# Patient Record
Sex: Female | Born: 1945 | Race: White | Hispanic: No | Marital: Married | Smoking: Current every day smoker
Health system: Southern US, Community
[De-identification: ages and names within clinical notes are randomized; demographics above are authoritative.]

## PROBLEM LIST (undated history)

## (undated) DIAGNOSIS — R32 Unspecified urinary incontinence: Secondary | ICD-10-CM

## (undated) DIAGNOSIS — D3001 Benign neoplasm of right kidney: Secondary | ICD-10-CM

## (undated) DIAGNOSIS — S92901A Unspecified fracture of right foot, initial encounter for closed fracture: Secondary | ICD-10-CM

## (undated) DIAGNOSIS — M549 Dorsalgia, unspecified: Secondary | ICD-10-CM

## (undated) DIAGNOSIS — F488 Other specified nonpsychotic mental disorders: Secondary | ICD-10-CM

## (undated) DIAGNOSIS — I10 Essential (primary) hypertension: Secondary | ICD-10-CM

## (undated) DIAGNOSIS — M170 Bilateral primary osteoarthritis of knee: Secondary | ICD-10-CM

## (undated) DIAGNOSIS — I341 Nonrheumatic mitral (valve) prolapse: Secondary | ICD-10-CM

## (undated) DIAGNOSIS — I951 Orthostatic hypotension: Secondary | ICD-10-CM

## (undated) DIAGNOSIS — I2510 Atherosclerotic heart disease of native coronary artery without angina pectoris: Secondary | ICD-10-CM

## (undated) DIAGNOSIS — J449 Chronic obstructive pulmonary disease, unspecified: Secondary | ICD-10-CM

## (undated) DIAGNOSIS — G8929 Other chronic pain: Secondary | ICD-10-CM

## (undated) DIAGNOSIS — K635 Polyp of colon: Secondary | ICD-10-CM

## (undated) DIAGNOSIS — T8859XA Other complications of anesthesia, initial encounter: Secondary | ICD-10-CM

## (undated) DIAGNOSIS — F419 Anxiety disorder, unspecified: Secondary | ICD-10-CM

## (undated) DIAGNOSIS — G894 Chronic pain syndrome: Secondary | ICD-10-CM

## (undated) DIAGNOSIS — E785 Hyperlipidemia, unspecified: Secondary | ICD-10-CM

## (undated) DIAGNOSIS — D369 Benign neoplasm, unspecified site: Secondary | ICD-10-CM

## (undated) DIAGNOSIS — F17200 Nicotine dependence, unspecified, uncomplicated: Secondary | ICD-10-CM

## (undated) DIAGNOSIS — J690 Pneumonitis due to inhalation of food and vomit: Secondary | ICD-10-CM

## (undated) DIAGNOSIS — R892 Abnormal level of other drugs, medicaments and biological substances in specimens from other organs, systems and tissues: Secondary | ICD-10-CM

## (undated) DIAGNOSIS — Z8673 Personal history of transient ischemic attack (TIA), and cerebral infarction without residual deficits: Secondary | ICD-10-CM

## (undated) DIAGNOSIS — L732 Hidradenitis suppurativa: Secondary | ICD-10-CM

## (undated) DIAGNOSIS — F445 Conversion disorder with seizures or convulsions: Secondary | ICD-10-CM

## (undated) DIAGNOSIS — T4145XA Adverse effect of unspecified anesthetic, initial encounter: Secondary | ICD-10-CM

## (undated) DIAGNOSIS — G039 Meningitis, unspecified: Secondary | ICD-10-CM

## (undated) DIAGNOSIS — D352 Benign neoplasm of pituitary gland: Secondary | ICD-10-CM

## (undated) DIAGNOSIS — M5136 Other intervertebral disc degeneration, lumbar region: Secondary | ICD-10-CM

## (undated) HISTORY — DX: Chronic pain syndrome: G89.4

## (undated) HISTORY — DX: Benign neoplasm, unspecified site: D36.9

## (undated) HISTORY — DX: Unspecified urinary incontinence: R32

## (undated) HISTORY — DX: Hidradenitis suppurativa: L73.2

## (undated) HISTORY — DX: Hyperlipidemia, unspecified: E78.5

## (undated) HISTORY — DX: Unspecified fracture of right foot, initial encounter for closed fracture: S92.901A

## (undated) HISTORY — DX: Abnormal level of other drugs, medicaments and biological substances in specimens from other organs, systems and tissues: R89.2

## (undated) HISTORY — DX: Polyp of colon: K63.5

## (undated) HISTORY — DX: Personal history of transient ischemic attack (TIA), and cerebral infarction without residual deficits: Z86.73

## (undated) HISTORY — DX: Nonrheumatic mitral (valve) prolapse: I34.1

## (undated) HISTORY — DX: Benign neoplasm of pituitary gland: D35.2

## (undated) HISTORY — DX: Atherosclerotic heart disease of native coronary artery without angina pectoris: I25.10

## (undated) HISTORY — DX: Conversion disorder with seizures or convulsions: F44.5

## (undated) HISTORY — DX: Chronic obstructive pulmonary disease, unspecified: J44.9

## (undated) HISTORY — PX: LAMINECTOMY: SHX219

## (undated) HISTORY — DX: Benign neoplasm of right kidney: D30.01

## (undated) HISTORY — DX: Nicotine dependence, unspecified, uncomplicated: F17.200

## (undated) HISTORY — DX: Anxiety disorder, unspecified: F41.9

## (undated) HISTORY — PX: KNEE ARTHROSCOPY: SUR90

## (undated) HISTORY — DX: Orthostatic hypotension: I95.1

## (undated) HISTORY — DX: Bilateral primary osteoarthritis of knee: M17.0

## (undated) HISTORY — DX: Other specified nonpsychotic mental disorders: F48.8

## (undated) HISTORY — DX: Other intervertebral disc degeneration, lumbar region: M51.36

## (undated) HISTORY — DX: Pneumonitis due to inhalation of food and vomit: J69.0

---
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
## 1967-05-25 DIAGNOSIS — F17200 Nicotine dependence, unspecified, uncomplicated: Secondary | ICD-10-CM

## 1967-05-25 HISTORY — DX: Nicotine dependence, unspecified, uncomplicated: F17.200

## 1968-05-24 DIAGNOSIS — G039 Meningitis, unspecified: Secondary | ICD-10-CM

## 1968-05-24 HISTORY — DX: Meningitis, unspecified: G03.9

## 1983-05-25 HISTORY — PX: ABDOMINAL HYSTERECTOMY: SHX81

## 1997-11-12 ENCOUNTER — Emergency Department (HOSPITAL_COMMUNITY): Admission: EM | Admit: 1997-11-12 | Discharge: 1997-11-12 | Payer: Self-pay | Admitting: Emergency Medicine

## 1998-01-10 ENCOUNTER — Encounter: Admission: RE | Admit: 1998-01-10 | Discharge: 1998-04-10 | Payer: Self-pay | Admitting: Anesthesiology

## 1998-01-24 ENCOUNTER — Emergency Department (HOSPITAL_COMMUNITY): Admission: EM | Admit: 1998-01-24 | Discharge: 1998-01-24 | Payer: Self-pay | Admitting: Emergency Medicine

## 1998-01-26 ENCOUNTER — Emergency Department (HOSPITAL_COMMUNITY): Admission: EM | Admit: 1998-01-26 | Discharge: 1998-01-26 | Payer: Self-pay | Admitting: Emergency Medicine

## 1998-02-24 ENCOUNTER — Encounter: Payer: Self-pay | Admitting: Anesthesiology

## 1998-03-28 ENCOUNTER — Emergency Department (HOSPITAL_COMMUNITY): Admission: EM | Admit: 1998-03-28 | Discharge: 1998-03-28 | Payer: Self-pay | Admitting: Emergency Medicine

## 1998-04-26 ENCOUNTER — Emergency Department (HOSPITAL_COMMUNITY): Admission: EM | Admit: 1998-04-26 | Discharge: 1998-04-26 | Payer: Self-pay

## 1998-06-24 ENCOUNTER — Emergency Department (HOSPITAL_COMMUNITY): Admission: EM | Admit: 1998-06-24 | Discharge: 1998-06-24 | Payer: Self-pay | Admitting: Emergency Medicine

## 1998-07-25 ENCOUNTER — Emergency Department (HOSPITAL_COMMUNITY): Admission: EM | Admit: 1998-07-25 | Discharge: 1998-07-25 | Payer: Self-pay | Admitting: Emergency Medicine

## 1998-08-14 ENCOUNTER — Emergency Department (HOSPITAL_COMMUNITY): Admission: EM | Admit: 1998-08-14 | Discharge: 1998-08-14 | Payer: Self-pay | Admitting: Emergency Medicine

## 1998-08-15 ENCOUNTER — Emergency Department (HOSPITAL_COMMUNITY): Admission: EM | Admit: 1998-08-15 | Discharge: 1998-08-15 | Payer: Self-pay

## 1998-10-10 ENCOUNTER — Ambulatory Visit (HOSPITAL_COMMUNITY): Admission: RE | Admit: 1998-10-10 | Discharge: 1998-10-10 | Payer: Self-pay | Admitting: *Deleted

## 1998-10-10 ENCOUNTER — Encounter: Payer: Self-pay | Admitting: *Deleted

## 1998-11-21 ENCOUNTER — Emergency Department (HOSPITAL_COMMUNITY): Admission: EM | Admit: 1998-11-21 | Discharge: 1998-11-21 | Payer: Self-pay | Admitting: Emergency Medicine

## 1998-12-09 ENCOUNTER — Encounter: Payer: Self-pay | Admitting: Emergency Medicine

## 1998-12-09 ENCOUNTER — Emergency Department (HOSPITAL_COMMUNITY): Admission: EM | Admit: 1998-12-09 | Discharge: 1998-12-09 | Payer: Self-pay | Admitting: Emergency Medicine

## 1999-01-05 ENCOUNTER — Emergency Department (HOSPITAL_COMMUNITY): Admission: EM | Admit: 1999-01-05 | Discharge: 1999-01-05 | Payer: Self-pay | Admitting: Emergency Medicine

## 1999-01-10 ENCOUNTER — Emergency Department (HOSPITAL_COMMUNITY): Admission: EM | Admit: 1999-01-10 | Discharge: 1999-01-10 | Payer: Self-pay | Admitting: Emergency Medicine

## 1999-02-25 ENCOUNTER — Inpatient Hospital Stay (HOSPITAL_COMMUNITY): Admission: EM | Admit: 1999-02-25 | Discharge: 1999-02-28 | Payer: Self-pay | Admitting: Emergency Medicine

## 1999-02-25 ENCOUNTER — Encounter: Payer: Self-pay | Admitting: Emergency Medicine

## 1999-03-21 ENCOUNTER — Emergency Department (HOSPITAL_COMMUNITY): Admission: EM | Admit: 1999-03-21 | Discharge: 1999-03-21 | Payer: Self-pay | Admitting: Emergency Medicine

## 1999-05-25 DIAGNOSIS — L732 Hidradenitis suppurativa: Secondary | ICD-10-CM

## 1999-05-25 HISTORY — DX: Hidradenitis suppurativa: L73.2

## 1999-08-04 ENCOUNTER — Encounter: Payer: Self-pay | Admitting: Emergency Medicine

## 1999-08-04 ENCOUNTER — Emergency Department (HOSPITAL_COMMUNITY): Admission: EM | Admit: 1999-08-04 | Discharge: 1999-08-04 | Payer: Self-pay | Admitting: Emergency Medicine

## 1999-12-18 ENCOUNTER — Ambulatory Visit (HOSPITAL_COMMUNITY): Admission: RE | Admit: 1999-12-18 | Discharge: 1999-12-18 | Payer: Self-pay | Admitting: General Surgery

## 1999-12-18 ENCOUNTER — Encounter (INDEPENDENT_AMBULATORY_CARE_PROVIDER_SITE_OTHER): Payer: Self-pay

## 2000-05-24 DIAGNOSIS — I2510 Atherosclerotic heart disease of native coronary artery without angina pectoris: Secondary | ICD-10-CM

## 2000-05-24 DIAGNOSIS — I951 Orthostatic hypotension: Secondary | ICD-10-CM

## 2000-05-24 HISTORY — DX: Atherosclerotic heart disease of native coronary artery without angina pectoris: I25.10

## 2000-05-24 HISTORY — DX: Orthostatic hypotension: I95.1

## 2000-09-07 ENCOUNTER — Encounter: Payer: Self-pay | Admitting: Emergency Medicine

## 2000-09-08 ENCOUNTER — Encounter: Payer: Self-pay | Admitting: Internal Medicine

## 2000-09-08 ENCOUNTER — Inpatient Hospital Stay (HOSPITAL_COMMUNITY): Admission: EM | Admit: 2000-09-08 | Discharge: 2000-09-08 | Payer: Self-pay

## 2000-09-21 ENCOUNTER — Emergency Department (HOSPITAL_COMMUNITY): Admission: EM | Admit: 2000-09-21 | Discharge: 2000-09-21 | Payer: Self-pay | Admitting: Emergency Medicine

## 2000-10-04 ENCOUNTER — Emergency Department (HOSPITAL_COMMUNITY): Admission: EM | Admit: 2000-10-04 | Discharge: 2000-10-04 | Payer: Self-pay | Admitting: Emergency Medicine

## 2000-11-29 ENCOUNTER — Inpatient Hospital Stay (HOSPITAL_COMMUNITY): Admission: EM | Admit: 2000-11-29 | Discharge: 2000-12-01 | Payer: Self-pay | Admitting: Emergency Medicine

## 2000-11-29 ENCOUNTER — Encounter: Payer: Self-pay | Admitting: Emergency Medicine

## 2000-11-29 ENCOUNTER — Encounter: Payer: Self-pay | Admitting: Family Medicine

## 2000-11-30 ENCOUNTER — Encounter: Payer: Self-pay | Admitting: Internal Medicine

## 2000-12-01 ENCOUNTER — Encounter: Payer: Self-pay | Admitting: Family Medicine

## 2001-01-22 ENCOUNTER — Emergency Department (HOSPITAL_COMMUNITY): Admission: EM | Admit: 2001-01-22 | Discharge: 2001-01-22 | Payer: Self-pay | Admitting: Emergency Medicine

## 2001-02-04 ENCOUNTER — Emergency Department (HOSPITAL_COMMUNITY): Admission: EM | Admit: 2001-02-04 | Discharge: 2001-02-04 | Payer: Self-pay | Admitting: Emergency Medicine

## 2001-05-24 DIAGNOSIS — M51369 Other intervertebral disc degeneration, lumbar region without mention of lumbar back pain or lower extremity pain: Secondary | ICD-10-CM

## 2001-05-24 DIAGNOSIS — M5136 Other intervertebral disc degeneration, lumbar region: Secondary | ICD-10-CM

## 2001-05-24 DIAGNOSIS — J449 Chronic obstructive pulmonary disease, unspecified: Secondary | ICD-10-CM

## 2001-05-24 DIAGNOSIS — F488 Other specified nonpsychotic mental disorders: Secondary | ICD-10-CM

## 2001-05-24 HISTORY — DX: Other intervertebral disc degeneration, lumbar region without mention of lumbar back pain or lower extremity pain: M51.369

## 2001-05-24 HISTORY — DX: Chronic obstructive pulmonary disease, unspecified: J44.9

## 2001-05-24 HISTORY — DX: Other specified nonpsychotic mental disorders: F48.8

## 2001-05-24 HISTORY — PX: LUMBAR FUSION: SHX111

## 2001-05-24 HISTORY — DX: Other intervertebral disc degeneration, lumbar region: M51.36

## 2001-05-27 ENCOUNTER — Emergency Department (HOSPITAL_COMMUNITY): Admission: EM | Admit: 2001-05-27 | Discharge: 2001-05-27 | Payer: Self-pay | Admitting: Emergency Medicine

## 2001-05-28 ENCOUNTER — Inpatient Hospital Stay (HOSPITAL_COMMUNITY): Admission: EM | Admit: 2001-05-28 | Discharge: 2001-06-19 | Payer: Self-pay | Admitting: Emergency Medicine

## 2001-05-28 ENCOUNTER — Encounter: Payer: Self-pay | Admitting: Neurology

## 2001-06-02 ENCOUNTER — Encounter: Payer: Self-pay | Admitting: Neurology

## 2001-06-08 ENCOUNTER — Encounter: Payer: Self-pay | Admitting: Neurosurgery

## 2001-06-09 ENCOUNTER — Encounter: Payer: Self-pay | Admitting: Neurology

## 2001-06-11 ENCOUNTER — Encounter: Payer: Self-pay | Admitting: Neurology

## 2001-08-02 ENCOUNTER — Ambulatory Visit (HOSPITAL_COMMUNITY): Admission: RE | Admit: 2001-08-02 | Discharge: 2001-08-02 | Payer: Self-pay | Admitting: Neurosurgery

## 2001-08-02 ENCOUNTER — Encounter: Payer: Self-pay | Admitting: Neurosurgery

## 2001-09-20 ENCOUNTER — Inpatient Hospital Stay (HOSPITAL_COMMUNITY): Admission: EM | Admit: 2001-09-20 | Discharge: 2001-09-22 | Payer: Self-pay | Admitting: Emergency Medicine

## 2001-10-19 ENCOUNTER — Emergency Department (HOSPITAL_COMMUNITY): Admission: EM | Admit: 2001-10-19 | Discharge: 2001-10-19 | Payer: Self-pay | Admitting: Emergency Medicine

## 2001-10-19 ENCOUNTER — Encounter: Payer: Self-pay | Admitting: Emergency Medicine

## 2001-12-20 ENCOUNTER — Ambulatory Visit (HOSPITAL_BASED_OUTPATIENT_CLINIC_OR_DEPARTMENT_OTHER): Admission: RE | Admit: 2001-12-20 | Discharge: 2001-12-20 | Payer: Self-pay | Admitting: General Surgery

## 2001-12-20 ENCOUNTER — Encounter (INDEPENDENT_AMBULATORY_CARE_PROVIDER_SITE_OTHER): Payer: Self-pay | Admitting: Specialist

## 2002-03-03 ENCOUNTER — Emergency Department (HOSPITAL_COMMUNITY): Admission: EM | Admit: 2002-03-03 | Discharge: 2002-03-03 | Payer: Self-pay | Admitting: Emergency Medicine

## 2002-03-03 ENCOUNTER — Emergency Department (HOSPITAL_COMMUNITY): Admission: EM | Admit: 2002-03-03 | Discharge: 2002-03-03 | Payer: Self-pay | Admitting: *Deleted

## 2002-03-04 ENCOUNTER — Inpatient Hospital Stay (HOSPITAL_COMMUNITY): Admission: AD | Admit: 2002-03-04 | Discharge: 2002-03-15 | Payer: Self-pay | Admitting: Neurology

## 2002-03-08 ENCOUNTER — Encounter: Payer: Self-pay | Admitting: *Deleted

## 2002-05-24 HISTORY — PX: CARDIAC CATHETERIZATION: SHX172

## 2002-07-02 ENCOUNTER — Emergency Department (HOSPITAL_COMMUNITY): Admission: EM | Admit: 2002-07-02 | Discharge: 2002-07-02 | Payer: Self-pay | Admitting: Emergency Medicine

## 2002-07-02 ENCOUNTER — Encounter: Payer: Self-pay | Admitting: Emergency Medicine

## 2002-11-28 ENCOUNTER — Inpatient Hospital Stay (HOSPITAL_COMMUNITY): Admission: AD | Admit: 2002-11-28 | Discharge: 2002-12-01 | Payer: Self-pay | Admitting: Pediatrics

## 2002-11-29 ENCOUNTER — Encounter: Payer: Self-pay | Admitting: Pediatrics

## 2002-12-24 ENCOUNTER — Emergency Department (HOSPITAL_COMMUNITY): Admission: EM | Admit: 2002-12-24 | Discharge: 2002-12-24 | Payer: Self-pay | Admitting: Emergency Medicine

## 2002-12-24 ENCOUNTER — Encounter: Payer: Self-pay | Admitting: Emergency Medicine

## 2002-12-25 ENCOUNTER — Emergency Department (HOSPITAL_COMMUNITY): Admission: EM | Admit: 2002-12-25 | Discharge: 2002-12-25 | Payer: Self-pay | Admitting: Emergency Medicine

## 2002-12-26 ENCOUNTER — Emergency Department (HOSPITAL_COMMUNITY): Admission: EM | Admit: 2002-12-26 | Discharge: 2002-12-26 | Payer: Self-pay | Admitting: Emergency Medicine

## 2002-12-26 ENCOUNTER — Encounter: Payer: Self-pay | Admitting: Pediatrics

## 2002-12-26 ENCOUNTER — Ambulatory Visit (HOSPITAL_COMMUNITY): Admission: RE | Admit: 2002-12-26 | Discharge: 2002-12-26 | Payer: Self-pay | Admitting: Pediatrics

## 2002-12-28 ENCOUNTER — Emergency Department (HOSPITAL_COMMUNITY): Admission: EM | Admit: 2002-12-28 | Discharge: 2002-12-28 | Payer: Self-pay | Admitting: Emergency Medicine

## 2002-12-28 ENCOUNTER — Encounter: Payer: Self-pay | Admitting: Emergency Medicine

## 2002-12-29 ENCOUNTER — Emergency Department (HOSPITAL_COMMUNITY): Admission: AD | Admit: 2002-12-29 | Discharge: 2002-12-29 | Payer: Self-pay | Admitting: Emergency Medicine

## 2003-04-03 ENCOUNTER — Emergency Department (HOSPITAL_COMMUNITY): Admission: EM | Admit: 2003-04-03 | Discharge: 2003-04-03 | Payer: Self-pay | Admitting: Emergency Medicine

## 2003-08-06 ENCOUNTER — Emergency Department (HOSPITAL_COMMUNITY): Admission: EM | Admit: 2003-08-06 | Discharge: 2003-08-06 | Payer: Self-pay | Admitting: Emergency Medicine

## 2005-04-30 ENCOUNTER — Emergency Department (HOSPITAL_COMMUNITY): Admission: EM | Admit: 2005-04-30 | Discharge: 2005-04-30 | Payer: Self-pay | Admitting: Emergency Medicine

## 2005-06-23 ENCOUNTER — Encounter: Admission: RE | Admit: 2005-06-23 | Discharge: 2005-06-23 | Payer: Self-pay | Admitting: Orthopedic Surgery

## 2005-09-13 ENCOUNTER — Ambulatory Visit (HOSPITAL_COMMUNITY): Admission: RE | Admit: 2005-09-13 | Discharge: 2005-09-13 | Payer: Self-pay | Admitting: Pediatrics

## 2005-10-07 ENCOUNTER — Emergency Department (HOSPITAL_COMMUNITY): Admission: EM | Admit: 2005-10-07 | Discharge: 2005-10-07 | Payer: Self-pay | Admitting: Emergency Medicine

## 2006-02-24 ENCOUNTER — Emergency Department (HOSPITAL_COMMUNITY): Admission: EM | Admit: 2006-02-24 | Discharge: 2006-02-24 | Payer: Self-pay | Admitting: Emergency Medicine

## 2006-11-22 ENCOUNTER — Ambulatory Visit (HOSPITAL_COMMUNITY): Admission: RE | Admit: 2006-11-22 | Discharge: 2006-11-22 | Payer: Self-pay | Admitting: Family Medicine

## 2007-06-04 ENCOUNTER — Emergency Department (HOSPITAL_COMMUNITY): Admission: EM | Admit: 2007-06-04 | Discharge: 2007-06-04 | Payer: Self-pay | Admitting: Emergency Medicine

## 2007-09-09 ENCOUNTER — Emergency Department (HOSPITAL_COMMUNITY): Admission: EM | Admit: 2007-09-09 | Discharge: 2007-09-10 | Payer: Self-pay | Admitting: Emergency Medicine

## 2007-09-10 ENCOUNTER — Emergency Department (HOSPITAL_COMMUNITY): Admission: EM | Admit: 2007-09-10 | Discharge: 2007-09-10 | Payer: Self-pay | Admitting: Emergency Medicine

## 2007-10-09 ENCOUNTER — Observation Stay (HOSPITAL_COMMUNITY): Admission: EM | Admit: 2007-10-09 | Discharge: 2007-10-10 | Payer: Self-pay | Admitting: Emergency Medicine

## 2007-11-08 ENCOUNTER — Emergency Department (HOSPITAL_COMMUNITY): Admission: EM | Admit: 2007-11-08 | Discharge: 2007-11-08 | Payer: Self-pay | Admitting: Emergency Medicine

## 2008-04-10 ENCOUNTER — Ambulatory Visit (HOSPITAL_COMMUNITY): Admission: RE | Admit: 2008-04-10 | Discharge: 2008-04-10 | Payer: Self-pay | Admitting: Family Medicine

## 2008-04-23 DIAGNOSIS — D369 Benign neoplasm, unspecified site: Secondary | ICD-10-CM

## 2008-04-23 HISTORY — PX: COLONOSCOPY: SHX174

## 2008-04-23 HISTORY — DX: Benign neoplasm, unspecified site: D36.9

## 2008-10-15 ENCOUNTER — Encounter: Admission: RE | Admit: 2008-10-15 | Discharge: 2008-10-15 | Payer: Self-pay | Admitting: Family Medicine

## 2008-10-24 ENCOUNTER — Emergency Department (HOSPITAL_COMMUNITY): Admission: EM | Admit: 2008-10-24 | Discharge: 2008-10-24 | Payer: Self-pay | Admitting: *Deleted

## 2009-07-03 ENCOUNTER — Emergency Department (HOSPITAL_COMMUNITY): Admission: EM | Admit: 2009-07-03 | Discharge: 2009-07-03 | Payer: Self-pay | Admitting: Emergency Medicine

## 2009-07-04 ENCOUNTER — Ambulatory Visit (HOSPITAL_COMMUNITY): Admission: RE | Admit: 2009-07-04 | Discharge: 2009-07-04 | Payer: Self-pay | Admitting: Pediatrics

## 2009-12-26 ENCOUNTER — Emergency Department (HOSPITAL_COMMUNITY): Admission: EM | Admit: 2009-12-26 | Discharge: 2009-12-27 | Payer: Self-pay | Admitting: Emergency Medicine

## 2010-05-24 DIAGNOSIS — S92901A Unspecified fracture of right foot, initial encounter for closed fracture: Secondary | ICD-10-CM

## 2010-05-24 HISTORY — DX: Unspecified fracture of right foot, initial encounter for closed fracture: S92.901A

## 2010-06-14 ENCOUNTER — Encounter: Payer: Self-pay | Admitting: Pediatrics

## 2010-06-17 ENCOUNTER — Encounter
Admission: RE | Admit: 2010-06-17 | Discharge: 2010-06-17 | Payer: Self-pay | Source: Home / Self Care | Attending: Family Medicine | Admitting: Family Medicine

## 2010-08-07 LAB — CBC
Hemoglobin: 13.6 g/dL (ref 12.0–15.0)
MCHC: 34.1 g/dL (ref 30.0–36.0)
RBC: 4.37 MIL/uL (ref 3.87–5.11)
WBC: 8.8 10*3/uL (ref 4.0–10.5)

## 2010-08-07 LAB — DIFFERENTIAL
Basophils Relative: 0 % (ref 0–1)
Lymphocytes Relative: 34 % (ref 12–46)
Lymphs Abs: 3 10*3/uL (ref 0.7–4.0)
Monocytes Relative: 7 % (ref 3–12)
Neutro Abs: 4.4 10*3/uL (ref 1.7–7.7)
Neutrophils Relative %: 51 % (ref 43–77)

## 2010-08-07 LAB — BASIC METABOLIC PANEL
BUN: 28 mg/dL — ABNORMAL HIGH (ref 6–23)
Chloride: 105 mEq/L (ref 96–112)
Creatinine, Ser: 1.62 mg/dL — ABNORMAL HIGH (ref 0.4–1.2)
GFR calc non Af Amer: 32 mL/min — ABNORMAL LOW (ref >60)

## 2010-08-14 LAB — CREATININE, SERUM
Creatinine, Ser: 1.62 mg/dL — ABNORMAL HIGH (ref 0.4–1.2)
GFR calc non Af Amer: 32 mL/min — ABNORMAL LOW (ref >60)

## 2010-08-31 LAB — URINALYSIS, ROUTINE W REFLEX MICROSCOPIC
Bilirubin Urine: NEGATIVE
Ketones, ur: NEGATIVE mg/dL
Nitrite: NEGATIVE
Specific Gravity, Urine: 1.018 (ref 1.005–1.030)
Urobilinogen, UA: 0.2 mg/dL (ref 0.0–1.0)

## 2010-08-31 LAB — CBC
HCT: 40.1 % (ref 36.0–46.0)
Hemoglobin: 13.6 g/dL (ref 12.0–15.0)
WBC: 7.2 10*3/uL (ref 4.0–10.5)

## 2010-08-31 LAB — URINE CULTURE

## 2010-08-31 LAB — URINE MICROSCOPIC-ADD ON

## 2010-08-31 LAB — BASIC METABOLIC PANEL
GFR calc non Af Amer: 43 mL/min — ABNORMAL LOW (ref >60)
Potassium: 3.8 mEq/L (ref 3.5–5.1)
Sodium: 135 mEq/L (ref 135–145)

## 2010-08-31 LAB — DIFFERENTIAL
Eosinophils Relative: 2 % (ref 0–5)
Lymphocytes Relative: 40 % (ref 12–46)
Lymphs Abs: 2.9 10*3/uL (ref 0.7–4.0)
Monocytes Absolute: 0.4 10*3/uL (ref 0.1–1.0)

## 2010-08-31 LAB — POCT CARDIAC MARKERS: Troponin i, poc: <0.05 ng/mL (ref 0.00–0.09)

## 2010-10-06 NOTE — H&P (Signed)
[name] [name], [name] [name]                 ACCOUNT NO.:  [id number]   MEDICAL RECORD NO.:  [id number]          PATIENT TYPE:  OBV   LOCATION:  0103                         FACILITY:  [facility]   PHYSICIAN:  Hollice Espy, M.D.DATE OF BIRTH:  24-Sep-1945   DATE OF ADMISSION:  10/09/2007  DATE OF DISCHARGE:                              HISTORY & PHYSICAL   PRIMARY CARE PHYSICIAN:  Robert L. Foy Guadalajara, M.D.   NEUROLOGIST:  Deanna Artis. Sharene Skeans, M.D.   CHIEF COMPLAINT:  Blurry vision.   HISTORY OF PRESENT ILLNESS:  The patient is a 66 year old white female.  Past medical history of chronic pain, anxiety, arachnoiditis and  hypertension who reports that she slipped and fell 4 days back, hitting  the back of her head. She says that she had a worsening headache since  then.  Note, that she has had a headache since then and she was noting  some blurry vision today when she came in. She describes her pain/her  headache as bitemporal and dull, throbbing and continuous, worsening in  nature up to a 9/10 today and the blurry vision is of more sudden onset  today. She became concerned and came in to the emergency room. In the  emergency room, she was noted initially to have a blood pressure of  167/113 with a heart rate 105. Over time it has trended up to as high as  172/96. A CT scan of the head was unremarkable for anything acute. Her  lab work was drawn and was also found to be completely unremarkable.  Because of the reports of blurry vision and elevated blood pressure, I  felt best that she come in for further evaluation in person.  The  patient herself is feeling quite anxious and nervous about everything  that is going on and she denies any dysphagia, no chest pain,  palpitations and no shortness of breath, wheezing or coughing.  No  abdominal pain.  No hematuria or dysuria, no constipation, diarrhea, no  focal extremity numbness, weakness or pain, but she notes she has  chronic back pain all  throughout her entire back, which has been ongoing  for quite a long time.  Her review of systems otherwise negative.   PAST MEDICAL HISTORY:  1. Hypertension.  2. Arachnoiditis.  3. Anxiety disorder.  4. Dyslipidemia.  5. Depression.  6. Based on her CT, she has an old history of stroke.  7. Tobacco abuse.   MEDICATIONS:  She tells me that she is on following medications:  1. Somewhat 350 p.o. four times a day.  2. Methadone 50 mg p.o. four times a day.  3. Xanax 1 mg p.o. four times a day. These medications she says are      all prescribed by Dr. Sharene Skeans.  4. The rest of medications are done by Dr. Foy Guadalajara. Lisinopril 40 mg      p.o. daily.  5. Another p.r.n. antihypertensive medication for when her systolic      blood pressure gets over 160.   SHE HAS REPORTED ALLERGIES TO TYLENOL, CODEINE, PENICILLIN, SULFA, and  TORADOL.  SOCIAL HISTORY:  She smokes about a little under a pack a day.  Denies  any alcohol or drug use.   FAMILY HISTORY:  Noncontributory.   PHYSICAL EXAMINATION:  VITALS ON ADMISSION: Temperature 98.2, heart rate  105, blood pressure 167/113.  since that time, her blood pressure has  slightly trended up to 172/96 and her  heart rate has come down to 67,  respirations 18, O2 saturation 99% on 2 liters.  She was given a dose of  morphine 4 units, Dilaudid 2 units and Ativan 1 mg and clonidine 0.1 in  the emergency room.  HEENT: Normocephalic, atraumatic.  Her cranial nerves actually appear to  be intact and her vision loss is completely subjective. Her mucous  membranes are slightly dry.  She has no carotid bruits.  HEART:  Regular rhythm, S1-S2.  LUNGS:  Clear to auscultation bilaterally.  ABDOMEN:  Soft, nontender, nondistended.  Positive bowel sounds.  EXTREMITIES:  Show no clubbing, cyanosis or edema.  NEUROLOGIC:  Her neurological examination really shows a diminished  musculoskeletal examination, but again much more subjective. It is  certainly not  focal.  Her grip extension and flexion all appear to be 5-  to a 4+/5, but again is symmetric and more subjective. She has no loss  of sensation.   LAB WORK:  Was sodium of 142, potassium 4, chloride 107, bicarb 29, BUN  not done, creatinine 0.97, glucose of 120. CPK 62.8 and MB less than  less than 0.05. Her BUN was 5.  CT scan of the head shows no acute  abnormality, mild diffuse cortical atrophy,  minimal chronic small  vessel white matter,  ischemic changes in both cerebral hemispheres and  a small old right thalamic lacunar infarct.   PLAN:  1. Hypertensive urgency. Given her previous strokes, do need to take      this seriously.  Will go ahead and check MRI, MRA of the head with      and without contrast and monitor her blood pressure. Will also      gently bring her blood pressure down with clonidine and p.r.n. beta      blocker.  Once MRI is negative, we can more aggressively get on her      blood pressure.  If she has vision changes again, check MRI,  but      exam is inconsistent and may today be from increased blood pressure      versus hysteria component.  2. Tobacco abuse, nicotine patch.  3. Old stroke based on CT.  She needs to be on prophylactic measures.      Will check a fasting lipid profile in the morning and homocysteine      level.  4. Tobacco abuse.  The patient accepts a nicotine patch.  5. Chronic back pain, anxiety.  Will prescribe her medicines as what      the dose she has told me in terms of her Soma, methadone and Xanax      and confirm with Dr. Sharene Skeans in the morning in the office if these      doses are indeed accurate.      Hollice Espy, M.D.  Electronically Signed     SKK/MEDQ  D:  10/09/2007  T:  10/09/2007  Job:  [id number]   cc:   Deanna Artis. Sharene Skeans, M.D.  Fax: [phone]   Doris Cheadle. Foy Guadalajara, M.D.  Fax: [phone]

## 2010-10-06 NOTE — Discharge Summary (Signed)
[name] [name], [name] [name]                 ACCOUNT NO.:  [id number]   MEDICAL RECORD NO.:  [id number]          PATIENT TYPE:  OBV   LOCATION:  1445                         FACILITY:  [facility]   PHYSICIAN:  Hollice Espy, M.D.DATE OF BIRTH:  31-Jan-1946   DATE OF ADMISSION:  10/09/2007  DATE OF DISCHARGE:  10/10/2007                               DISCHARGE SUMMARY   PRIMARY CARE PHYSICIAN:  Dr.Bo Foy Guadalajara   NEUROLOGIST:  Dr. Billie Ruddy.   DISCHARGE DIAGNOSES:  1. Malignant hypertension.  2. 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  3. Documented old cerebrovascular accident, new diagnosis.  4. Tobacco abuse.  5. Chronic pain.  6. Anxiety disorder.   DISCHARGE MEDICATIONS:  New medication for this patient is clonidine 0.1  mg p.o. b.i.d.  The patient is going to continue all of her previous  medications.  These doses are as per what she has told me.  I have  attempted to try to confirm this with Dr. Darl Householder office.  I was  unable to do so.  She tells me that she is on Soma 350 mg p.o. 4 times a  day, methadone 50 mg p.o. 4 times a day, Xanax 1 mg p.o. 4 times a day,  lisinopril 40 mg p.o. daily and aspirin 81 p.o. daily.   HOSPITAL COURSE:  The patient is a 65 year old white female who  presented to the emergency room on Oct 09, 2007 complaining of severe  headache.  She said she had fallen and hit the back of her head 4 days  prior and that her pain continued to worsen during that time.  She said  that she came to the emergency room 4 days later because she was having  severe blurry vision where she felt like she could barely see.  Her exam  was unremarkable, including her eye exam.  A CT scan done of the head  showed evidence of an old thalamic infarct which was for her a new  diagnosis, and her blood pressure was noted to be in the 170s.  The  patient was given doses of pain and antihypertensive medications and her  blood pressure has since improved.  In regard to her hypertension, the  plan will be to continue her on clonidine 0.1 p.o. b.i.d. as well as her  lisinopril.  In regard to her previous stroke seen on CT, I discussed  this with the patient and advised her now that she needs to be on a  heart-healthy diet and needs to be compliant with her medications,  making sure that her blood pressure stays steady, continue her aspirin  81 mg.  A fasting lipid profile was checked, where she was found to have  an LDL above 120.  I advised the patient that she needs to talk to her  PCP, Dr. Foy Guadalajara, about the possibility of  starting on a cholesterol  medication such as a statin.  She does note  that she will indeed  discuss this with him.  In regard to the patient's  visual disturbance,  the patient had a normal eye exam.  Her pupils were equal and reactive  and respond to light.  Her extraocular muscles appeared to be intact and  her vision exam was completely subjective.  When I went to see her on  day of discharge, Oct 10, 2007, and walked into the room she had her  eyes partially closed and said that she could not see anything.  She  acted as if my voice sounded familiar, however, I noted that when I was  leaving the room, that when her husband had come in before he had even  said anything, she acknowledged his presence when he walked in but  without him making any noise or greeting her.  Therefore, I truly feel  that her vision is more of a conversion disorder or somatization  reaction and not true vision loss by any means.  Initially I theorized  that it may be from elevated blood pressure but this does not appear to  be the case.  She also may have some secondary gain or malingering  reasons for this.  Nevertheless, after speaking with [facility] with my findings they felt that this was certainly non-  urgent and pending a normal MRI, recommended that the if patient  continues to  complain of vision issues that she may follow up with them  in the office.  In regard to the patient's chronic pain and anxiety  issues, we continued her on all of her pain and anxiety medications.  I  attempted to contact Dr. Darl Householder office to confirm doses, however, I  was unable to get hold of this information.  The patient's overall  disposition from initial presentation is improved in terms of her blood  pressure.   DISCHARGE DIET:  Heart-healthy diet.   ACTIVITY:  Is advised slow to increase and if she does continue to  complain of vision loss, to walk with assistance although again, I  question whether or not this is true vision loss.  The patient is being  discharged to home.  We are still waiting for her MRI results and once  we have these, then if they are negative for acute findings, will  discharge the patient to home.      Hollice Espy, M.D.  Electronically Signed     SKK/MEDQ  D:  10/10/2007  T:  10/10/2007  Job:  [id number]   cc:   Deanna Artis. Sharene Skeans, M.D.  Fax: [phone]

## 2010-10-09 NOTE — Consult Note (Signed)
[facility]  Patient:    [name] [name], [name] [name]                          MRN: [id number] Proc. Date: 08/04/99 Adm. Date:  [id number] Disc. Date: [id number] Attending:  Osvaldo Human CC:         Marinda Elk, M.D., [facility] @ [facility]                          Consultation Report  CHIEF COMPLAINT:  Chest pain.  HISTORY OF PRESENT ILLNESS:  Ms. Huettner states that yesterday she had onset of dizziness, lightheadedness followed by left breast chest pain described as sharp in nature.  She describes it as a 7-8/10.  This occurs upon rest.  Eating, motion,  palpation, nothing seemed to make it better or worse.  She denied any shortness of breath, diaphoresis, or radiation of this.  The patient had an ECG which is unremarkable.  It showed no difference in ECG on February 25, 1999.  The patient hen had laboratories including a CMET and a CBC, which were unremarkable.  Also, CPK MB with a troponin, which was normal.  The patients vitals:  The patient was noted  to have a blood pressure initially of 188/104 and then another blood pressure of 179/85.  The patient was afebrile, temperature was 98.0, respiratory rate of 17, pulse 66 and regular.  PAST MEDICAL HISTORY:  The patient has a past medical history of admission for chest pain in October 4, of 2000.  The patient had a similar presentation. [facility] was consulted and the patient had a catheterization done.  This showed RCA with a 60% lesion.  Left main was okay.  LAD was 30% and circumflex was 25%. This was felt to be a noncritical lesion and the patient pain was not felt to be related to this as this was a resting pain.  The patient had a history of mitral valve prolapse.  Also has a history of spinal arachnoiditis.  She also has a history of panic attacks for which she takes Xanax.  Her spinal arachnoiditis and narcotics are managed by Dr. Fransisca Connors,  neurologist.  MEDICATIONS: 1. Methadone 10 mg three times a day. 2. Soma 350 mg three times a day. 3. Xanax 0.5 mg b.i.d. p.r.n. 4. Toprol XL 50 mg a day. 5. Norvasc 5 mg a day. 6. Neurontin 100 mg three times a day, which she has not taken in a long time.  The patient, unfortunately, has not been able to take her blood pressure medicine because of financial issues.  She has not been able to pay for this.  This is the reason why her blood pressure is quite high today.  ALLERGIES:  PENICILLIN, SULFA, CODEINE, ASPIRIN, and TORADOL.  PAST SURGICAL HISTORY:  Status post hysterectomy for endometriosis and three back surgery, status post appendectomy.  FAMILY MEDICAL HISTORY:  Father who had two MIs and a CABG in 1988.  Brothers who each have had heart attacks.  The patient mother had atrial fibrillation requiring cardioversion.  One of three sisters has had an MI.  The patients paternal grandfather died shortly after a cardiac endarterectomy.  SOCIAL HISTORY:  The patient is married and disabled.  She lives with her husband. Smokes one-pack of cigarettes a day.  Denies alcohol or recreational drug use.   REVIEW OF SYSTEMS:  Otherwise negative.  PHYSICAL EXAMINATION:  HEENT:  Normal.  GENERAL:  Prior to the patients being here, the patient was given some morphine  and the patients pain went down considerably.  HEART:  Regular rate and rhythm.  Normal S1, S2 without murmurs, rubs or gallops.  LUNGS:  Clear to auscultation bilaterally.  ABDOMEN:  Soft and reactive bowel sounds.  Nontender.  No organomegaly, masses,  bruits.  RECTOVAGINAL:  Examination is deferred.  EXTREMITIES:  The patient has no clubbing, cyanosis, or edema and pulses are 2+  intact.  Chest x-ray which was normal as well.  ASSESSMENT: 1. Resting chest pain, previous unremarkable catheterization in October of 2000.    ECG unchanged.  CPK, troponin unchanged is likely noncardiac as with  chest pain    this long her enzymes would have bumped.  This may be related to spinal    arachnoiditis, maybe related to panic attacks.  The patient has has similar    episodes with panic attacks and has not had a Xanax in a while. 2. Hypertension, uncontrolled due to noncompliance of medicines.  The patient needs    to get back on medicines.  PLAN:  Plan for this patient is to give Xanax 0.5 mg b.i.d. p.r.n., a total of 0, no refills.  Have the patient see Dr. Foy Guadalajara next week.  Also, resume Toprol XL 50 mg a day, Norvasc 5 mg a day and ask social work to help with this. DD:  08/04/99 TD:  08/04/99 Job: [id number]

## 2010-10-09 NOTE — H&P (Signed)
[place]  Patient:    [name] [name], [name] [name] Visit Number: [id number] MRN: [id number]          Service Type: MED Location: 3000 [id number] Attending Physician:  Erich Montane Dictated by:   Genene Churn. Love, M.D. Admit Date:  05/28/2001   CC:         Marinda Elk, M.D.  Hewitt Shorts, M.D.   History and Physical  PATIENT ADDRESS: [address], [place], [place] [address]  DATE OF BIRTH: 20-Aug-1945  CHIEF COMPLAINT: This 65 year old right-handed married white female was seen in the emergency room and admitted for evaluation of intractable lower back and right leg pain.  HISTORY OF PRESENT ILLNESS: Ms. Hout indicates a history of pain beginning in 1976, for which she underwent a right lumbar laminectomy.  She subsequently the next year, 1977, underwent repeat laminectomy by Dr. Lillia Mountain at [facility], and then had a third laminectomy in 1995 by Dr. Shirlean Kelly.  Her surgeries apparently have all been at the L4-L5 level and her pain has always been in the lower back radiating down the right leg, with numbness along the top of her right foot.  She has been on a methadone pain control management system for the last three years.  She has been seen by a chronic pain therapist, Dr. Thyra Breed in [place], [place], and has undergone epidural steroid injections and what sounds to have been sympathetic nerve blocks without any benefit and with increasing pain at the time of each procedure.  She has known arachnoiditis.  She underwent a recent MRI study in late December 2002 showing evidence of arachnoiditis and degenerative disk disease with disk bulge at the 4-5 level and foraminal narrowing on the right at the 4-5 level.  In discussion with Dr. Noreene Filbert on May 27, 2001 by telephone the family was told that it would be reasonable to obtain a repeat neurosurgical consult through Dr. Shirlean Kelly.   The patient complained of severe pain since this past Wednesday and has taken 28 out of 30 Percocet in addition to her methadone 10 mg t.i.d.  She was seen yesterday at [facility] and treated with a pain shot of Dilaudid and Phenergan, but today complains of severe pain and comes to the emergency room.  She complains of pain that is aggravated by stooping, bending, and coughing, in the right hip region and radiating down the right leg and L5 dermatomal distribution, with numbness along the dorsal aspect of her right foot.  She has had this pain for approximately 11 years and has gotten very little relief.  She has had occasional bowel incontinence.  The last episode was approximately three weeks ago and prior to that six to eight months ago.  She states that she is really never improved in terms of her symptomatology.  She had shingles in 1999, which was associated with numbness along the bottom of both feet.  PAST MEDICAL HISTORY:  1. Lumbar laminectomy in 1976, 1977, and 1995 at the L4-L5 level.  2. Hysterectomy in 1983 with one ovary removal.  3. Appendectomy at age 43.  4. Tonsillectomy at age 11.  5. Broken left arm in 31.  6. She has had hypertension for approximately two years.  CURRENT MEDICATIONS:  1. Methadone 10 mg t.i.d.  2. Soma one q.i.d.  3. Neurontin 900 mg q.h.s.  4. Lisinopril, dosage unknown, one q.d.  5. Percocet, of which she took  28 since this past Wednesday.  SOCIAL HISTORY: She has been independent with activities of daily living but rarely drives a car.  She finished three years of college.  She has been disabled since 89.  She continues to smoke cigarettes, one pack per day. She does not drink alcohol.  FAMILY HISTORY: Her mother is 67 and her Dad is 81.  She has a brother, 60, and a sister, 38, living and well.  She has one daughter, 39, living and well.  ALLERGIES:  1. PENICILLIN.  2. SULFA.  3. CODEINE.  4.  TORADOL.  PHYSICAL EXAMINATION:  GENERAL: Well-developed white female, who got out of the examining room several times during her long stay in the emergency room to smoke cigarettes.  VITAL SIGNS: Blood pressure in right and left arm 160/80.  Heart rate 100.  NEUROLOGIC: No bruits.  Neck flexion and extension maneuvers unremarkable. She really had negative straight-leg-raise in a sitting position but complained of pain in straight-leg-raise test in the lying position on both the right and in the left leg.  Her neurologic examination revealed on mental status she was alert and oriented x 3, followed three-step commands.  Cranial nerve examination revealed visual fields to be full.  EOMI.  Discs flat. Spontaneous venous pulsations seen.  Mild bilateral ptosis.  Tongue was midline.  Uvula midline.  Gags present.  Sternocleidomastoid and trapezius testing normal.  Motor examination revealed give-away strength testing in the right leg, which included the anterior and potassium tibialis, gastrocnemius, and EHL and anterior tibialis.  These were very hard to evaluate.  I could not be certain whether or not they were weak.  Her sensory examination revealed subjective decreased pinprick and vibration below the right knee, again not following a definite dermatome.  Her deep tendon reflexes showed absent ankle jerks.  She did have knee jerks and reflexes in the upper extremities.  Her gait examination revealed an antalgic gait.  She could stand on her toes.  She could stand on her heels, and actually got her toes off the floor while standing on her heels.  HEENT: Tympanic membranes clear.  NECK: Thyroid not enlargement.  CHEST: Lungs clear to auscultation in posterior chest region.  HEART: No murmurs.  BREAST: Normal.  EXTREMITIES: No clubbing, cyanosis, or edema seen in the extremities.  LABORATORY DATA: Still pending at this time.  IMPRESSION:   1. Low back pain and right leg pain,  code 724.4 and 729.5.  2. Right L5 radiculopathy, code 353.4.  3. Hypertension, code 796.2.  4. Status post lumbar laminectomy x 3.  PLAN: The plan at this time is to admit the patient for pain control and have a neurosurgery consultation with Dr. Newell Coral regarding consideration of any further appropriate therapy. Dictated by:   Genene Churn. Love, M.D. Attending Physician:  Erich Montane DD:  05/28/01 TD:  05/29/01 Job: [id number]

## 2010-10-09 NOTE — Discharge Summary (Signed)
NAME:  [name] [name], [name] [name]                           ACCOUNT NO.:  [id number]   MEDICAL RECORD NO.:  [id number]                   PATIENT TYPE:  INP   LOCATION:  3021                                 FACILITY:  [facility]   PHYSICIAN:  Deanna Artis. Sharene Skeans, M.D.           DATE OF BIRTH:  09/10/1945   DATE OF ADMISSION:  11/28/2002  DATE OF DISCHARGE:  12/01/2002                                 DISCHARGE SUMMARY   FINAL DIAGNOSES:  1. Intractable low back pain.  2. Arachnoiditis.  3. Hypertension.  4. Depression and anxiety.   PROCEDURES:  CT scan of the lumbosacral spine.   COMPLICATIONS:  None.   HISTORY OF PRESENT ILLNESS:  The patient is a 65 year old woman with four  back surgeries dating back to 70.  The patient has chronic back pain with  a severe burning neuritic component that intermittently worsens.   See admission history and physical note for details.   The patient was admitted because she had intractable pain unresponsive to  outpatient therapy and because she had fallen several times at home.  She  has osteoporosis and is at great risk for fracture.   She is admitted for diagnostic evaluation and attempts to adjust her  medications to allow her to resume care in a home setting.   HOSPITAL COURSE:  The patient was admitted with a low-grade fever.  The  patient had a few white blood cells in her urine.  Urine culture at this  time is pending as of the morning of discharge.  The patient has not had  significant dysuria, and her temperature has returned to normal.   The patient was unable to sleep and was quite tearful.  We increased her  oral methadone from 40 to 50 mg q.6h. and changed it to q.6h. regimen from  q.i.d.  When she goes home, she will be advised to spread it throughout the  day and into the night-time.   We also started Trileptal which was not tolerated at a dose of 300 mg twice  a day.  We will cut back to 150 mg twice a day and try to increase the dose.  The patient complained of abdominal pain and vomited.  She refused to take  the medication after that time, but will try it at a lower dose.   The patient was not placed on IV therapy because in the past it has not  benefitted her.  She was seen by physical therapy who recommended a  wheelchair for safety at home, and also home health PT three times per week.  No other changes were made in the medications.   PHYSICAL EXAMINATION TODAY:  VITAL SIGNS:  Blood pressure 107/55, resting  pulse 58, respirations 18, temperature 98.8, O2 saturation 95%.  GENERAL:  The patient is awake, pleasant.  She has slept last night for the  first time in quite some time, and actually  had to be awakened to take her  medication.  She has improved to the point where it would be safe to send  her home.  NECK:  Supple.  No infection.  HEAD:  No infection.  LUNGS:  Clear.  HEART:  No murmurs, pulses normal.  ABDOMEN:  Soft, nontender, bowel sounds normal.  EXTREMITIES:  Normal, however, she had significant pain as she has in the  past with manipulation of her legs that tends to radiate to the low back  region.  The patient sits in a semi-flexed position on her side which is a  position of least pain for her.  NEUROLOGIC:  Mental status:  Awake and alert.  Less depressed, pleasant,  oriented in all spheres with normal language.  Cranial nerves are normal.  Motor examination:  Normal of the upper extremities, give-away strength  proximally of the lower extremities, normal distally.  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  I do not see signs of  radiculopathy.  She has straight leg raising that is positive to 5 degrees.  Reflexes were normal at biceps, triceps, and patella, decreased at the  brachioradialis, absent at the ankles.  The patient had bilateral flexor  plantar responses.   PLAN:  1. The patient will be discharged home on Lisinopril 10 mg daily, tizanidine     4  mg twice daily, carisoprodol 350 mg four times daily,     hydrochlorothiazide 25 mg every other day, Xanax 1 mg twice daily,     Trileptal 150 mg twice daily (new medication), methadone 10 mg five     tablets four times daily, Ambien 10 mg as needed for insomnia.  2. The patient has received a durable good lightweight wheelchair which she     is to use when she is traveling any distance and when she is home alone.  3. She is to walk with assistance as much as possible daily, and receive     home physical therapy three times weekly.  4. She will return to see me in my office in one month's time.  Telephone     number [phone].  5. We will attempt to increase Trileptal through the month as she is able to     tolerate it.   LABORATORY DATA:  White blood cell count 5600, hemoglobin 11.5, hematocrit  33.8, MCV 89.1, platelet count 255,000, 55 polys, 35 lymphs, 7 monos, 3  eosinophils, 1 basophil.  Sedimentation rate slightly elevated at 34,  nonspecific.   Comprehensive metabolic:  Sodium 137, potassium 4.4, chloride 104, CO2 29,  glucose 92, BUN 7, creatinine 1.1, calcium 9.2, total protein 6, albumin low  at 2.9, AST 15, ALT 8, ALP 170, slightly elevated, nonspecific.  Total  bilirubin 0.4.  Urinalysis showed specific gravity 1.011, pH 6.5.  Chemistries negative except for leukocyte esterase which was small, 3 to 6  white blood cells, 0 to 2 red blood cells, rare bacteria.  Cultures at this  time pending.   CT scan of the lumbar spine without contrast demonstrated pedicle screw and  rod fixation and interbody spacers at the L4-5 level with normal alignment  with solid bony fusion at that level, and marked disk space narrowing on the  right.  There is extensive soft tissue density within the spinal canal at  that level consistent with arachnoiditis, this obscured the thecal sac.  The patient had small calcific bony densities in the anterior aspect of  the  canal on the right, and the  medial aspect of the neural foramen on the right  at that level.  A small calcific density was seen at the posterior aspect of  the thecal sac at the L5-S1 region.  These were all unchanged from previous  studies.   Reconstruction of the images from the upper portion of L3 through S1 showed  normal alignment.  Bilateral laminectomy defects were demonstrated at the L4-  L5 level with posterior bony fragmentation bilaterally, and narrowing at the  L4-L5 level on the right.  Plain films of the lumbar spine again showed five  non-rib-bearing lumbar  vertebrae, pedicle screw and rod fixation at L4-5, interbody spacer fusion  demonstrated at that level with normal alignment, mild anterior spur  formation at multiple levels, a bone island in the medial aspect of the left  iliac bone also unchanged.                                               Deanna Artis. Sharene Skeans, M.D.    [facility]  D:  12/01/2002  T:  12/02/2002  Job:  [id number]

## 2010-10-09 NOTE — Discharge Summary (Signed)
[name] [name]. [facility]  Patient:    [name], [name] Visit Number: [id number] MRN: [id number]          Service Type: MED Location: 2000 2020 01 Attending Physician:  Enos Fling Dictated by:   Marlan Palau, M.D. Admit Date:  09/20/2001 Discharge Date: 09/22/2001   CC:         [facility], [address]   Discharge Summary  DISCHARGE DIAGNOSES: 1. Multiple syncopal events, rule out seizures. 2. History of chronic pain syndrome with recent lumbosacral spine surgery. 3. History of hypertension.  DISCHARGE DIAGNOSES: 1. Psychogenic syncopal episodes. 2. Hypertension. 3. Chronic pain syndrome.  PROCEDURES: 1. EGD study. 2. Cardiac monitoring.  COMPLICATIONS:  Above procedures, none.  HISTORY OF PRESENT ILLNESS:  [name] [name] is a 65 year old, right-handed, white female with a history of spinal arachnoiditis with chronic pain syndrome.  The patient has recently undergone a lumbosacral spine surgery and has continued to have ongoing pain.  She has a history of hypertension.  The patient began experiencing frequent syncopal events beginning the day prior to this admission.  The events became fairly frequent at the time of admission.  The patient passed out without warning, collapsed, fell to the floor.  No jerking, twitching, eye rolling noted.  No bowel or bladder incontinence or tongue biting noted.  The patient was awakened without postictal confusion.  The patient has had occasional syncopal events in the past.  The patient was brought in for further evaluation.  PAST MEDICAL HISTORY: 1. History of syncope as above. 2. Spinal arachnoiditis. 3. Chronic pain syndrome. 4. Status post lumbosacral spine surgery x 3. 5. History of hypertension.  MEDICATIONS PRIOR TO ADMISSION: 1. Lisinopril 10 mg daily. 2. Hydrochlorothiazide 12.5 mg every other day. 3. Xanax 0.5 mg 1 three times a day. 4. Neurontin 300 mg  1 in the morning, 600 mg in the evening. 5. Soma 350 mg 1 three times a day. 6. Methadone 20 mg 1 four times a day.  DIET:  No added salt.  ALLERGIES:  PENICILLIN, SULFA DRUGS, CODEINE, DARVON.  HABITS:  The patient smokes one-half pack of cigarettes a day, does not drink alcohol.  Please refer to History & Physical for patients Social History, Family History, Review of Systems, and physical examination.  LABORATORY DATA:  Notable for a CPK of 54.  Sodium 143, potassium 4.2, chloride 113, CO2 25, glucose 106, BUN 10, creatinine 0.8, calcium 9.7, total protein 6.4, albumin 3.5, AST 23, ALT 13, alkaline phosphatase 134, total bilirubin 0.7.  Troponin I 0.01.  White count 6.0, hemoglobin 13.6, hematocrit 39.8, MCV 86.8, platelets 263.  Prolactin level 2.0.  EKG reveals normal sinus rhythm, voltage criteria for LVH, rate 61.  HOSPITAL COURSE:  This patient was admitted for evaluation of multiple syncopal events.  Psychogenic syncope was strongly suspected prior to admission.  This patient has undergone cardiac monitoring, has had at least two to three episodes of syncope on the cardiac monitor without change in heart rhythm, blood pressure, vital signs.  The patient was set up for an EEG study which was performed and was completely normal.  The patient claims that she has had at least one syncopal episode while the EEG was hooked up.  No abnormalities were seen during the study.  The patient was felt to have psychogenic syncope events.  I discussed the potential for having a psychiatrist evaluate her.  Ms. Borowski does not wish to have psychiatric evaluation at this point.  Will discharge the patient to home at this point.  DISCHARGE MEDICATIONS: 1. Lisinopril 10 mg 1 a day. 2. Hydrochlorothiazide 12.5 mg 1 every other day. 3. Xanax 0.5 mg 1 three times a day. 4. Neurontin 300 mg 1 in the morning, 600 mg in the evening. 5. Soma 350 mg 1 three times a day. 6. Methadone 20 mg 1 four  times a day.  No-added salt diet.  FOLLOWUP:  The patient will follow up with Dr. Noreene Filbert within six weeks following discharge.  If the patient desires to have a psychiatry evaluation, she is to contact our office. Dictated by:   Marlan Palau, M.D. Attending Physician:  Enos Fling DD:  09/22/01 TD:  09/25/01 Job: [id number]

## 2010-10-09 NOTE — Consult Note (Signed)
[name] [name]  Patient:    [name], [name] Visit Number: [id number] MRN: [id number]          Service Type: MED Location: 3000 3023 01 Attending Physician:  [name] [name] Dictated by:   [name] [name], M.D. Proc. Date: 05/29/01 Admit Date:  05/28/2001   CC:         [name] [name], M.D.   Consultation Report  HISTORY OF PRESENT ILLNESS:  The patient is a 65 year old right-handed white female who is a former patient of mine and who I last saw in November of 1997. She has had difficulty with chronic right lumbar radicular pain and has been followed for many years by [name] [name], M.D.  She had previous right L4-5 diskectomies in 1977 by Dr. Lillia [name] at [facility] and by myself in April of 1997.  She has been followed by [name] [name] at [name] in the past and subsequently coming under the care of Dr. Noreene [name].  She has been treated with methodone apparently 10 mg t.i.d., Neurontin 100 mg in the morning and 900 mg q.h.s., and Seldone one tablet q.i.d.  She apparently did undergo a single epidural steroid injection by Dr. Vear [name] about a year or two ago, but did not have a good experience with that.  The cause of her chronic right lumbar radicular pain is severe lumbar arachnoiditis which is due to her Pantopaque myelograms in the 1970s.  Her chronic radiculopathy had been stable up until around Thanksgiving when she fell at home and she has been having worse pain since that time.  She had been working with Dr. Noreene [name] on adjusting her medication regimen.  She was recently started on prednisone.  She had an MRI scan done three days ago, but because of severe pain and the inability to manage it at home, she was admitted to the [name] last night by Dr. Avie [name] for pain control.  Neurosurgical consultation was requested by Dr. Sandria [name] and Dr. Noreene [name].  PAST  MEDICAL HISTORY:  Notable for history of hypertension treated for the past couple of years.  PAST SURGICAL HISTORY:  Lumbar laminectomies and diskectomies in 1976, 1977, and 1997.  Hysterectomy in 1993 with one oophorectomy, appendectomy, and tonsillectomy as an adolescent.  ALLERGIES:  PENICILLIN, SULFA, CODEINE, TORADOL.  MEDICATIONS:  In addition to those listed above include Zestril and Percocet which she was started on last week.  SOCIAL HISTORY:  The patient is married.  She has been disabled for 11 years. She smokes a pack a day.  She does not drink alcoholic beverages.  REVIEW OF SYSTEMS:  Notable for that described in her history of present illness, past medical history.  Otherwise unremarkable.  PHYSICAL EXAMINATION:  GENERAL:  The patient is a well-developed, well-nourished white female who appears her stated age.  VITAL SIGNS:  Temperature 97.9, pulse 70, blood pressure 137/78, and respiratory rate 20.  NEUROLOGICAL:  The patient is awake, alert, and fully oriented.  Follows commands.  Her speech is fluent and she has good comprehension.  Motor examination shows 5/5 strength in the left dorsiflexor, extensor hallucis longus, and plantar flexor.  However, in testing the distal right lower extremity her effort is limited due to pain.  Her leg becomes tremulous as she tries to give full effort.  The strength is at least 4/5 in the dorsiflexor, extensor hallucis longus, and plantar flexor, but full accurate testing is not pheasable due to  the pain that limits her effort.  Sensory examination shows intact sensation to pinprick through the distal left lower extremity, but there is diminished sensation to pinprick in the lateral aspect of the right leg and the entire right foot both medially and laterally. Reflexes are 1 at the quadriceps bilaterally as well as the left gastrocnemius, but right gastrocnemius is absent.  Toes are downgoing bilaterally.  Gait and stance  were not tested.  DIAGNOSTIC STUDIES:  Five-view lumbosacral spine x-rays done last night at [name] show osteoporosis as well as marked narrowing at the L4-5 disk space level.  MRI shows degenerative changes both at the L4-5 and L3-4 levels.  At L4-5, there is disk space narrowing and some spondylytic spurring.  At L3-4, there is circumferential degenerative disk bulging. There is also postsurgical changes on the right side at L4-5 including a laminotomy and mild epidural scarring.  However, the most notable finding is that of severe lumbar arachnoiditis that extends from L3 to S1.  Notably, there is no evidence of nerve root or thecal sac compression.  The nerve roots exit from the spinal canal through the foramina at each level.  There is no evidence of disk herniation.  IMPRESSION:  Right lumbar radicular pain both chronic with acute exacerbation as well secondary to a fall around Thanksgiving.  The radiculopathy is due to arachnoiditis which is of longstanding duration for over 25 years.  There is no evidence of neural compression either within the spinal canal or foramina, although, there are certainly degenerative changes present.  RECOMMENDATIONS:  There is no role for surgical intervention at this time. She will need continued medical management with adjustments to her current regimen.  She may also benefit from consulting from another pain clinic such as the [name] which includes Dr. Daphine [name], Dr. Marlynn [name], and their partners, particularly regarding options for other interventions.  I have had the opportunity to discuss all of this with the patient and her husband.  I have had an opportunity to review her films with her husband and their questions were answered.  Her ongoing care is referred to Dr. Noreene [name]. Dictated by:   [name] [name], M.D. Attending Physician:  [name] [name] DD:  05/29/01 TD:  05/30/01 Job: [id number]

## 2010-10-09 NOTE — Discharge Summary (Signed)
[place]  Patient:    [name] [name] Visit Number: [id number] MRN: [id number]          Service Type: MED Location: 3000 3023 01 Attending Physician:  Erich Montane Dictated by:   Candy Sledge, M.D. Admit Date:  05/28/2001 Discharge Date: 06/19/2001   CC:         Marinda Elk, M.D.  Julio Sicks, M.D.   Discharge Summary  HISTORY OF PRESENT ILLNESS:  For complete details of chief complaint, history of the present illness, past medical history, and physical examination, please refer to Dr. Genene Churn. Loves admission history and physical.  Briefly, [name] [name] is a 65 year old married female with a history of lumbar spine surgery x3 in 1975, 1977, and 1995.  All these surgeries were at the L4-5 level.  She has had a long history of chronic pain syndrome related to spinal arachnoiditis involving bilateral leg pain.  More recently, she has presented with complaints of intractable right leg pain and numbness suggestive of an L5 radiculopathy.  It was initially felt that she was not a good surgical candidate, and her pain was being managed as an outpatient with increasing doses of narcotic pain medications.  Finally on May 28, 2001, the patient presented to the emergency room with intractable low back and right leg pain and was admitted by Dr. Sandria Manly.  Her admitting diagnosis was low back pain and right leg pain, right L5 radiculopathy, hypertension, and status post lumbar laminectomy x3.  MEDICATIONS:  Her medications on admission included methadone 20 mg p.o. t.i.d., Soma one p.o. t.i.d., Zestril 40 mg po b.i.d., aspirin 81 mg per day, Neurontin 100 mg p.o. q.a.m. and 600 mg p.o. q.h.s., HCTZ 25 mg every other day, and Percocet as needed.  LABORATORY DATA:  The patients CBC and differential showed white cell count of 3.9 thousand, hemoglobin 15.1 and hematocrit 43.7.  Platelet count was 264,000.  There were 56% neutrophils, 34%  lymphocyte, 5% monocytes.  A repeat CBC on January 15 showed white cell count 11.4 thousand, this was 8.9 thousand on January 16, and then 11.9 thousand on January 19.  Sedimentation rate was normal at 3 mm/hr.  PT and PTT were 11.6 and 25 seconds, respectively. Comprehensive metabolic panel on admission showed glucose 114, alkaline phosphatase 154 u/L, and total bilirubin was low at 0.2 mg/dl.  However, the indices were within normal limits.  A repeat basic metabolic panel was generally within normal limits on three separate occasions.  Blood cultures drawn on June 11, 2001, showed no growth in five days.  Chest x-ray on May 28, 2001, showed COPD without acute abnormality.  Lumbar spine films showed degenerative disk disease changes at L4-5 without acute abnormality.  PICC line was placed on January 16 under ultrasound.  Repeat chest x-ray on June 08, 2001, showed cardiomegaly, COPD, and lingular subsegmental atelectasis.  Repeat chest x-ray on January 19 showed again cardiomegaly without evidence for acute pulmonary disease.  Twelve-lead EKG January 16 showed normal sinus rhythm and was otherwise unremarkable.  HOSPITAL COURSE:  The patient was admitted to the neurosciences unit.  Vital signs were really quite stable throughout her hospitalization.  Blood pressures occasionally were as high as a systolic of 170 and diastolics in the 90s.  Normally, blood pressure was well-controlled.  Her p.o. intake was rather spotty.  Initially, the patient continued to complain of severe back and right leg pain unrelieved by high doses of narcotic medications.  She was seen  by Hewitt Shorts, M.D., for her surgical consultation, and Dr. Newell Coral did not feel that further surgery on the patients back would be beneficial and carried some risks.  The patient was then referred for epidural steroid injections by Kristine Royal, M.D.  This provided minimal, if any, relief.  Continuing to  complain of significant pain with decreased mobility, it was felt that a second neurosurgical opinion might be helpful.  Julio Sicks, M.D., was asked to see the patient on January 14.  He felt that she had a chance of responding well to further surgery and recommended a rather extensive procedure.  The patient thought about this for the next several days and in consultation with her husband and the medical team decided to go ahead with surgery.  This was carried out on June 09, 2001, consisting of a re-exploration of the L4-5 laminectomy with bilateral L4-5 microdiskectomy, L4-5 PLIF with Tangent wedges and local autografts, and L4-5 posterolateral fusion with pedicle screws and local autograft.  The patient tolerated the procedure well in the immediate time frame.  She did complain of quite a bit of back pain and anterior thigh pain following surgery, but this improved with time, pain control, and slow mobilization.  She was initially on a PCA pump with morphine, then switched to Demerol or Dilaudid.  On January 27, vital signs were stable.  The patient continued to complain of a pain level of 8-1/2 or 9/10 but was up ambulating.  She had experienced some hallucinations on intravenous Demerol.  At that point, her right leg was feeling all right and her left leg was "bothersome."  It was felt that she had arrived at maximum hospital benefit and was discharged to home.  FINAL DIAGNOSES: 1. Status post L4-5 microdiskectomy and L4-5 posterior lumbar interbody fusion    with Tangent wedges and L4-5 posterolateral fusion with pedicle screws per    Dr. Julio Sicks. 2. Chronic pain syndrome. 3. Spinal arachnoiditis, status post lumbar spine surgery x3 since 1976. 4. Hypertension.  DISCHARGE MEDICATIONS: 1. Methadone 20 mg p.o. t.i.d. 2. Demerol 50 mg as needed for breakthrough pain. 3. Zestril, home dose. 4. Neurontin, home dose. 5. Hydrochlorothiazide q.o.d. 6. Soma one p.o.  t.i.d.   DISPOSITION:  The patient is discharged to home with instructions to follow up with Dr. Jordan Likes and Dr. Noreene Filbert as scheduled.  CONDITION ON DISCHARGE:  Stable.  PROGNOSIS:  Fair. Dictated by:   Candy Sledge, M.D. Attending Physician:  Erich Montane DD:  08/03/01 TD:  08/04/01 Job: [id number]

## 2010-10-09 NOTE — Op Note (Signed)
NAME:  [name] [name], [name] [name]                           ACCOUNT NO.:  [id number]   MEDICAL RECORD NO.:  [id number]                   PATIENT TYPE:  MS   LOCATION:                                       FACILITY:  [facility]   PHYSICIAN:  Glenna Fellows, M.D.             DATE OF BIRTH:  10-21-1945   DATE OF PROCEDURE:  12/20/2001  DATE OF DISCHARGE:  10/19/2001                                 OPERATIVE REPORT   PREOPERATIVE DIAGNOSES:  Chronic hidradenitis of the perineum.   POSTOPERATIVE DIAGNOSES:  Chronic hidradenitis of the perineum.   OPERATION PERFORMED:  Excision of chronic hidradenitis, right perineum.   SURGEON:  Lorne Skeens. Hoxworth, M.D.   ANESTHESIA:  Laryngeal mask general.   INDICATIONS FOR PROCEDURE:  [name] [name] is a 65 year old white female with a  long history of recurrent hidradenitis involving the groins and perineum.  She has had excisions in the past.  She now has an area of chronic recurrent  infection and drainage in the right posterior perineum.  There were no other  major active areas present.  This has been persistently symptomatic over  several months with recurrent infections and I&Ds.  We have elected to  locally excise this area.  The nature of the procedure, its indications and  risks of bleeding, infection, and prolonged wound healing have been  discussed and understood.  She is now brought to the operating room for this  procedure.   DESCRIPTION OF PROCEDURE:  The patient was brought to the operating room and  placed in supine position on the operating table and laryngeal mask general  anesthesia was induced.  She was carefully positioned in the lithotomy  position and the perineum sterilely prepped and draped.  She received  preoperative broad spectrum antibiotics.  The area of concern was about 3 cm  to the right and just posterior to the vaginal introitus.  There was an area  of chronic induration and slight erythema measuring about 1.5 x 0.5 cm.  An  elliptical excision oriented vertically was performed back to healthy skin  and this was carried down into the deep subcutaneous back to normal tissue  and the area completely excised.  Hemostasis was obtained with cautery.  The  wound was then closed with interrupted mattress sutures of 4-0 nylon.  There  was no significant tension.  A tiny pustule in the left perineum which she  had pointed out preoperatively was unroofed.  Dry sterile dressings were  applied.  The soft tissue had been infiltrated with 0.5% Marcaine with  epinephrine.  Sponge, needle and instrument counts were correct.  The  patient was then transferred to recovery in good condition.  Glenna Fellows, M.D.    Jane Canary  D:  12/20/2001  T:  12/23/2001  Job:  [id number]

## 2010-10-09 NOTE — Discharge Summary (Signed)
[place]  Patient:    [name] [name], [name] [name]                          MRN: [id number] Adm. Date:  [id number] Disc. Date: [id number] Attending:  Rollene Rotunda Dictator:   Brita Romp, P.A. CC:         Marinda Elk, M.D., [facility], [address], [place], [place] [address]             Rollene Rotunda, M.D. [facility]   Discharge Summary  DISCHARGE DIAGNOSES: 1. Hypotension, secondary to beta-blocker overdose. 2. Chest pain, status post cardiac catheterization in October 2000 with mild    disease, preserved left ventricular function. 3. History of reflex sympathetic dystrophy. 4. History of arytenoiditis.  ADMISSION HISTORY:  The patient presented to the emergency room on April 17 for evaluation of chest pain and hypotension.  She apparently developed chest pain after taking labetalol which had been started the prior evening.  The morning of April 17, she had taken an additional 200 mg of labetalol and has had continuous chest pain ever since.  HOSPITAL COURSE:  She was seen and admitted by Dr. Lewayne Bunting.  He noted in the emergency room her heart rate was depressed into the 40s with a systolic blood pressure of 100 going down as low as 70 systolic.  He also felt that the patient was seeking narcotics for her pain.  He felt that the patients chest pain was very typical of cardiac ischemia; however, he ordered cardiac enzymes as well as a spiral CT to rule out pulmonary embolus.  He started the patient on IV dopamine to support a blood pressure and held her beta-blocker, ACE inhibitors, and pain medicines.  The following day, the patient was seen by Dr. Huston Foley.  The patient continued to complain of her chest pain which Dr. Tenny Craw felt was pleuritic in nature.  She noted that the patients blood pressure was 115/78 with a pulse rate in the 70s.  Cardiac enzymes at this time were negative for MI.  With the improved blood pressure, she discontinued the  patients dopamine.  Her plan was to continue to observe the patient off dopamine and to watch as medicines were resumed.  At approximately 9:30 on the evening of April 18, the patient insisted on leaving against medical advice.  Her husband had brought her home medications in, and she had taken Xanax, Haldol, and Soma from these as well as her hospital medications.  The standard hospital form was filled out and signed, and the patient left against medical advice.  LABORATORY AND X-RAY DATA:  White count 4.6, hemoglobin 10.9, hematocrit 32.9, RDW 16.3, platelets 230.  Sodium 140, potassium 3.7, chloride 111, CO2 27, BUN 15, creatinine 0.9, glucose 78, total protein 6.1, albumin 3.0, AST 31, ALT 35, alkaline phosphatase 123, total bilirubin 0.5.  Peak cardiac enzymes were in the morning of April 18 with a CK of 436, MB fraction of 17.8 which revealed index of 4.1; however, troponin I was less than 0.01.  Spiral CT of the chest revealed no pulmonary embolus. DD:  09/15/00 TD:  09/18/00 Job: [id number]

## 2010-10-09 NOTE — Discharge Summary (Signed)
NAME:  LATESSA, TILLIS                           ACCOUNT NO.:  [id number]   MEDICAL RECORD NO.:  [id number]                   PATIENT TYPE:  INP   LOCATION:  3036                                 FACILITY:  [facility]   PHYSICIAN:  Candy Sledge, M.D.            DATE OF BIRTH:  10-08-1945   DATE OF ADMISSION:  03/04/2002  DATE OF DISCHARGE:  03/15/2002                                 DISCHARGE SUMMARY   For complete details of chief complaint, history of present illness, past  medical history and physical examination, please refer to Dr. Porfirio Mylar  Dohmeier's admission history and physical.  Briefly, the patient is a 65-  year-old patient with a history of multiple lumbar spine surgeries and  lumbar spinal arachnoiditis.  She has chronic pain syndrome treated with  relatively high  dose narcotic medications.  Periodically, the patient  experiences flare ups of her pain requiring p.r.n.  medications.  She also  has a history of labile hypertension and history of syncope of unclear  etiology.   On 03/04/02, the patient presented for admission due to marked exacerbation  of her leg pain with burning features.  This could not be managed at home  and the patient was admitted for further evaluation and treatment.   LABORATORY DATA:  CBC and differential showed a white cell count 8.5,000,  hemoglobin 13.3, hematocrit 39.7.  Platelet count 256,000.  There were 78%  neutrophils, 18%  lymphocytes, 4% monocytes.  Sedimentation rate was 18 mm  per hour.  Comprehensive metabolic panel showed glucose 784, albumin 3.2,  ALT 118 units per liter and all other indices were within normal limits. CK  level was 250 units per liter.  Lumbar spine CT without contrast on 03/05/02  showed postoperative fusion changes at L4-5 with no evidence of bony  encroachment at this level and the pedicle screws are in good position.  There is a marked amount of soft tissue density material at that level  obscuring the  thecal sac.  Determination of recurrent herniation was not  possible at that level due to disk material which is consistent with  previous surgery.  There are probable dystrophic calcifications and no  evidence of bony encroachment at L4-5 levels.  On 03/08/02, the patient  underwent epidural steroid injection with epidurography.  This was  technically successful procedure.   HOSPITAL COURSE:  The patient was admitted to neurosciences unit.  Vital  signs remained quite stable throughout this hospitalization with  intermittent low grade temperatures.  Blood pressures were periodically  elevated in the range of 160 systolic, but were normally within acceptable  ranges.  The patient continued to complain bitterly of a burning type  causalgic-type pain in her lower extremities.  She underwent the above noted  studies without problems.  There were adjustments in her pain medications  including the addition of Procardia for possible reflex sympathetic  dystrophy and at  one point she was placed on Tegretol for treatment of  neuropathic pain.  She seemed to develop  lethargy and diarrhea on this  medication which was discontinued.  She required continued treatment with  methadone and p.r.n.  IV Morphine.  This did control the pain to some  extent.  She was seen by Dr. Antonietta Breach psychiatric consultation and  his Axis I diagnosis was posttraumatic stress disorder with partial  remission and depressive disorder not otherwise specified.  The patient was  placed on Celexa and increased dose of Remeron at night.   The patient underwent epidural steroid injection and this seemed to result  in some mild improvement in her discomfort.  What brought about greatest  relief, however, was an increase in her dose of methadone to 50 mg p.o.  t.i.d.  After one or two days on this dose, the patient  was up ambulating,  working well with physical therapy and felt ready for discharge.   FINAL DIAGNOSES:   1. Lumbar spinal stenosis, exacerbation.  2. History of multiple lumbar spine surgeries.  3. Chronic pain syndrome with narcotic dependence.  4. History of labile hypertension.  5. History of depression.   DISCHARGE MEDICATIONS:  1. Lisinopril and HCTZ , home doses.  2. Soma one p.o. t.i.d.  3. Xanax 1 mg p.o. b.i.d.  4. Remeron 15 mg p.o. q.h.s.  5. Methadone 10 mg five at 6 a.m., 5 at  2 p.m., and 5 tablets at 10 p.m.  6. Neurontin 600 mg one p.o. t.i.d.  7. MS Contin 30 mg one twice a day.  8. MSIR 5 mg one twice a day as needed for breakthrough pain.   DISPOSITION:  The patient was discharged to home.  Instructions for followup  with Dr. Noreene Filbert in four to six weeks.  Ideally, she will benefit from  referral to a pain clinic.   CONDITION ON DISCHARGE:  Improved.   PROGNOSIS:  Fair.                                                Candy Sledge, M.D.    JJS/MEDQ  D:  05/16/2002  T:  05/18/2002  Job:  [id number]   cc:   Antonietta Breach, M.D.  [address]  [place], [place] [address]  Fax: [phone]

## 2010-10-09 NOTE — Op Note (Signed)
[facility]  Patient:    [name] [name], [name] [name]                          MRN: [id number] Proc. Date: 12/18/99 Adm. Date:  [id number] Disc. Date: [id number] Attending:  Glenna Fellows [place]                           Operative Report  PREOPERATIVE DIAGNOSIS: 1. Chronic hidradenitis suppurativa, right labia majora. 2. Sebaceous cyst lower abdominal wall.  PROCEDURE: 1. Excision hidradenitis, right labia majora. 2. Excision sebaceous cyst anterior abdominal wall.  SURGEON:  Dr. Johna Sheriff.  ANESTHESIA:  Laryngeal mask general.  BRIEF HISTORY:  Ms. Ridgely is a 65 year old white female with recurring chronic problems of hidradenitis suppurativa involving her perineum and inguinal areas. She has had excisions in the past. She now has some chronic areas of inflammation induration along the lateral aspect of the right labium majora which causes her chronic pain. On presentation for surgery today, she also has an enlarging tender sebaceous cyst on her lower anterior abdominal wall measuring about 2 cm that she desires to have excised as is markedly symptomatic. She is now brought to the operating room for excision of these areas. The nature of the procedure and risks of bleeding, infection and recurrence were discussed and understood preoperatively.  DESCRIPTION OF PROCEDURE:  The patient was brought to the operating room and placed in supine position on the operating table and laryngeal mask general anesthesia was induced. Cipro was given intravenously. The patient was carefully positioned in stirrups and the abdomen and perineum were sterilely prepped and draped. There was an approximately 5-6 cm long x 1 to 1.5 cm wide area of induration and chronic inflammation along the lateral aspect of the right labium. This was elliptically excised full thickness back to normal skin. Hemostasis was obtained with the cautery. The wound was closed with running 4-0 nylon. A  separate area more posteriorly was acutely inflamed with some slight drainage measuring about 2 cm and this was elliptically excised through a separate incision and similarly closed. The sebaceous cyst on her anterior abdominal wall was then excised with an elliptical transverse excision completely removing it. It was closed with an identical closure. Sponge, needle and instrument counts were correct. Dry sterile dressings were applied. The patient was taken to recovery in good condition. DD:  12/18/99 TD:  12/21/99 Job: [id number]

## 2010-10-09 NOTE — H&P (Signed)
NAME:  [name] [name], [name] [name]                           ACCOUNT NO.:  [id number]   MEDICAL RECORD NO.:  [id number]                   PATIENT TYPE:  INP   LOCATION:  3036                                 FACILITY:  [facility]   PHYSICIAN:  Melvyn Novas, M.D.               DATE OF BIRTH:  February 16, 1946   DATE OF ADMISSION:  03/04/2002  DATE OF DISCHARGE:                                HISTORY & PHYSICAL   HISTORY OF PRESENT ILLNESS:  The patient is a 65 year old, Caucasian, right-  handed female, married in her third marriage who has had multiple visits to  the emergency room today and the two prior days and was admitted for  evaluation of progressive and intractable lower back pain and bilateral leg  pain with a burning sensation.  The patient had threatened verbally during  more than eight of her phone messages that she would commit suicide if her  pain treatment would not be continued in the hospital.  It was understood  that the patient has been in the past admitted for prolonged hospital stays,  intractable pain and has not benefitted from IV or in hospital care to the  degree that she found acceptable.  Her husband, who also brought her to the  ER, is visibly frustrated and appears very angry.  The patient herself is  very tearful, but now after her direct admission to the floor appears  entirely different and wants to leave the room to smoke.   The patient has a long history of unsuccessful pain treatment beginning in  1976, when she had the first right lumbar laminectomy.  She had a repeat in  1995, by Dr. Newell Coral and in January 2003, by Dr. Julio Sicks with a fixation  of her lumbar spine at L4-L5.  The pain has never left her.  She states that  she felt sometimes initially after surgery better, but in review of her  medication, it is hard to see where there was ever a pain free period.  The  patient has been meanwhile placed on methadone for pain control and she had  been seen by a chronic  pain therapist, Dr. Thyra Breed, who gave her  epidural steroid injections and sympathetic nerve blocks were also  attempted.  The patient states that they made her pain worse.  She has a  known history of arachnoiditis which led to her Disability being granted in  1993.  The patient had continued to take large doses of Percocet at home.  She continued with methadone 10 mg t.i.d. and occasionally q.i.d. and then  was seen at the [facility] on Wednesday, Friday and  at [facility] on Saturday.  The patient complaints of a variety of pain  qualities including burning sensation, dull ache, sore, throbbing  sensations, numbness and mentions multiple times the term of reflux  sympathetic dystrophy for which she claims  she has been diagnosed by Dr.  Noreene Filbert.  I later obtained a phone conversation with Dr. Noreene Filbert prior to  this admission and the patient is not diagnosed with RSD nor was Dr. Noreene Filbert  able to find the specific symptoms leading to that diagnosis that he ruled  actively out last week.  The patient has a history of shingles with zoster  impairing the bottom of both feet.   PAST MEDICAL HISTORY:  1. Lumbar diskectomy in 1976, 1977, 1995, 2003, with fusion.  2. Hysterectomy in 1983.  3. Appendectomy age 75.  4. Tonsillectomy age 86.  5. Broken left arm in 3.  6. Hypertension for approximately 3-1/2 years.   ALLERGIES:  The patient states that she is intolerant of BETA-BLOCKERS.   MEDICATIONS:  1. Methadone 10 mg t.i.d.  2. Soma one q.i.d.  3. Neurontin 1200 mg t.i.d.  4. Lisinopril.  5. Percocet.   SOCIAL HISTORY:  She is remarried for the third time.  She states she has  been disabled since 1991, but Disability was fully granted in 1993, and she  received since then a Social Security payment.  She continues to smoke  cigarettes in large amounts with at least one pack per day.  She denies any  alcohol use.  She uses large amounts of prescription  pain killers.  The  patient has not been driving for the last three years.  She has one adult  daughter who is 69.   FAMILY HISTORY:  Her mother is 67.  Her dad is 51.  There are brother and  sisters living and well.   PHYSICAL EXAMINATION:  GENERAL:  Well-developed, Caucasian female seen  sitting in a wheelchair, which she can leave to smoke cigarettes.  The  patient appears twitchy, restless, agitated and anxious.  VITAL SIGNS:  Blood pressure 140/80, heart rate 98.  LUNGS:  Rales bilaterally in the lungs.  ABDOMEN:  Normal.  EXTREMITIES:  No peripheral edema in the upper extremities.  Mild peripheral  edema in lower extremities.  Significant erythematous changes of the palm  and plantar pedis.  In general, it appears puffy.  NEUROLOGIC:  Cranial nerves 2-12 grossly intact.  Extraocular movements  full.  Discs flat.  Gaze intact.  Tongue and uvula midline.  Motor exam  shows give away strength testing in the lower extremities bilaterally.   ASSESSMENT:  The patient is difficult to exam.  The physical exam,  especially with motor strength, is not reciprocal and shows only limited  correlation to the anatomical nerve distribution.  Equal tone and mass  bilaterally.  No atrophy.                                               Melvyn Novas, M.D.    CD/MEDQ  D:  03/13/2002  T:  03/13/2002  Job:  [id number]

## 2010-10-09 NOTE — Consult Note (Signed)
[place]. [facility]  [name]:    [name] [name], [name] [name] Visit Number: [id number] MRN: [id number]          Service Type: MED Location: 3000 [id number] Attending Physician:  Erich Montane Dictated by:   Julio Sicks, M.D. Admit Date:  05/28/2001                            Consultation Report  REQUESTING PHYSICIAN:  Candy Sledge, M.D.  HISTORY OF PRESENT ILLNESS:  [name] [name] is a 65 year old female who first developed lumbar problems in [name] mid 1970s.  She developed a right-sided L5 radiculopathy secondary to a right-sided L4-5 disk herniation and underwent a lumbar Pantopaque myelogram, followed by a right-sided L4-5 laminotomy and diskectomy in 1976.  [name] procedure was complicated postoperatively by pain and postural headaches.  [name] [name] was subsequently evaluated by Dr. Merrily Pew at [facility] and she underwent a redo diskectomy on [name] right side at L4-5 with much better pain relief.  She was placed into a program of rehabilitation and was able to regain functionality with minimal complaints of lingering back pain.  Following this, [name] [name] was able to be gainfully employed and was off all medication.  [name] [name] was working in [place] in 1991 when, while bending forward walking under a ledge, she had [name] onset of recurrent back pain and lower extremity pain.  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 [name] [name] was refused surgery at this point secondary to insurance issues.  [name] [name] has been essentially in unremitting pain since this point.  [name] patients back and leg pain persisted for [name] next six years.  At that point she was evaluated by Dr. Shirlean Kelly.  Dr. 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  [name] [name] underwent another right-sided L4-5 laminotomy and redo diskectomy in  1997.  [name] [name] reported that her pain overall improved following this although was still quit significant.  [name] [name] was managed with long-term narcotics, including methadone, but she was living at home and relatively functional.  Around Thanksgiving 2002 [name] [name] suffered a fall and since that time has had worsening of her back pain and pain into her right buttocks.  [name] pain radiates down [name] posterolateral aspect of her right thigh and lateral aspect of her right leg, and into [name] dorsum of her right foot.  [name] [name] has complained of dysesthesias and paresthesias involving [name] distribution of this pain.  She is unable to stand or bear weight on this leg for any length of time.  She required inpatient admission for pain control on May 28, 2001. [name] [name] has been treated with an epidural steroid injection with some improvement of her dysesthetic pain in her foot but no change in her severe back, buttock, thigh, and leg pain.  [name] [name] is still essentially completely bedridden secondary to this pain.  All attempts at mobilization have been unsuccessful.  [name] [name] is requiring continuous Dilaudid for pain control.  She remains on Neurontin and methadone, as well.  She has been treated with oral steroids without improvement.  She is also receiving Soma.  Currently, [name] [name] continues to complain of severe right lower extremity pain.  She states this is much greater in character than [name] pain that she had been dealing with previously.  She denies any left lower extremity pain.  She is having no bowel or bladder complaints.  PAST MEDICAL HISTORY:  Notable for hypertension and chronic pain, as well as her aforementioned lumbar surgeries.  CURRENT MEDICATIONS:  Neurontin, methadone, lisinopril, Xanax, prednisone in [name] form of a prednisone Dosepak, Zanaflex, Soma, Neurontin, and Phenergan as needed, as well as Dilaudid.  ALLERGIES:  SULFA MEDICATIONS, TORADOL,  PENICILLIN, and CODEINE.  FAMILY HISTORY:  Noncontributory.  SOCIAL HISTORY:  [name] [name] is married.  She is disabled.  She is a smoker, smoking one pack of cigarettes per day.  REVIEW OF SYSTEMS:  Negative for any cardiac, respiratory, genitourinary, or other musculoskeletal complaints.  PHYSICAL EXAMINATION:  GENERAL:  She is a thin, middle-aged white female who is obviously in a great deal of discomfort.  She is tearful and quite emotional during [name] evaluation.  HEENT:  Demonstrates no evidence of significant abnormality.  NECK:  Has a full active range of motion.  There is no evidence of carotid bruit.  Airway is midline.  CHEST:  Clear to auscultation.  HEART:  Regular rate and rhythm.  ABDOMEN:  Benign.  EXTREMITIES:  Reveal no evidence of injury or deformity.  GENITOURINARY AND RECTAL:  Deferred.  NEUROLOGIC:  [name] [name] is awake and alert.  She is oriented and appropriate. Cranial nerve function is intact.  Motor examination is difficult secondary to pain into her right lower extremity.  [name] [name] does have some giveaway weakness in her right lower extremity but this is difficult to evaluate secondary to poor effort and tremulousness but does appear to have some element of right-sided dorsiflexion weakness involving her right-sided anterior tibialis and extensor hallucis longus.  Remainder of her motor strength is intact.  Sensory examination reveals some decreased sensation to pinprick and light touch with some dysesthetic sensation in her right-sided L5, and to a lesser extent S1 dermatome.  Deep tendon reflexes are normoactive in both patellae.  They are hypoactive on [name] right side at [name] Achilles and hypoactive on [name] left side at [name] Achilles, but somewhat more prominently so on [name] right side.  There is no evidence of a ______.  Gait is impossible to test secondary to extreme pain.  LABORATORY DATA:  I reviewed patients MRI scan of her lumbar spine  from January 3.  This demonstrates evidence of a wide right-sided L4-5 laminotomy  with what appears to be a fracture through [name] pars interarticularis and some high signal change within [name] joint space at L4-5.  [name] disk space at L4-5 is quite collapsed.  There are no significant modic changes in [name] vertebral bodies, however.  [name] exiting neural foramen at L4 are collapsed bilaterally. There is evidence of what appears to be either a recurrent disk herniation off to [name] right side at L4-5 or exuberant scar tissue formation on [name] right side at L4-5.  Either way, this is causing compression of [name] right-sided L5 nerve root and right lateral aspect of [name] thecal sac.  There is no evidence of obvious pseudomeningocele formation.  She does have evidence of nerve root clumping from L2 to S1 consistent with arachnoiditis.  There is evidence of high signal within [name] thecal sac consistent with retained Pantopaque from her previous myelogram.  There are no obvious degenerative changes, disk herniations, or other levels of stenosis in [name] remainder of her spine.  IMPRESSION:  I believe this [name] is suffering [name] symptoms of a rather severe right-sided L5 radiculopathy.  I believe this right-sided  L5 radiculopathy is secondary to a recurrent disk herniation off to [name] right side at L4-5 coupled with segmental instability at [name] L4-5 level worsening her overall symptoms.  [name] [name] does have evidence of arachnoiditis, most likely secondary to her previous Pantopaque myelogram in [name] mid 1970s. Attributing all this patients pain to arachnoiditis is difficult, considering that she almost certainly had arachnoiditis during [name] last 1970s and 1980s without significant radicular complaints.  I think most likely her radicular complaints are due to both [name] presence of a compressive lesion from this recurrent disk herniation on [name] right side at L4-5 and structural instability at [name] L4-5  level.  I think that her options for further management are either:  1. Continued attempts at pain management, either as an inpatient or with an outside pain management center.  I think this is unlikely to give a satisfactory result, however.  2. I think that any thought of redo microdiskectomy alone is almost certainly going to wind up in failure.  3. I think [name] [name] may respond to aggressive decompressive surgery at L4-5 by doing a complete laminectomy with wide foraminotomies along [name] course of [name] L4 and L5 nerve roots, followed by an L4-5 posterior lumbar interbody fusion utilizing tangent wedges and local autograft, coupled with posterolateral fusion utilizing pedicle screws fixation and local autograft to hopefully both decompress her thecal sac and nerve roots, as well as stabilize this L4-5 motion segment.  I have discussed [name] risks and benefits involved with this procedure, including but not limited to, [name] risks of anesthesia, bleeding, infection, CSF leak, nerve root injury, fusion failure, instrumentation failure, continued pain, and non-benefit.  [name] [name] and her husband have been given [name] opportunity to ask questions and appear to understand.  They would like some further time to consider their options.  They would like to consult back with Dr. Noreene Filbert.  I will continue to follow this [name] while she is an inpatient.  I very much appreciate [name] consultation. Dictated by:   Julio Sicks, M.D. Attending Physician:  Erich Montane DD:  06/06/01 TD:  06/07/01 Job: [id number]

## 2010-10-09 NOTE — H&P (Signed)
[place]. [facility]  Patient:    [name] [name], [name] [name]                          MRN: [id number] Adm. Date:  [id number] Attending:  Lenora Boys CC:         Rollene Rotunda, M.D. [facility]  Candy Sledge, M.D.   History and Physical  DATE OF BIRTH:  January 06, 1946.  PROBLEM:  Hypotensive shock.  HISTORY OF PRESENT ILLNESS:  A 65 year old married white female fell outside the surgical center today, where she was visiting her mother-in-law.  She was taken to Dr. Joya San office because of weakness, falling to the side, and was found to have severe orthostatic hypotension and a left knee injury.  Dr. Noreene Filbert sent her to the emergency room and requested that I manage her medical situation.  Patient has had two prior episodes of severe hypotension on April 17 and May 1, which required emergency room visits, with a hospitalization on the first visit.  She had been started on labetalol prior to the first one and had restarted beta blockers at the second episode.  She specifically denies beta blocker use today.  Her husband corroborates that she has been given no beta blockers and has not taken any medication other than her standard medications. Patient is somewhat confused and disoriented due to the shock, however.  Patient has had recent cardiac workup by Dr. Antoine Poche with negative Cardiolite, ejection fraction 65%, and normal echocardiogram without mitral valve prolapse.  Patient does complain bitterly of pain, specifically her chronic low back pain and some left knee pain which is new since she fell today.  She specifically denies chest pain.  PAST MEDICAL HISTORY: 1. Hypertension with intermittent hypotensive episodes. 2. Recurrent atypical chest pain with negative cardiac workup. 3. 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 4. Anxiety disorder, with suspicion of panic disorder, controlled on Xanax. 5.  Hidradenitis suppurativa. 6. Chronic renal insufficiency with last creatinine clearance 64 in 2000.  PAST SURGICAL HISTORY:  Status post hysterectomy, status post appendectomy, status post back surgery x 3.  MEDICATIONS:  Methadone 10 mg two t.i.d., Soma 350 mg one q.i.d., Lorcet p.r.n. pain, Neurontin 300 mg a.m. and 900 mg p.m., Xanax 0.5 mg t.i.d., Zestril 20 mg two q.a.m., HCTZ 25 mg one-half q.a.m.  ALLERGIES:  PENICILLIN, SULFA, CODEINE, ASPIRIN, TORADOL, and REMERON.  SPECIALISTS:  Candy Sledge, M.D., for neurology.  Rollene Rotunda, M.D., for cardiology.  Lorne Skeens. Hoxworth, M.D., for general surgery.  SOCIAL HISTORY:  Smokes one pack per day.  Denies ETOH or recreational drugs.  REVIEW OF SYSTEMS:  Unobtainable secondary to mental confusion.  PHYSICAL EXAMINATION:  VITAL SIGNS:  Blood pressure 64/30 on my arrival, 76/40 before starting dopamine.  Pulse 80.  Respirations 12.  GENERAL:  The patient is conscious but confused.  She had pale face but flushing in upper chest and neck.  HEENT:  ENT exam normal.  NECK:  Supple.  Carotids with normal upstroke, 2+ pulsation, no bruit.  CHEST:  Lungs clear to auscultation.  CARDIAC:  Heart rate regular though inappropriately slow at 80, considering her hypotension.  No murmur, rub, or gallop is heard.  ABDOMEN:  Soft, no hepatosplenomegaly.  GENITALIA:  Scarring on the right from previous surgery, and an acute right posterior vulvar cyst, which is only slightly inflamed.  EXTREMITIES:  Swelling and tenderness anterior left  knee, an abrasion of the right elbow, and a brace on the right ankle secondary to a sprain three weeks ago.  DIAGNOSTIC STUDIES:  X-ray of left knee reveals no fracture, with moderate effusion.  X-ray of chest shows no significant change other than a possible infiltrate, right upper lobe, compared with April 17 x-ray.  Laboratory reveals normal white count 9.1, hemoglobin slightly low at  11.8, platelets normal at 248.  Urinalysis is entirely negative.  BMET shows normal electrolytes but BUN up at 29, creatinine 2.7.  Drug screen positive for methadone and benzodiazepines, both of which are therapeutic agents for this patient.  Negative for all other drugs of abuse.  ASSESSMENT: 1. Severe, unexplained hypotension. 2. Acute left knee injury, probably contusion versus ligamentous injury. 3. Chronic low back pain with history of spinal arachnoiditis and reflex    sympathetic dystrophy. 4. Chronic methadone use. 5. Renal insufficiency, acutely worse since April 17, creatinine has risen    from 0.9 to 2.7.  PLAN: 1. Aggressive hydration with normal saline. 2. Dopamine drip for blood pressure support. 3. Judicious morphine for pain control. 4. Resume methadone and Xanax as patient is stable. 5. Hold Zestril and hydrochlorothiazide until blood pressure recovers. 6. Have asked Dr. Antoine Poche to see patient for consideration of acute    echocardiogram to rule out transient wall motion abnormalty/depressed    cardiac function as cause of her hypotensive episodes.  I have also asked    for drug screen for beta blocker to rule out inadvertent or intentional use    of beta blockers, which were presumed cause of her previous hypotensive    episodes. DD:  11/29/00 TD:  11/30/00 Job: [id number]
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
## 2010-10-09 NOTE — H&P (Signed)
[facility]  Patient:    [name] [name], [name] [name] Visit Number: [id number] MRN: [id number]          Service Type: EMS Location: ED Attending Physician:  Ilene Qua Dictated by:   Currie [place], M.D. Admit Date:  02/04/2001   CC:         [name] [name] T. Hoxworth, M.D.   History and Physical  EMERGENCY ROOM NOTE  CHIEF COMPLAINT:  Abscess.  HISTORY OF PRESENT ILLNESS:  [name] is a 65 year old lady who has had excision of a wide area of hidradenitis of the right inguinal groin area by Dr. Johna Sheriff who has had recurring little infections that are either anterior or just posterior to the area that was previously excised.  Every time she gets these they seem to diminish before she can get in to see Dr. Johna Sheriff and he is never quite sure exactly where these are.  She is developing another one and she states that her agreement with Dr. Johna Sheriff was that they would not have any more I&D but at some point do a re-excision but that she wanted to be seen today by me so that we could document exactly where it is.  She is otherwise well.  PHYSICAL EXAMINATION:  On exam, she has a well-healed scar paralleling the labia just adjacent or lateral to the labia.  Just posterior to the scar by about two fingerbreadths and slightly medial to the scar is an area of induration and firmness that it is a little bit tender.  There is not a lot of erythema.  There is a "blackhead" that is adjacent, and the area in question is actually just one fingerbreadth anterior and medial to the blackhead.  The patient has noted another area that is anterior to the prior scar but today I do not really feel a mass there.  IMPRESSION:  Probably some residual hidradenitis with recurrent mild inflammation.  PLAN:  She is going to continue to do soaks and says these tend to resolve on their own without any further treatment but was thinking she needed to have further follow-up with Dr.  Johna Sheriff and perhaps a reexcision.  She will contact him on Monday for that follow-up. Dictated by:   Currie [place], M.D. Attending Physician:  Ilene Qua DD:  02/04/01 TD:  02/04/01 Job: [id number]

## 2010-10-09 NOTE — H&P (Signed)
NAME:  [name] [name], [name] [name]                           ACCOUNT NO.:  [id number]   MEDICAL RECORD NO.:  [id number]                   PATIENT TYPE:  INP   LOCATION:  3021                                 FACILITY:  [facility]   PHYSICIAN:  Deanna Artis. Sharene Skeans, M.D.           DATE OF BIRTH:  Dec 31, 1945   DATE OF ADMISSION:  11/28/2002  DATE OF DISCHARGE:                                HISTORY & PHYSICAL   CHIEF COMPLAINT:  My legs are killing me.   HISTORY OF PRESENT ILLNESS:  A 65 year old right handed Caucasian married  woman with chronic low back pain status post lumbar laminectomies 1976,  1977, 1997, and January 2003. The patient has had developed arachnoiditis  and a gait disorder. Her last hospitalization in October 2003 was for 11  days. She had a failed epidural block, was treated with Procardia for reflex  sympathetic dystrophy that did not turn out to be present, Tegretol which  caused her to have altered mental status, IV morphine which failed to  benefit her. 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 She was treated with Celexa  and Remeron. Methadone was increased to 50 mg three times a day with good  effect. She was able to cooperative in physical therapy and was discharged  home. The patient was last seen in the office August 20, 2002. She has been a  patient of Dr. Shireen Quan who is no longer a member of [facility]. At that time, she had some embellished physical findings. Her  gait was basically normal. She had no lateralized findings. Over the past  several days, she has had increased burning dysesthesias and her pain in her  legs from the sciatic notch to her toes circumferentially. The patient  increased her methadone to 50 mg four times a day from 40 with no effect.  She is admitted for failure of her outpatient pain regimen for further  diagnostic evaluation and change in her therapy.   REVIEW OF SYSTEMS:  The patient has had migraine headaches, cervicogenic  pain with occipital neuralgia; insomnia. She has had no fever, weight loss,  change in appetite. She has complained of weakness in her legs and repeated  falling. No change in bowel or bladder control. Remainder of _________  system review is negative.   PAST MEDICAL HISTORY:  Remarkable for atypical chest pain, anxiety and panic  disorder, chronic renal insufficiency, and hypertension.   PAST SURGICAL HISTORY:  1. Laminectomies x4.  2. Hysterectomy with unilateral oophorectomy 1993; appendectomy at the same     time.  3. She had tonsillectomy as an adolescent.   CURRENT MEDICATIONS:  1. Soma 350 mg q.i.d.  2. Xanax 0.5 mg t.i.d.  3. Methadone 40 mg q.i.d.  4. Lisinopril 10 mg q.d.  5. Hydrochlorothiazide 25 mg every other day.  6. Tizanidine 4 mg b.i.d.   ALLERGIES:  PENICILLIN and SULFA caused anaphylaxis with cessation of  breathing. She has intolerance to DEMEROL (hallucinations), CODEINE and  TORADOL (nausea), VERSED (angry affect).   FAMILY HISTORY:  Positive for cerebral vascular accident, diabetes mellitus,  atherosclerotic cardiovascular disease, hypertension, dyslipidemia,  hypothyroidism, migraine, and kidney disorders.   SOCIAL HISTORY:  The patient went to the [place] of [place] for  two years. She has been disabled since 1991, married for 15 years. Her  husband is disabled. She has over 30 pack year history of smoking. Peak was  three packs per day. Now, she smokes one pack per day. She quit alcohol in  1990. She does not use drugs.   PHYSICAL EXAMINATION:  VITAL SIGNS:  Temperature 100.3, pulse 79,  respirations 20, blood pressure 129/82, pulse oximetry 94%.  EAR, NOSE, AND THROAT:  No infection. Decreased range of motion of her neck.  No bruits. No meningismus.  LUNGS:  Clear.  HEART:  No murmurs. Pulses normal.  ABDOMEN:  Soft, nontender, bowel sounds normal, no  hepatosplenomegaly.  EXTREMITIES:  Show positive straight leg raising at 5 degrees bilaterally.  She has pain her back with any manipulation of her legs. She lies on her  side with her legs flexed to decrease her pain. No edema or cyanosis.  NEUROLOGICAL:  Mental status:  The patient was intact, awake, tearful,  depressed, oriented. Normal memory, fund of knowledge, and language. Cranial  nerves:  Round and reactive pupils. Fundi normal. Visual fields full to  double simultaneous stimuli. Extraocular movements full and conjugate.  Symmetric facial strength. Midline tongue and uvula. Air conduction greater  than bone conduction bilaterally. Motor examination:  Normal strength except  for her hip flexors which has give away because of pain. No drift. Fine  motor movements are normal sensation. 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 She has intact stereognosis. Cerebellar examination:  Good finger to  nose __________ movement. Gait antalgic. She can get up on her toes, not on  her heels. Negative Romberg. Deep tendon reflexes were normal at the biceps,  triceps, and patella; decreased at the brachial radialis, absent at the  ankles, bilateral flexor tendon responses.   IMPRESSION:  Intractable low back pain.   PLAN:  1. Lumbosacral plain spine films and also lumbosacral CT spine to look for     changes in her surgery or newly ruptured disks.  2. Decrease methadone to 50 q.i.d.  3. Start Trileptal 150 b.i.d. for neuritic component to her pain.  4. Will check her for a urinary tract infection because of her low grade     fever. Other general laboratories will be performed.   I appreciate the opportunity to participate in her care. I hope to discharge  her home relatively soon. I am not starting her on IV narcotics because in  the past they have failed.                                               Deanna Artis. Sharene Skeans, M.D.    [facility]  D:  11/28/2002  T:  11/29/2002  Job:   [id number]  cc:   Molly Maduro L. Foy [place], M.D.  [address] [place]  [place] [address]  Fax: [phone]    cc:   Doris Cheadle. Foy [place], M.D.  [address] [place]  [place]  [address]  Fax: [phone]

## 2010-10-09 NOTE — Op Note (Signed)
[facility]  Patient:    [name] [name], [name] [name] Visit Number: [id number] MRN: [id number]          Service Type: MED Location: 3000 [id number] Attending Physician:  Erich Montane Dictated by:   Julio Sicks, M.D. Proc. 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  PROCEDURES: 1. Re-exploration of L4-5 laminotomy with complete L4-5 laminectomy and    foraminotomies. 2. Bilateral L4-5 microdiskectomies. 3. Posterior lumbar interbody fusion utilizing Tangent wedges and local    autograft. 4. Posterolateral fusion utilizing pedicle screw instrumentation and local    autograft.  SURGEON:  Julio Sicks, M.D.  ASSISTANT:  Donalee Citrin, Montez Hageman., M.D.  ANESTHESIA:  General endotracheal.  INDICATION:  Ms. [name] [name] is a 65 year old female with history of chronic back and right lower extremity pain.  These symptoms have worsened over the past month. MRI scanning demonstrates evidence of a recurrent disk herniation off to the right side at L4-5, where she has had at least three previous operations. There is evidence of fracturing of the right-sided L4-5 facet joint complex. There is severe disk space collapse and high signal within the disk space consistent with instability.  We discussed options available for management, including lumbar decompression and fusion surgery.  The patient wishes to proceed.  DESCRIPTION OF PROCEDURE:  The patient was taken to the operating room and placed on the operating table in a supine position.  After an adequate level of anesthesia was achieved, the patient was positioned prone onto a Wilson frame, appropriately padded.  The patients lumbar region was  prepped and draped sterilely.  A 10 blade was used to make a linear skin incision overlying the L3, L4, and L5 levels.  This was carried down sharply to the fascia.  A subperiosteal dissection was performed bilaterally, exposing the laminae and facet joints at L3, L4, and L5.  Dissection then proceeded out further laterally, exposing the transverse processes of L4 and L5.  Deep self-retaining retractor was placed.  Intraoperative fluoroscopy was used, and the level was confirmed.  A complete laminectomy at L4-5 was then performed using Leksell rongeurs, high-speed drill, and Kerrison rongeurs to remove the total lamina of L4 and the superior one-third of the lamina of L5.  The complete inferior facets at L4 were resected.  It should be noted that the inferior facet of L4 on the left side was already fractured.  The superior facets of L5 were removed bilaterally.  All bone was cleaned and used for later autografting.  Ligamentum flavum was then elevated and resected in piecemeal fashion using Kerrison rongeurs.  Epidural scar from the patients previous laminotomy was dissected free.  Underlying thecal sac and exiting L4 and L5 nerve roots were identified and widely decompressed.   Attention was then placed to the interspace.  Epidural venous plexus was coagulated and cut on both sides.  Turning first to the patients left side, the thecal sac and nerve roots were retracted and protected.  The disk space was incised with a 15 blade in rectangular fashion and a wide disk space clean-out was then achieved using pituitary rongeurs, upward-angled pituitary rongeurs, and Epstein curettes.  All  loose or obviously degenerative disk material was removed from the interspace.  Attention then placed to the contralateral side. Once again the thecal sac and nerve roots were protected.  Epidural scar was lysed, and the retraction was made closer to midline.  The disk herniation was readily apparent.  It was  incised with a 15 blade in a rectangular fashion.  A wide disk space clean-out was then achieved using pituitary rongeurs, upward-angled pituitary rongeurs, and Epstein curettes.  The disk space was then sequentially dilated up to 10 mm.  Attention was then placed back up on the left side.  The thecal sac and nerve roots were protected.  The disk space was then reamed, then cut with a 10 mm Tangent chisel.  A 10 x 26 mm Tangent wedge was then impacted in placed, recessed approximately 2 mm from the posterior cortical margin.  Retractor system was removed.  There was no evidence of any injury to thecal sac or nerve roots.  Attention was then placed on the contralateral side.  Once again with the nerve roots protected, the disk space was then reamed and then cut with a 10 mm chisel.  After the disk space was cleaned, the disk space was further curetted and morcellized autograft was packed into the interspace.  The second Tangent wedge was then impacted into place and recessed approximately 1 mm from the posterior cortical margin.  Retractor system was then removed.  There was no evidence of any injury to the thecal sac or nerve roots.  Attention was then placed to placement of pedicle screw instrumentation.  Pedicles of L4 and L5 were then isolated using surface landmarks and fluoroscopy.  Superficial bone was removed over the back of the pedicle using the high-speed drill.  The pedicles were then probed using the pedicle awl.  Each pedicle awl track was found to be solidly in bone using a blunt probe.  Each pedicle awl track was then tapped with a 5.25 mm screw tap.  Once again each screw tap hole was found to be solidly within bone.  Spiral 90 6.745 x 45 pedicle screws were then placed on the right side at L4 and bilaterally at L5.  Due to the pedicle anatomy on L4 on the left side, a 5.75 x 45 mm screw was placed.  All screws were found to be well-positioned and solidly within bone.  The  transverse processes at L4 and L5 were then decorticated using the high-speed drill.  Morcellized autograft was packed posterolaterally for later fusion.  Short-segment  titanium rods were then placed over the screw heads at L4 and L5.  Locking caps were attached.  They were then compressed and final tightening was achieved on each cap.  Final images revealed good position of the bone grafts and hardware, proper operative level, with normal alignment of the spine. Blunt probes passed easily along the course of the exiting nerve roots.  There was no evidence of any injury to the thecal sac or nerve roots.  The wound was then copiously irrigated out with antibiotic solution.  Gelfoam was placed topically for hemostasis, which was found to be good.  A Hemovac drain was left in the epidural space.  The wound was then closed in layers with Vicryl sutures.  Steri-Strips and a sterile dressing were applied.  There were no apparent complications.  The patient tolerated the procedure well, and she returns to the recovery room postop. Dictated by:   Julio Sicks, M.D. Attending Physician:  Sandria Manly,  Alison Murray DD:  06/09/01 TD:  06/12/01 Job: [id number]

## 2011-02-17 LAB — BASIC METABOLIC PANEL
Chloride: 107
Creatinine, Ser: 0.97
GFR calc Af Amer: >60
Potassium: 4

## 2011-02-17 LAB — POCT CARDIAC MARKERS
Myoglobin, poc: 62.8
Operator id: 280141
Troponin i, poc: <0.05

## 2011-02-17 LAB — LIPID PANEL
Cholesterol: 201 — ABNORMAL HIGH
Total CHOL/HDL Ratio: 4.9

## 2011-02-17 LAB — HOMOCYSTEINE: Homocysteine: 11.8

## 2011-02-18 LAB — POCT I-STAT, CHEM 8
Chloride: 107
HCT: 40
Hemoglobin: 13.6
Potassium: 3.9
Sodium: 138

## 2011-02-18 LAB — RAPID URINE DRUG SCREEN, HOSP PERFORMED
Amphetamines: NOT DETECTED
Barbiturates: NOT DETECTED
Tetrahydrocannabinol: NOT DETECTED

## 2011-02-18 LAB — URINALYSIS, ROUTINE W REFLEX MICROSCOPIC
Ketones, ur: NEGATIVE
Nitrite: NEGATIVE
Urobilinogen, UA: 0.2
pH: 5.5

## 2011-02-18 LAB — ETHANOL: Alcohol, Ethyl (B): <5

## 2011-03-31 ENCOUNTER — Other Ambulatory Visit: Payer: Self-pay | Admitting: Family Medicine

## 2011-03-31 DIAGNOSIS — G40409 Other generalized epilepsy and epileptic syndromes, not intractable, without status epilepticus: Secondary | ICD-10-CM

## 2011-04-07 ENCOUNTER — Other Ambulatory Visit: Payer: Self-pay

## 2011-05-03 ENCOUNTER — Ambulatory Visit
Admission: RE | Admit: 2011-05-03 | Discharge: 2011-05-03 | Disposition: A | Discharge status: Home / Self Care | Payer: No Typology Code available for payment source | Source: Ambulatory Visit | Attending: Family Medicine | Admitting: Family Medicine

## 2011-05-03 DIAGNOSIS — G40409 Other generalized epilepsy and epileptic syndromes, not intractable, without status epilepticus: Secondary | ICD-10-CM

## 2011-05-03 MED ORDER — GADOBENATE DIMEGLUMINE 529 MG/ML IV SOLN
6.0000 mL | Freq: Once | INTRAVENOUS | Status: AC | PRN
  Administered 2011-05-03: 6 mL via INTRAVENOUS

## 2011-05-25 DIAGNOSIS — F445 Conversion disorder with seizures or convulsions: Secondary | ICD-10-CM

## 2011-05-25 HISTORY — DX: Conversion disorder with seizures or convulsions: F44.5

## 2011-06-30 ENCOUNTER — Other Ambulatory Visit: Payer: Self-pay | Admitting: Family Medicine

## 2011-06-30 DIAGNOSIS — Z1231 Encounter for screening mammogram for malignant neoplasm of breast: Secondary | ICD-10-CM

## 2011-09-02 ENCOUNTER — Ambulatory Visit: Payer: No Typology Code available for payment source

## 2011-11-04 ENCOUNTER — Ambulatory Visit: Payer: No Typology Code available for payment source

## 2011-11-18 ENCOUNTER — Encounter (HOSPITAL_COMMUNITY): Payer: Self-pay | Admitting: *Deleted

## 2011-11-18 ENCOUNTER — Emergency Department (HOSPITAL_COMMUNITY)
Admission: EM | Admit: 2011-11-18 | Discharge: 2011-11-18 | Disposition: A | Discharge status: Home / Self Care | Payer: Medicare HMO | Attending: Emergency Medicine | Admitting: Emergency Medicine

## 2011-11-18 ENCOUNTER — Emergency Department (HOSPITAL_COMMUNITY): Payer: Medicare HMO

## 2011-11-18 DIAGNOSIS — R110 Nausea: Secondary | ICD-10-CM | POA: Insufficient documentation

## 2011-11-18 DIAGNOSIS — R079 Chest pain, unspecified: Secondary | ICD-10-CM

## 2011-11-18 DIAGNOSIS — R0602 Shortness of breath: Secondary | ICD-10-CM | POA: Insufficient documentation

## 2011-11-18 LAB — BASIC METABOLIC PANEL
Calcium: 10.2 mg/dL (ref 8.4–10.5)
Chloride: 104 mEq/L (ref 96–112)
Creatinine, Ser: 1.66 mg/dL — ABNORMAL HIGH (ref 0.50–1.10)
GFR calc Af Amer: 36 mL/min — ABNORMAL LOW (ref >90)
Sodium: 140 mEq/L (ref 135–145)

## 2011-11-18 LAB — CBC
HCT: 38.8 % (ref 36.0–46.0)
Hemoglobin: 13.1 g/dL (ref 12.0–15.0)
MCHC: 33.8 g/dL (ref 30.0–36.0)
RBC: 4.27 MIL/uL (ref 3.87–5.11)

## 2011-11-18 LAB — POCT I-STAT TROPONIN I: Troponin i, poc: 0.01 ng/mL (ref 0.00–0.08)

## 2011-11-18 LAB — LIPASE, BLOOD: Lipase: 16 U/L (ref 11–59)

## 2011-11-18 LAB — COMPREHENSIVE METABOLIC PANEL
BUN: 43 mg/dL — ABNORMAL HIGH (ref 6–23)
Calcium: 10.2 mg/dL (ref 8.4–10.5)
Creatinine, Ser: 1.69 mg/dL — ABNORMAL HIGH (ref 0.50–1.10)
GFR calc Af Amer: 36 mL/min — ABNORMAL LOW (ref >90)
Glucose, Bld: 91 mg/dL (ref 70–99)
Total Protein: 6.9 g/dL (ref 6.0–8.3)

## 2011-11-18 MED ORDER — SODIUM CHLORIDE 0.9 % IV BOLUS (SEPSIS)
500.0000 mL | Freq: Once | INTRAVENOUS | Status: AC
  Administered 2011-11-18: 20:00:00 via INTRAVENOUS

## 2011-11-18 MED ORDER — ASPIRIN 325 MG PO TABS: 325.0000 mg | ORAL_TABLET | ORAL | Status: DC

## 2011-11-18 MED ORDER — MORPHINE SULFATE 4 MG/ML IJ SOLN
4.0000 mg | Freq: Once | INTRAMUSCULAR | Status: AC
  Administered 2011-11-18: 4 mg via INTRAVENOUS
  Filled 2011-11-18: qty 1

## 2011-11-18 MED ORDER — SODIUM CHLORIDE 0.9 % IV SOLN
Freq: Once | INTRAVENOUS | Status: AC
  Administered 2011-11-18: 1000 mL via INTRAVENOUS

## 2011-11-18 MED ORDER — NITROGLYCERIN 0.4 MG SL SUBL
0.4000 mg | SUBLINGUAL_TABLET | SUBLINGUAL | Status: DC | PRN
  Administered 2011-11-18: 0.4 mg via SUBLINGUAL
  Filled 2011-11-18: qty 25

## 2011-11-18 MED ORDER — SODIUM CHLORIDE 0.9 % IV BOLUS (SEPSIS): 1000.0000 mL | Freq: Once | INTRAVENOUS | Status: DC

## 2011-11-18 MED ORDER — ONDANSETRON HCL 4 MG/2ML IJ SOLN
4.0000 mg | Freq: Once | INTRAMUSCULAR | Status: AC
  Administered 2011-11-18: 4 mg via INTRAVENOUS
  Filled 2011-11-18: qty 2

## 2011-11-18 NOTE — ED Notes (Signed)
PT reports Sudden on set of sharp CP . Denies N/V. EMS gave 324 ASA and one NGT tab. No no relief of CP.

## 2011-11-18 NOTE — ED Notes (Signed)
DR. Berlinda Last NOTIFIED THAT PT. WANTS TO LEAVE AMA , PT. SIGN AMA FORM - STATES WILL CALL A CAB TO TAKE HER HOME.

## 2011-11-18 NOTE — ED Provider Notes (Signed)
History     CSN: [id number]  Arrival date & time 11/18/11  4098   First MD Initiated Contact with Patient 11/18/11 1850      Chief Complaint  Patient presents with  . Chest Pain    (Consider location/radiation/quality/duration/timing/severity/associated sxs/prior treatment) HPI Pt woke this morning with chest pressure at 0500. No radiation. She then had sharp "like being stabbed in the chest" pain at noon after she ate associated with nausea and mild SOB. Was ASA and NTG by EMS without improvement. No recent travel or surgery. No cough. Pain worse with deep inspiration. No past medical history on file.  No past surgical history on file.  No family history on file.  History  Substance Use Topics  . Smoking status: Not on file  . Smokeless tobacco: Not on file  . Alcohol Use: Not on file    OB History    Grav Para Term Preterm Abortions TAB SAB Ect Mult Living                  Review of Systems  Constitutional: Negative for fever and chills.  Respiratory: Positive for shortness of breath. Negative for cough.   Cardiovascular: Positive for chest pain. Negative for palpitations and leg swelling.  Gastrointestinal: Positive for nausea. Negative for vomiting and abdominal pain.  Musculoskeletal: Negative for back pain.  Skin: Negative for rash and wound.  Neurological: Negative for weakness, numbness and headaches.    Allergies  Penicillins; Sulfa antibiotics; and Sertraline hcl  Home Medications   Current Outpatient Rx  Name Route Sig Dispense Refill  . ALPRAZOLAM 1 MG PO TABS Oral Take 1 mg by mouth daily as needed. For anxiety    . LISINOPRIL PO Oral Take 1 tablet by mouth daily.    Marland Kitchen METHADONE HCL 10 MG PO TABS Oral Take 20 mg by mouth every 8 (eight) hours. Patient states she takes Oty:5 of the 20mg  tablets a day      BP 111/66  Pulse 56  Temp 97.7 F (36.5 C) (Oral)  Resp 16  SpO2 100%  Physical Exam  Nursing note and vitals reviewed. Constitutional:  She is oriented to person, place, and time. She appears well-developed and well-nourished. No distress.       Anxious, tearful  HENT:  Head: Normocephalic and atraumatic.  Mouth/Throat: Oropharynx is clear and moist.  Eyes: EOM are normal. Pupils are equal, round, and reactive to light.  Neck: Normal range of motion. Neck supple.  Cardiovascular: Normal rate and regular rhythm.   Pulmonary/Chest: Effort normal and breath sounds normal. No respiratory distress. She has no wheezes. She has no rales. She exhibits tenderness (Pt pain is reproduced with palpation over sternum. No crepitance).  Abdominal: Soft. Bowel sounds are normal. There is no tenderness. There is no rebound and no guarding.  Musculoskeletal: Normal range of motion. She exhibits no edema and no tenderness.  Neurological: She is alert and oriented to person, place, and time.  Skin: Skin is warm and dry. No rash noted. No erythema.  Psychiatric:       Anxious     ED Course  Procedures (including critical care time)  Labs Reviewed  BASIC METABOLIC PANEL - Abnormal; Notable for the following:    Potassium 3.4 (*)     BUN 43 (*)     Creatinine, Ser 1.66 (*)     GFR calc non Af Amer 31 (*)     GFR calc Af Amer 36 (*)  All other components within normal limits  D-DIMER, QUANTITATIVE - Abnormal; Notable for the following:    D-Dimer, Quant 0.97 (*)     All other components within normal limits  COMPREHENSIVE METABOLIC PANEL - Abnormal; Notable for the following:    Potassium 3.3 (*)     BUN 43 (*)     Creatinine, Ser 1.69 (*)     AST 47 (*)     ALT 41 (*)     Alkaline Phosphatase 132 (*)     Total Bilirubin 0.2 (*)     GFR calc non Af Amer 31 (*)     GFR calc Af Amer 36 (*)     All other components within normal limits  CBC  POCT I-STAT TROPONIN I  LIPASE, BLOOD  LAB REPORT - SCANNED   Dg Chest 2 View  11/18/2011  *RADIOLOGY REPORT*  Clinical Data: Chest pain  CHEST - 2 VIEW  Comparison: Chest radiograph T  27,013  Findings: Heart silhouette is enlarged.  There is a obscuration of the left heart border.  No pleural fluid evident on the lateral projection.  No pulmonary edema.  No pneumothorax.  There is a oval density in the right upper lobe measuring 10 mm which appears to be calcific; however, cannot be placed on prior radiographs.  IMPRESSION:  1. Obscuration of left heart suggests  lingular pneumonia or atelectasis. 2.  Mild cardiomegaly. 3.  Indeterminate ovoid lesion in the right upper lobe appears benign but cannot be placed on prior.  Recommend follow-up radiographs or CT scan.  Original Report Authenticated By: Genevive Bi, M.D.     No diagnosis found.   Date: 11/18/2011  Rate: 75  Rhythm: normal sinus rhythm  QRS Axis: normal  Intervals: normal  ST/T Wave abnormalities: normal  Conduction Disutrbances:none  Narrative Interpretation:   Old EKG Reviewed: unchanged    MDM   Pt states pain is improved. Resting quietly.        Loren Racer, MD 11/20/11 [phone]

## 2011-11-20 ENCOUNTER — Emergency Department (HOSPITAL_COMMUNITY): Payer: Medicare HMO

## 2011-11-20 ENCOUNTER — Encounter (HOSPITAL_COMMUNITY): Payer: Self-pay

## 2011-11-20 ENCOUNTER — Emergency Department (HOSPITAL_COMMUNITY)
Admission: EM | Admit: 2011-11-20 | Discharge: 2011-11-20 | Discharge status: Against Medical Advice | Payer: Medicare HMO | Attending: Emergency Medicine | Admitting: Emergency Medicine

## 2011-11-20 DIAGNOSIS — R079 Chest pain, unspecified: Secondary | ICD-10-CM

## 2011-11-20 DIAGNOSIS — F411 Generalized anxiety disorder: Secondary | ICD-10-CM | POA: Insufficient documentation

## 2011-11-20 DIAGNOSIS — I10 Essential (primary) hypertension: Secondary | ICD-10-CM | POA: Insufficient documentation

## 2011-11-20 DIAGNOSIS — F419 Anxiety disorder, unspecified: Secondary | ICD-10-CM

## 2011-11-20 HISTORY — DX: Meningitis, unspecified: G03.9

## 2011-11-20 HISTORY — DX: Essential (primary) hypertension: I10

## 2011-11-20 LAB — CBC
HCT: 35.6 % — ABNORMAL LOW (ref 36.0–46.0)
Hemoglobin: 12 g/dL (ref 12.0–15.0)
MCH: 30.5 pg (ref 26.0–34.0)
MCV: 90.4 fL (ref 78.0–100.0)
Platelets: 209 10*3/uL (ref 150–400)
RBC: 3.94 MIL/uL (ref 3.87–5.11)
WBC: 5.7 10*3/uL (ref 4.0–10.5)

## 2011-11-20 LAB — BASIC METABOLIC PANEL
GFR calc Af Amer: 61 mL/min — ABNORMAL LOW (ref >90)
GFR calc non Af Amer: 53 mL/min — ABNORMAL LOW (ref >90)
Potassium: 3.7 mEq/L (ref 3.5–5.1)
Sodium: 143 mEq/L (ref 135–145)

## 2011-11-20 LAB — POCT I-STAT TROPONIN I

## 2011-11-20 MED ORDER — SODIUM CHLORIDE 0.9 % IV SOLN
Freq: Once | INTRAVENOUS | Status: AC
  Administered 2011-11-20: 16:00:00 via INTRAVENOUS

## 2011-11-20 MED ORDER — HYDROMORPHONE HCL PF 1 MG/ML IJ SOLN
1.0000 mg | Freq: Once | INTRAMUSCULAR | Status: AC
  Administered 2011-11-20: 1 mg via INTRAVENOUS
  Filled 2011-11-20: qty 1

## 2011-11-20 MED ORDER — ONDANSETRON HCL 4 MG/2ML IJ SOLN
4.0000 mg | Freq: Once | INTRAMUSCULAR | Status: AC
  Administered 2011-11-20: 4 mg via INTRAVENOUS
  Filled 2011-11-20: qty 2

## 2011-11-20 MED ORDER — TECHNETIUM TO 99M ALBUMIN AGGREGATED
3.0000 | Freq: Once | INTRAVENOUS | Status: AC | PRN
  Administered 2011-11-20: 3 via INTRAVENOUS

## 2011-11-20 MED ORDER — XENON XE 133 GAS
18.0000 | GAS_FOR_INHALATION | Freq: Once | RESPIRATORY_TRACT | Status: AC | PRN
  Administered 2011-11-20: 18 via RESPIRATORY_TRACT

## 2011-11-20 NOTE — ED Notes (Signed)
Synetta Fail RT called informed this RN nuclear medicine has left for the day, EDP Linker will have to call radiologist to get order for radiologist to call in [place]
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
## 2011-11-20 NOTE — ED Notes (Signed)
Received bedside report from [place], [place].  Patient just returned from x-ray; no respiratory or acute distress noted.  Patient updated on plan of care; informed patient that we are currently waiting on results from x-ray to come back.  Patient has no other questions or concerns at this time.  Husband at bedside; patient became tearful when seeing husband for first time since she has been in ED.  Will continue to monitor.

## 2011-11-20 NOTE — ED Notes (Signed)
Patient transported to X-ray 

## 2011-11-20 NOTE — ED Notes (Signed)
Patient given copy of discharge paperwork; went over discharge instructions with patient.  Patient instructed to follow up with primary care physician/referral within two days, and to return to the ED for new, worsening, or concerning symptoms.

## 2011-11-20 NOTE — ED Notes (Signed)
States her Husband's name is Zollie Beckers and his number is [phone]

## 2011-11-20 NOTE — ED Notes (Signed)
Was seen here last Thursday for Chest pain

## 2011-11-20 NOTE — ED Notes (Signed)
Pt's spouse Zollie Beckers is coming to visit pt in ED

## 2011-11-20 NOTE — ED Notes (Signed)
Patient requesting pain medication; EDP notified -- EDP states that patient just received Dilaudid not long ago.
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
## 2011-11-20 NOTE — Discharge Instructions (Signed)
Return to the ED with any concerns including difficulty breathing, fainting, thoughts or feelings of suicide, or any other alarming symptoms °

## 2011-11-20 NOTE — ED Provider Notes (Signed)
History     CSN: [id number]  Arrival date & time 11/20/11  1432   First MD Initiated Contact with Patient 11/20/11 1525      Chief Complaint  Patient presents with  . Chest Pain    (Consider location/radiation/quality/duration/timing/severity/associated sxs/prior treatment) HPI Pt presents with c/o chest pain.  She states her pain is c/w her prior anxiety attacks.  Pain came on during an argument with her husband.  She states that he left her today.  She describes sob with significant anxiety. No fainting, no leg swelling, No fever or cough.  There are no other associated systemic symptoms, there are no alleviating or modifying factors.   Past Medical History  Diagnosis Date  . Arachnoiditis   . Hypertension     Past Surgical History  Procedure Date  . Back surgery   . Abdominal hysterectomy   . Appendectomy     History reviewed. No pertinent family history.  History  Substance Use Topics  . Smoking status: Never Smoker   . Smokeless tobacco: Not on file  . Alcohol Use: No    OB History    Grav Para Term Preterm Abortions TAB SAB Ect Mult Living                  Review of Systems ROS reviewed and all otherwise negative except for mentioned in HPI  Allergies  Iohexol; Penicillins; Sulfa antibiotics; Sertraline hcl; Toradol; and Codeine  Home Medications   Current Outpatient Rx  Name Route Sig Dispense Refill  . ALPRAZOLAM 1 MG PO TABS Oral Take 1 mg by mouth daily as needed. For anxiety    . LISINOPRIL-HYDROCHLOROTHIAZIDE 10-12.5 MG PO TABS Oral Take 2 tablets by mouth daily.    Marland Kitchen METHADONE HCL 10 MG PO TABS Oral Take 50 mg by mouth 4 (four) times daily - after meals and at bedtime.      BP 136/73  Pulse 66  Temp 97.8 F (36.6 C) (Oral)  Resp 13  SpO2 97% Vitals reviewed Physical Exam Physical Examination: General appearance - alert, well appearing, and in no distress Mental status - alert, oriented to person, place, and time Eyes - pupils equal  and reactive, extraocular eye movements intact Mouth - mucous membranes moist, pharynx normal without lesions Chest - clear to auscultation, no wheezes, rales or rhonchi, symmetric air entry, chest wall tender to palpation diffusely Heart - normal rate, regular rhythm, normal S1, S2, no murmurs, rubs, clicks or gallops Abdomen - soft, nontender, nondistended, no masses or organomegaly Neurological - alert, oriented, normal speech, no focal findings or movement disorder noted Musculoskeletal - no joint tenderness, deformity or swelling Extremities - peripheral pulses normal, no pedal edema, no clubbing or cyanosis Skin - normal coloration and turgor, no rashes  ED Course  Procedures (including critical care time)   Date: 11/20/2011  Rate: 56  Rhythm: sinus bradycardia  QRS Axis: normal  Intervals: normal  ST/T Wave abnormalities: normal  Conduction Disutrbances:none  Narrative Interpretation:   Old EKG Reviewed: unchanged compared to prior ekg of 11/18/11  6:00 PM pt went for CT angio- she told the tech that she has had sob and headache with IV contrast in the past.  Per Dr. Tyron Russell, radiology patient not to receive contrast.  I have ordered VQ scan for this patient instead.     Labs Reviewed  CBC - Abnormal; Notable for the following:    HCT 35.6 (*)     All other components within normal limits  BASIC METABOLIC PANEL - Abnormal; Notable for the following:    GFR calc non Af Amer 53 (*)     GFR calc Af Amer 61 (*)     All other components within normal limits  POCT I-STAT TROPONIN I  LAB REPORT - SCANNED   Dg Chest 2 View  11/20/2011  *RADIOLOGY REPORT*  Clinical Data: Chest pain, shortness of breath, history hypertension  CHEST - 2 VIEW  Comparison: 11/18/2011  Findings: Enlargement of cardiac silhouette. Mediastinal contours and pulmonary vascularity normal. Emphysematous changes with minimal streaky atelectasis at medial right lower lobe. Minimal peribronchial thickening.  Remaining lungs clear. No pleural effusion or pneumothorax. Improved lingular aeration since previous exam.  IMPRESSION: Subsegmental atelectasis right lower lobe. Bronchitic and emphysematous changes question COPD.  Original Report Authenticated By: Lollie Marrow, M.D.   Nm Pulmonary Per & Vent  11/20/2011  *RADIOLOGY REPORT*  Clinical Data: Chest pain  NM PULMONARY VENTILATION AND PERFUSION SCAN  Radiopharmaceutical: CURIE MAA TECHNETIUM TO 46M ALBUMIN AGGREGATED, CURIE xenon xe 133 gas 18 milli Curie XENON XE 133 GAS  Comparison: None Correlation:  Chest radiograph 11/20/2011  Findings: Ventilation exam in anterior and posterior projections demonstrates a large ventilatory defect in the left lower lobe.  An additional small subsegmental ventilatory defect is seen laterally in the right mid lung. Mild delay in xenon washout from the right lower lobe.  Perfusion lung scan in eight projections demonstrates a matching area of decreased perfusion involving the left lower lobe.  Minimal irregularity of perfusion in the periphery of the right lung.  No other segmental or subsegmental perfusion defects identified.  Chest radiograph demonstrates changes of COPD with streaky atelectasis in the right lower lobe.  No definite left lower lobe radiographic abnormalities identified.  IMPRESSION: Matching areas of decreased ventilation and perfusion in the left lower lobe. Findings represent a low probability for pulmonary embolism.  Original Report Authenticated By: Lollie Marrow, M.D.     1. Chest pain   2. Anxiety       MDM  Pt presents with c/o chest pain after having her husband leave her today.  She had been seen 2 days ago in ED for chest pain- had left AMA prior to getting CT angio done- states "I had a panic attack so I left and sat in my car".  Pt has had VQ scan today which is low probability for PE.  Pt deneis SI/HI, pt discharged with stirct return precautions.          Ethelda Chick, MD 11/22/11 [phone]

## 2012-02-16 ENCOUNTER — Emergency Department (HOSPITAL_COMMUNITY)
Admission: EM | Admit: 2012-02-16 | Discharge: 2012-02-16 | Disposition: A | Discharge status: Home / Self Care | Payer: Medicare Other | Attending: Emergency Medicine | Admitting: Emergency Medicine

## 2012-02-16 ENCOUNTER — Emergency Department (HOSPITAL_COMMUNITY): Payer: Medicare Other

## 2012-02-16 ENCOUNTER — Encounter (HOSPITAL_COMMUNITY): Payer: Self-pay | Admitting: Family Medicine

## 2012-02-16 DIAGNOSIS — I10 Essential (primary) hypertension: Secondary | ICD-10-CM | POA: Insufficient documentation

## 2012-02-16 DIAGNOSIS — S2220XA Unspecified fracture of sternum, initial encounter for closed fracture: Secondary | ICD-10-CM | POA: Insufficient documentation

## 2012-02-16 DIAGNOSIS — W19XXXA Unspecified fall, initial encounter: Secondary | ICD-10-CM | POA: Insufficient documentation

## 2012-02-16 LAB — COMPREHENSIVE METABOLIC PANEL
ALT: 25 U/L (ref 0–35)
AST: 34 U/L (ref 0–37)
Albumin: 3.2 g/dL — ABNORMAL LOW (ref 3.5–5.2)
CO2: 24 mEq/L (ref 19–32)
Calcium: 10.6 mg/dL — ABNORMAL HIGH (ref 8.4–10.5)
GFR calc non Af Amer: 48 mL/min — ABNORMAL LOW (ref >90)
Sodium: 140 mEq/L (ref 135–145)
Total Protein: 6.9 g/dL (ref 6.0–8.3)

## 2012-02-16 LAB — CBC WITH DIFFERENTIAL/PLATELET
Basophils Absolute: 0 10*3/uL (ref 0.0–0.1)
Basophils Relative: 0 % (ref 0–1)
Eosinophils Relative: 5 % (ref 0–5)
Lymphocytes Relative: 40 % (ref 12–46)
MCV: 94 fL (ref 78.0–100.0)
Neutro Abs: 2.9 10*3/uL (ref 1.7–7.7)
Platelets: 192 10*3/uL (ref 150–400)
RDW: 14.5 % (ref 11.5–15.5)
WBC: 6.8 10*3/uL (ref 4.0–10.5)

## 2012-02-16 MED ORDER — OXYCODONE HCL 5 MG PO TABS
10.0000 mg | ORAL_TABLET | Freq: Once | ORAL | Status: AC
  Administered 2012-02-16: 10 mg via ORAL
  Filled 2012-02-16: qty 2

## 2012-02-16 MED ORDER — OXYCODONE HCL 5 MG PO TABS: 5.0000 mg | ORAL_TABLET | Freq: Four times a day (QID) | ORAL | Status: DC | PRN

## 2012-02-16 MED ORDER — IOHEXOL 350 MG/ML SOLN
100.0000 mL | Freq: Once | INTRAVENOUS | Status: AC | PRN
  Administered 2012-02-16: 100 mL via INTRAVENOUS

## 2012-02-16 MED ORDER — OXYCODONE-ACETAMINOPHEN 5-325 MG PO TABS: 2.0000 | ORAL_TABLET | Freq: Once | ORAL | Status: DC

## 2012-02-16 NOTE — ED Notes (Signed)
Patient transported to CT 

## 2012-02-16 NOTE — ED Notes (Signed)
MD at bedside. 

## 2012-02-16 NOTE — ED Notes (Signed)
Pt st's pain in chest is sharpe and increases with movement and palpation.  Also st's she fell several days ago and has had pain in her chest since fall.

## 2012-02-16 NOTE — ED Notes (Signed)
IV attempted x's 2 without success 

## 2012-02-16 NOTE — ED Provider Notes (Addendum)
History     CSN: [id number]  Arrival date & time 02/16/12  1352   First MD Initiated Contact with Patient 02/16/12 1539      Chief Complaint  Patient presents with  . Chest Pain    (Consider location/radiation/quality/duration/timing/severity/associated sxs/prior treatment) Patient is a 66 y.o. female presenting with chest pain. The history is provided by the patient.  Chest Pain Episode onset: 3 weeks ago after a fall where she fell backwards. Chest pain occurs constantly. The chest pain is unchanged. The pain is associated with breathing and coughing. At its most intense, the pain is at 9/10. The pain is currently at 9/10. The severity of the pain is severe. The quality of the pain is described as pleuritic, sharp and stabbing. The pain does not radiate. Chest pain is worsened by certain positions and deep breathing. Primary symptoms include shortness of breath and nausea. Pertinent negatives for primary symptoms include no cough, no wheezing and no vomiting. Primary symptoms comment: Feel short of breath because it hurts badly to take a deep breath  Pertinent negatives for associated symptoms include no lower extremity edema. She tried nothing for the symptoms.  Her past medical history is significant for hypertension.  Pertinent negatives for past medical history include no CAD, no diabetes and no hyperlipidemia.     Past Medical History  Diagnosis Date  . Arachnoiditis   . Hypertension     Past Surgical History  Procedure Date  . Back surgery   . Abdominal hysterectomy   . Appendectomy     History reviewed. No pertinent family history.  History  Substance Use Topics  . Smoking status: Never Smoker   . Smokeless tobacco: Not on file  . Alcohol Use: No    OB History    Grav Para Term Preterm Abortions TAB SAB Ect Mult Living                  Review of Systems  Respiratory: Positive for shortness of breath. Negative for cough and wheezing.   Cardiovascular:  Positive for chest pain.  Gastrointestinal: Positive for nausea. Negative for vomiting.  All other systems reviewed and are negative.    Allergies  Iohexol; Penicillins; Sulfa antibiotics; Sertraline hcl; Toradol; and Codeine  Home Medications   Current Outpatient Rx  Name Route Sig Dispense Refill  . ALPRAZOLAM 1 MG PO TABS Oral Take 1 mg by mouth daily as needed. For anxiety    . LISINOPRIL-HYDROCHLOROTHIAZIDE 10-12.5 MG PO TABS Oral Take 2 tablets by mouth daily.    Marland Kitchen METHADONE HCL 10 MG PO TABS Oral Take 50 mg by mouth 4 (four) times daily - after meals and at bedtime.      BP 144/92  Pulse 75  Temp 97.3 F (36.3 C) (Oral)  Resp 18  SpO2 100%  Physical Exam  Nursing note and vitals reviewed. Constitutional: She is oriented to person, place, and time. She appears well-developed and well-nourished. No distress.  HENT:  Head: Normocephalic and atraumatic.  Mouth/Throat: Oropharynx is clear and moist.  Eyes: Conjunctivae normal and EOM are normal. Pupils are equal, round, and reactive to light.  Neck: Normal range of motion. Neck supple.  Cardiovascular: Normal rate, regular rhythm and intact distal pulses.   No murmur heard. Pulmonary/Chest: Effort normal and breath sounds normal. No respiratory distress. She has no wheezes. She has no rales. She exhibits tenderness. She exhibits no deformity.    Abdominal: Soft. She exhibits no distension. There is no tenderness. There  is no rebound and no guarding.  Musculoskeletal: Normal range of motion. She exhibits no edema and no tenderness.  Neurological: She is alert and oriented to person, place, and time.  Skin: Skin is warm and dry. No rash noted. No erythema.  Psychiatric: She has a normal mood and affect. Her behavior is normal.    ED Course  Procedures (including critical care time)   Labs Reviewed  CBC WITH DIFFERENTIAL  COMPREHENSIVE METABOLIC PANEL   Dg Chest 2 View  02/16/2012  *RADIOLOGY REPORT*  Clinical  Data: Pain  CHEST - 2 VIEW  Comparison: 11/20/2011  Findings: Cardiomediastinal silhouette is stable.  Mild hyperinflation again noted.  Stable streaky atelectasis right base medially.  No acute infiltrate or pulmonary edema.  Stable central mild bronchitic changes.  Osteopenia and mild degenerative changes thoracic spine.  IMPRESSION: No active disease.  Stable streaky atelectasis right base medially. Mild hyperinflation again noted.   Original Report Authenticated By: Natasha Mead, M.D.    Ct Angio Chest Pe W/cm &/or Wo Cm  02/16/2012  *RADIOLOGY REPORT*  Clinical Data: Chest pain, recent fall, evaluate for PE  CT ANGIOGRAPHY CHEST  Technique:  Multidetector CT imaging of the chest using the standard protocol during bolus administration of intravenous contrast. Multiplanar reconstructed images including MIPs were obtained and reviewed to evaluate the vascular anatomy.  Contrast: OMNIPAQUE IOHEXOL 350 MG/ML SOLN  Comparison: Chest radiographs dated 02/16/2012  Findings: No evidence of pulmonary embolism.  Mild mosaic attenuation.  Linear scarring versus atelectasis in the left lower lobe. No pleural effusion or pneumothorax.  The visualized thyroid is unremarkable.  The heart is normal in size.  No pericardial effusion.  Coronary atherosclerosis.  Atherosclerotic calcifications of the aortic arch.  No suspicious mediastinal, hilar, or axillary lymphadenopathy.  Visualized upper abdomen is notable for mild intrahepatic ductal dilatation.  Degenerative changes of the visualized thoracolumbar spine.  Tiny nondisplaced fracture involving the anterior cortex of the sternum (sagittal image 62).  IMPRESSION: No evidence of pulmonary embolism.  Tiny nondisplaced sternal fracture.  Mild intrahepatic ductal dilatation, correlate with LFTs.   Original Report Authenticated By: Charline Bills, M.D.     Date: 02/16/2012  Rate: 62  Rhythm: normal sinus rhythm  QRS Axis: normal  Intervals: normal  ST/T Wave  abnormalities: normal  Conduction Disutrbances: none  Narrative Interpretation: unremarkable      1. Sternal fracture       MDM   Patient with a history of chest pain for the last 3 weeks. The pain is pleuritic and across her entire chest. She states it started after a fall 3 weeks ago. She also complains that it causes her to feel short of breath. She states during this fall 3 weeks ago she did hit her head but did not have LOC and continues to have tenderness where she hit. However she's not on anticoagulation and given the period of time and low suspicion for acute intercranial pathology. Also patient is a smoker and states that when she was coughing yesterday she used her husband's inhaler which significantly improved her pain. Chest pain etiology could be do to chest wall injury from her fall versus pleurisy/costochondritis due to being a smoker versus PE. Patient does have a strong family history of PE but she herself has never had PE.  Patient has normal vital signs on exam and is satting 100% on room air. She has diffuse chest wall tenderness but no audible wheezing. Chest x-ray without acute pathology. CBC, CMP, d-dimer  pending.  9:36 PM No signs of PE but patient does have evidence of a sternal fracture which is most likely the cause of her pain that resulted after her fall 3 weeks ago. Patient was given medication for breakthrough pain and followup with her PCP      Gwyneth Sprout, MD 02/16/12 [id number]  Gwyneth Sprout, MD 02/16/12 2139  Gwyneth Sprout, MD 03/14/12 1600

## 2012-02-16 NOTE — ED Notes (Signed)
Pt sts that 3 weeks ago she fell and now is having chest pain. sts hurts more when she breathes and moves. sts also her head hurts and she did hit her head when she fell. sts unable to lift anything or walk. Associated with SOB and nausea.

## 2012-02-16 NOTE — ED Notes (Signed)
Troponin results per POCT Protocols   CTnI:   0.00 ng/mL

## 2012-02-17 LAB — POCT I-STAT TROPONIN I

## 2012-02-22 DIAGNOSIS — J690 Pneumonitis due to inhalation of food and vomit: Secondary | ICD-10-CM

## 2012-02-22 HISTORY — DX: Pneumonitis due to inhalation of food and vomit: J69.0

## 2012-03-12 ENCOUNTER — Emergency Department (HOSPITAL_COMMUNITY): Payer: Medicare Other

## 2012-03-12 ENCOUNTER — Inpatient Hospital Stay (HOSPITAL_COMMUNITY)
Admission: EM | Admit: 2012-03-12 | Discharge: 2012-03-13 | DRG: 917 | Disposition: A | Discharge status: Home / Self Care | Payer: Medicare Other | Attending: Internal Medicine | Admitting: Internal Medicine

## 2012-03-12 ENCOUNTER — Encounter (HOSPITAL_COMMUNITY): Payer: Self-pay | Admitting: *Deleted

## 2012-03-12 DIAGNOSIS — G894 Chronic pain syndrome: Secondary | ICD-10-CM | POA: Diagnosis present

## 2012-03-12 DIAGNOSIS — Z886 Allergy status to analgesic agent status: Secondary | ICD-10-CM

## 2012-03-12 DIAGNOSIS — M549 Dorsalgia, unspecified: Secondary | ICD-10-CM | POA: Diagnosis present

## 2012-03-12 DIAGNOSIS — Z90710 Acquired absence of both cervix and uterus: Secondary | ICD-10-CM

## 2012-03-12 DIAGNOSIS — R0989 Other specified symptoms and signs involving the circulatory and respiratory systems: Secondary | ICD-10-CM | POA: Diagnosis present

## 2012-03-12 DIAGNOSIS — J690 Pneumonitis due to inhalation of food and vomit: Secondary | ICD-10-CM | POA: Diagnosis present

## 2012-03-12 DIAGNOSIS — Z79899 Other long term (current) drug therapy: Secondary | ICD-10-CM

## 2012-03-12 DIAGNOSIS — G8929 Other chronic pain: Secondary | ICD-10-CM | POA: Diagnosis present

## 2012-03-12 DIAGNOSIS — Z23 Encounter for immunization: Secondary | ICD-10-CM

## 2012-03-12 DIAGNOSIS — I10 Essential (primary) hypertension: Secondary | ICD-10-CM | POA: Diagnosis present

## 2012-03-12 DIAGNOSIS — Z9181 History of falling: Secondary | ICD-10-CM

## 2012-03-12 DIAGNOSIS — Z882 Allergy status to sulfonamides status: Secondary | ICD-10-CM

## 2012-03-12 DIAGNOSIS — F17200 Nicotine dependence, unspecified, uncomplicated: Secondary | ICD-10-CM | POA: Diagnosis present

## 2012-03-12 DIAGNOSIS — Y92009 Unspecified place in unspecified non-institutional (private) residence as the place of occurrence of the external cause: Secondary | ICD-10-CM

## 2012-03-12 DIAGNOSIS — R4182 Altered mental status, unspecified: Secondary | ICD-10-CM

## 2012-03-12 DIAGNOSIS — Z880 Allergy status to penicillin: Secondary | ICD-10-CM

## 2012-03-12 DIAGNOSIS — G9340 Encephalopathy, unspecified: Secondary | ICD-10-CM | POA: Diagnosis present

## 2012-03-12 DIAGNOSIS — Z8661 Personal history of infections of the central nervous system: Secondary | ICD-10-CM

## 2012-03-12 DIAGNOSIS — T4271XA Poisoning by unspecified antiepileptic and sedative-hypnotic drugs, accidental (unintentional), initial encounter: Principal | ICD-10-CM | POA: Diagnosis present

## 2012-03-12 DIAGNOSIS — R0609 Other forms of dyspnea: Secondary | ICD-10-CM | POA: Diagnosis present

## 2012-03-12 DIAGNOSIS — N289 Disorder of kidney and ureter, unspecified: Secondary | ICD-10-CM | POA: Diagnosis present

## 2012-03-12 DIAGNOSIS — Z888 Allergy status to other drugs, medicaments and biological substances status: Secondary | ICD-10-CM

## 2012-03-12 DIAGNOSIS — I498 Other specified cardiac arrhythmias: Secondary | ICD-10-CM | POA: Diagnosis present

## 2012-03-12 HISTORY — DX: Other chronic pain: G89.29

## 2012-03-12 HISTORY — DX: Dorsalgia, unspecified: M54.9

## 2012-03-12 LAB — CBC
Platelets: 181 10*3/uL (ref 150–400)
RBC: 3.9 MIL/uL (ref 3.87–5.11)
RDW: 14 % (ref 11.5–15.5)
WBC: 5.6 10*3/uL (ref 4.0–10.5)

## 2012-03-12 LAB — URINALYSIS, ROUTINE W REFLEX MICROSCOPIC
Bilirubin Urine: NEGATIVE
Hgb urine dipstick: NEGATIVE
Ketones, ur: NEGATIVE mg/dL
Nitrite: NEGATIVE
Specific Gravity, Urine: 1.014 (ref 1.005–1.030)
pH: 6 (ref 5.0–8.0)

## 2012-03-12 LAB — PROTIME-INR: INR: 0.9 (ref 0.00–1.49)

## 2012-03-12 LAB — COMPREHENSIVE METABOLIC PANEL
ALT: 15 U/L (ref 0–35)
AST: 26 U/L (ref 0–37)
Albumin: 3.3 g/dL — ABNORMAL LOW (ref 3.5–5.2)
CO2: 29 mEq/L (ref 19–32)
Calcium: 10.1 mg/dL (ref 8.4–10.5)
Chloride: 105 mEq/L (ref 96–112)
GFR calc non Af Amer: 41 mL/min — ABNORMAL LOW (ref >90)
Sodium: 141 mEq/L (ref 135–145)
Total Bilirubin: 0.1 mg/dL — ABNORMAL LOW (ref 0.3–1.2)

## 2012-03-12 LAB — POCT I-STAT 3, ART BLOOD GAS (G3+)
Acid-Base Excess: 2 mmol/L (ref 0.0–2.0)
Bicarbonate: 27.6 mEq/L — ABNORMAL HIGH (ref 20.0–24.0)
O2 Saturation: 82 %
TCO2: 29 mmol/L (ref 0–100)
pCO2 arterial: 49.2 mmHg — ABNORMAL HIGH (ref 35.0–45.0)
pO2, Arterial: 49 mmHg — ABNORMAL LOW (ref 80.0–100.0)

## 2012-03-12 LAB — RAPID URINE DRUG SCREEN, HOSP PERFORMED
Amphetamines: NOT DETECTED
Benzodiazepines: POSITIVE — AB
Opiates: NOT DETECTED
Tetrahydrocannabinol: NOT DETECTED

## 2012-03-12 LAB — SALICYLATE LEVEL: Salicylate Lvl: <2 mg/dL — ABNORMAL LOW (ref 2.8–20.0)

## 2012-03-12 LAB — ACETAMINOPHEN LEVEL: Acetaminophen (Tylenol), Serum: <15 ug/mL (ref 10–30)

## 2012-03-12 LAB — TROPONIN I: Troponin I: <0.3 ng/mL (ref <0.30)

## 2012-03-12 MED ORDER — METHADONE HCL 10 MG PO TABS
20.0000 mg | ORAL_TABLET | Freq: Three times a day (TID) | ORAL | Status: DC
  Administered 2012-03-13: 20 mg via ORAL
  Filled 2012-03-12: qty 2

## 2012-03-12 MED ORDER — SODIUM CHLORIDE 0.9 % IV SOLN
INTRAVENOUS | Status: DC
  Administered 2012-03-12: 21:00:00 via INTRAVENOUS
  Administered 2012-03-13: 100 mL/h via INTRAVENOUS

## 2012-03-12 MED ORDER — MOXIFLOXACIN HCL IN NACL 400 MG/250ML IV SOLN
400.0000 mg | INTRAVENOUS | Status: DC
  Administered 2012-03-13: 400 mg via INTRAVENOUS
  Filled 2012-03-12: qty 250

## 2012-03-12 MED ORDER — SODIUM CHLORIDE 0.9 % IV SOLN
Freq: Once | INTRAVENOUS | Status: AC
  Administered 2012-03-12: 19:00:00 via INTRAVENOUS

## 2012-03-12 MED ORDER — NALOXONE HCL 0.4 MG/ML IJ SOLN
0.2000 mg | Freq: Once | INTRAMUSCULAR | Status: AC
  Administered 2012-03-12: 0.2 mg via INTRAVENOUS
  Filled 2012-03-12: qty 1

## 2012-03-12 MED ORDER — ALPRAZOLAM 0.25 MG PO TABS
0.2500 mg | ORAL_TABLET | Freq: Two times a day (BID) | ORAL | Status: DC
  Administered 2012-03-12: 0.25 mg via ORAL
  Filled 2012-03-12 (×2): qty 1

## 2012-03-12 MED ORDER — SACCHAROMYCES BOULARDII 250 MG PO CAPS
250.0000 mg | ORAL_CAPSULE | Freq: Two times a day (BID) | ORAL | Status: DC
  Administered 2012-03-12 – 2012-03-13 (×2): 250 mg via ORAL
  Filled 2012-03-12 (×3): qty 1

## 2012-03-12 MED ORDER — ONDANSETRON HCL 4 MG PO TABS: 4.0000 mg | ORAL_TABLET | Freq: Four times a day (QID) | ORAL | Status: DC | PRN

## 2012-03-12 MED ORDER — NALOXONE HCL 0.4 MG/ML IJ SOLN
0.2000 mg | Freq: Once | INTRAMUSCULAR | Status: AC
  Administered 2012-03-12: 0.2 mg via INTRAVENOUS

## 2012-03-12 MED ORDER — GABAPENTIN 300 MG PO CAPS
300.0000 mg | ORAL_CAPSULE | Freq: Every day | ORAL | Status: DC
  Filled 2012-03-12: qty 1

## 2012-03-12 MED ORDER — CLINDAMYCIN PHOSPHATE 600 MG/50ML IV SOLN
600.0000 mg | Freq: Four times a day (QID) | INTRAVENOUS | Status: DC
  Administered 2012-03-12 – 2012-03-13 (×3): 600 mg via INTRAVENOUS
  Filled 2012-03-12 (×5): qty 50

## 2012-03-12 MED ORDER — ADULT MULTIVITAMIN W/MINERALS CH
1.0000 | ORAL_TABLET | Freq: Every day | ORAL | Status: DC
  Administered 2012-03-13: 1 via ORAL
  Filled 2012-03-12 (×2): qty 1

## 2012-03-12 MED ORDER — ACETAMINOPHEN 325 MG PO TABS
650.0000 mg | ORAL_TABLET | Freq: Once | ORAL | Status: DC
  Filled 2012-03-12: qty 2

## 2012-03-12 MED ORDER — ENOXAPARIN SODIUM 40 MG/0.4ML ~~LOC~~ SOLN
40.0000 mg | SUBCUTANEOUS | Status: DC
  Filled 2012-03-12 (×2): qty 0.4

## 2012-03-12 MED ORDER — LEVOFLOXACIN IN D5W 750 MG/150ML IV SOLN
750.0000 mg | INTRAVENOUS | Status: DC
  Administered 2012-03-12: 750 mg via INTRAVENOUS
  Filled 2012-03-12 (×2): qty 150

## 2012-03-12 MED ORDER — SODIUM CHLORIDE 0.9 % IJ SOLN
3.0000 mL | Freq: Two times a day (BID) | INTRAMUSCULAR | Status: DC
  Administered 2012-03-13: 3 mL via INTRAVENOUS

## 2012-03-12 MED ORDER — ONDANSETRON HCL 4 MG/2ML IJ SOLN: 4.0000 mg | Freq: Four times a day (QID) | INTRAMUSCULAR | Status: DC | PRN

## 2012-03-12 NOTE — ED Notes (Signed)
ED MD, Dr. Lorenso Courier at bedside talking with family. Also aware of continued unresponsiveness but will pull away from painful stimuli. RT notified regarding ABG.

## 2012-03-12 NOTE — ED Notes (Signed)
Attempted to call report. Will call back.  

## 2012-03-12 NOTE — ED Notes (Addendum)
Found by family member on kitchen floor not responding. Upon EMS arrival pt lethargic, moaning, groaning. Family reports pt has Fx sternum but lost her pain pills & therefore has been giving her his methadone 10mg  pills. Family member at scene very reluctant to answer questions for EMS

## 2012-03-12 NOTE — ED Notes (Signed)
Patient transported to CT 

## 2012-03-12 NOTE — ED Notes (Signed)
MD at bedside. 

## 2012-03-12 NOTE — ED Notes (Addendum)
Pt awake, eyes opened, sitting up in bed. NAD. Resp e/u, no distress. POX >98% 4L [place].  Pt denies suicidal attempt, suicidal thoughts, ideations. Denies HI. Pt oriented to person & place only presently.

## 2012-03-12 NOTE — ED Notes (Signed)
Pt. Has become responsive to staff. She is attempting to get out of bed stating she needs to urinate. Is able to answer to person and place. Unable to recall situation prior to ED arrival. Vital signs w/in normal limits

## 2012-03-12 NOTE — H&P (Signed)
[facility] History and Physical  [name] [name] JXB:[id number] DOB: 1946-04-19 DOA: 03/12/2012  Referring physician: Dr. Lorenso Courier PCP: Lenora Boys, MD    Chief Complaint: Decreased responsiveness/altered mental status  HPI: [name] [name] is a 66 y.o. female history significant for chronic back pain/chronic pain syndrome status post back surgery in the past -on methadone, history of fall last week with injury to sternum, hypertension who presents with above complaints. The history is obtained from her husband and ED staff/records. Her husband states that he found her on the kitchen floor slumped over and she was unresponsive and so he called EMS. Per the ED records patient was found to have inside her clothing a pill bottle with her husband's name on the label with a mix of different pills. Husband also states that he found an old bottle of soma that was his prescription but is not sure whether she took any of it. Upon arrival in the ED the patient had a CT scan of the head and cervical spine were negative for acute findings. EKG showed sinus bradycardia at a rate of 54 no acute ischemic changes and troponin was negative. A urine drug screen was done and was positive for benzodiazepines only (which is one of her prescription medications). Patient was maintaining her airway in ED-O2 sats on nasal cannula oxygen 99 -100%, but only minimally responsive to noxious stimuli per EDP and so she was given Narcan and she seemed to be moving of the bit more but was still minimally responsive. At the time of my exam patient was somnolent but easily aroused and verbally responsive. Per husband she has not voiced any suicidal ideations, although he reports she has had recent death of her sister her father and mother.  A chest x-ray in the ED showed right perihilar and right infrahilar streaky airspace disease-evolving infiltrate not excluded. Patient admits to cough.She is admitted for further evaluation and  management   Review of Systems: No reports of fever, weight loss,, vision loss, decreased hearing, hoarseness, chest pain, syncope, dyspnea on exertion, peripheral edema, hemoptysis, abdominal pain, melena, hematochezia, severe indigestion/heartburn, hematuria, incontinence, genital sores, muscle weakness, suspicious skin lesions, transient blindness, , unusual weight change, abnormal bleeding, enlarged lymph nodes.   Past Medical History  Diagnosis Date  . Arachnoiditis   . Hypertension   . Chronic back pain    history of recent fall with injury to sternum Past Surgical History  Procedure Date  . Back surgery   . Abdominal hysterectomy   . Appendectomy    Social History:  reports that she has been smoking.  She does not have any smokeless tobacco history on file. She reports that she does not drink alcohol or use illicit drugs. where does patient live--home with her husband   Allergies  Allergen Reactions  . Penicillins Anaphylaxis  . Sulfa Antibiotics Anaphylaxis  . Sertraline Hcl Other (See Comments)    Caused pt to be hyper and confussed  . Toradol (Ketorolac Tromethamine) Nausea And Vomiting  . Codeine Itching and Rash    No family history on file. unobtainable  Prior to Admission medications   Medication Sig Start Date End Date Taking? Authorizing Provider  albuterol (PROVENTIL HFA;VENTOLIN HFA) 108 (90 BASE) MCG/ACT inhaler Inhale 2 puffs into the lungs every 6 (six) hours as needed. For shortness of breath   Yes Historical Provider, MD  diphenhydrAMINE (BENADRYL) 25 MG tablet Take 25 mg by mouth at bedtime as needed. For allergy symptoms   Yes  Historical Provider, MD  gabapentin (NEURONTIN) 300 MG capsule Take 300 mg by mouth at bedtime.   Yes Historical Provider, MD  meclizine (ANTIVERT) 25 MG tablet Take 25 mg by mouth daily as needed. For vertigo   Yes Historical Provider, MD  Multiple Vitamin (MULTIVITAMIN WITH MINERALS) TABS Take 1 tablet by mouth daily.   Yes  Historical Provider, MD  ALPRAZolam Prudy Feeler) 1 MG tablet Take 1 mg by mouth 4 (four) times daily. For anxiety    Historical Provider, MD  lisinopril-hydrochlorothiazide (PRINZIDE,ZESTORETIC) 10-12.5 MG per tablet Take 2 tablets by mouth daily.    Historical Provider, MD  methadone (DOLOPHINE) 10 MG tablet Take 50 mg by mouth 4 (four) times daily - after meals and at bedtime.    Historical Provider, MD   Physical Exam: Filed Vitals:   03/12/12 1730 03/12/12 1744 03/12/12 1800 03/12/12 1830  BP: 119/69 119/69 131/76 126/69  Pulse: 54  64 76  Temp: 95 F (35 C) 95.2 F (35.1 C) 95.4 F (35.2 C) 95.5 F (35.3 C)  TempSrc:  Core (Comment)    Resp: 27 16 18 20   SpO2: 99% 99% 100% 100%   Constitutional: Vital signs reviewed.  Patient is a well-developed and well-nourished respiratory distress with nasal trumpet in place. Somnolent but easily aroused oriented x2 Head: Normocephalic and atraumatic Mouth: no erythema or exudates, dry MMM Eyes: PERRL, EOMI, conjunctivae normal, No scleral icterus.  Neck: Supple, Trachea midline normal ROM, No JVD, mass, thyromegaly, or carotid bruit present.  Cardiovascular: RRR, S1 normal, S2 normal, no MRG, pulses symmetric and intact bilaterally Pulmonary/Chest: CTAB, no wheezes, rales, or rhonchi Abdominal: Soft. Non-tender, non-distended, bowel sounds are normal, no masses, organomegaly, or guarding present.  GU: no CVA tenderness Musculoskeletal: No joint deformities, erythema, or stiffness, ROM full and no nontender Hematology: no cervical, inginal, or axillary adenopathy.  Neurological: A&O x2, follow simple commands, moving all extremities grossly, cranial nerve II-XII are grossly intact, no focal motor deficit, sensory intact to light touch bilaterally.  Skin: Warm, dry and intact. No rash.    Labs on Admission:  Basic Metabolic Panel:  Lab 03/12/12 9604  NA 141  K 3.8  CL 105  CO2 29  GLUCOSE 75  BUN 22  CREATININE 1.32*  CALCIUM 10.1  MG  --  PHOS --   Liver Function Tests:  Lab 03/12/12 1438  AST 26  ALT 15  ALKPHOS 169*  BILITOT 0.1*  PROT 7.1  ALBUMIN 3.3*   No results found for this basename: LIPASE:5,AMYLASE:5 in the last 168 hours No results found for this basename: AMMONIA:5 in the last 168 hours CBC:  Lab 03/12/12 1438  WBC 5.6  NEUTROABS --  HGB 11.9*  HCT 36.7  MCV 94.1  PLT 181   Cardiac Enzymes:  Lab 03/12/12 1439  CKTOTAL --  CKMB --  CKMBINDEX --  TROPONINI <0.30    BNP (last 3 results) No results found for this basename: PROBNP:3 in the last 8760 hours CBG:  Lab 03/12/12 1613  GLUCAP 133*    Radiological Exams on Admission: Ct Head Wo Contrast  03/12/2012  *RADIOLOGY REPORT*  Clinical Data:  Altered mental status  CT HEAD WITHOUT CONTRAST CT CERVICAL SPINE WITHOUT CONTRAST  Technique:  Multidetector CT imaging of the head and cervical spine was performed following the standard protocol without intravenous contrast.  Multiplanar CT image reconstructions of the cervical spine were also generated.  Comparison: 05/03/2011  CT HEAD  Findings: There is diffuse patchy low density  throughout the subcortical and periventricular white matter consistent with chronic small vessel ischemic change.  Calcifications within the right cerebellar hemisphere noted.  The paranasal sinuses appear clear.  The mastoid air cells appear clear.  The skull appears intact.  IMPRESSION:  1.  Small vessel ischemic change and brain atrophy. 2.  No acute intracranial abnormalities.  CT CERVICAL SPINE  Findings: Normal alignment of the cervical spine.  Vertebral body heights are well preserved.  Bilateral facet degenerative changes noted.  Disc space narrowing and endplate spurring is noted at C5- 6.  Bilateral calcified atherosclerotic disease involves the carotid arteries. Biapical pleural parenchymal scarring is identified.  IMPRESSION:  1.  Cervical spondylosis. 2.  No acute fracture or subluxation.   Original Report  Authenticated By: Rosealee Albee, M.D.    Ct Cervical Spine Wo Contrast  03/12/2012  *RADIOLOGY REPORT*  Clinical Data:  Altered mental status  CT HEAD WITHOUT CONTRAST CT CERVICAL SPINE WITHOUT CONTRAST  Technique:  Multidetector CT imaging of the head and cervical spine was performed following the standard protocol without intravenous contrast.  Multiplanar CT image reconstructions of the cervical spine were also generated.  Comparison: 05/03/2011  CT HEAD  Findings: There is diffuse patchy low density throughout the subcortical and periventricular white matter consistent with chronic small vessel ischemic change.  Calcifications within the right cerebellar hemisphere noted.  The paranasal sinuses appear clear.  The mastoid air cells appear clear.  The skull appears intact.  IMPRESSION:  1.  Small vessel ischemic change and brain atrophy. 2.  No acute intracranial abnormalities.  CT CERVICAL SPINE  Findings: Normal alignment of the cervical spine.  Vertebral body heights are well preserved.  Bilateral facet degenerative changes noted.  Disc space narrowing and endplate spurring is noted at C5- 6.  Bilateral calcified atherosclerotic disease involves the carotid arteries. Biapical pleural parenchymal scarring is identified.  IMPRESSION:  1.  Cervical spondylosis. 2.  No acute fracture or subluxation.   Original Report Authenticated By: Rosealee Albee, M.D.    Dg Chest Port 1 View  03/12/2012  *RADIOLOGY REPORT*  Clinical Data: Altered mental status  PORTABLE CHEST - 1 VIEW  Comparison: 02/16/2012  Findings: Cardiomediastinal silhouette is stable.  There is  right perihilar and infrahilar streaky airspace disease.  Evolving infiltrate cannot be excluded.  Mild left basilar atelectasis.  No convincing pulmonary edema.  IMPRESSION: Right perihilar and right infrahilar streaky airspace disease. Evolving infiltrate cannot be excluded.  Follow-up examination is recommended.  Mild left basilar atelectasis.    Original Report Authenticated By: Natasha Mead, M.D.     EKG: Independently reviewed. -Sinus bradycardia at 54, no acute ischemic changes   Assessment/Plan Active Problems: Encephalopathy, acute/probable accidental overdose -She is on significant doses of methadone, alprazolam, oxycodone when necessary in this the question of whether or not she took soma. Also other sedating meds on her med rec -Benadryl, gabapentin and Antivert -Will hold off on both meds for tonight, resume methadone, alprazolam at lower dose with hold parameters in a.m. -Close monitoring in step down unit -also Sitter for suicide precautions till able to get a better history from patient/cleared by psych-consult psych in a.m.   Probable Aspiration pneumonia -Empiric antibiotics with Avelox and clindamycin (patient allergic to penicillins.  Acute renal insufficiency -Likely prerenal, hold lisinopril/HCTZ -Hydrate follow and recheck Hypertension -Monitor blood pressures, and further treat accordingly. Holding off lisinopril/HCTZ as above for now Chronic back pain Bradycardia, mild -Secondary to #1, monitor and further treat accordingly -Also  obtain cardiac enzymes and follow. History of recent fall with injury to sternum Code Status: presumed Family Communication:husband via phone at [phone] Disposition Plan: admit to step down  Time spent: >32mins  Kela Millin [facility] Pager [phone]  If 7PM-7AM, please contact night-coverage [email or web address] Password [email or web address] 03/12/2012, 6:48 PM

## 2012-03-12 NOTE — ED Notes (Signed)
Pt. Husbands Zollie Beckers) Cell- [phone]

## 2012-03-12 NOTE — ED Notes (Signed)
Pt returned from CT scanner, family at bedside.

## 2012-03-12 NOTE — Progress Notes (Signed)
Received report from ED RN who stated that patient can not answer questions re suicidal ideation vs accidental overdose,admitting physician Dr. Donna Bernard was called and MD ordered suicide precautions for this patient.  administrative coordinator Victorino Dike called and aware of order for suicide precautions, Berle Mull RN

## 2012-03-12 NOTE — ED Notes (Signed)
Resp e/u, no distress. Oxygen decreased to 4L [name] [name], POX >99%. Pt opens eyes to name only, will not answer any questions.

## 2012-03-12 NOTE — ED Provider Notes (Addendum)
History     CSN: [id number]  Arrival date & time 03/12/12  1346   First MD Initiated Contact with Patient 03/12/12 1433      Chief Complaint  Patient presents with  . Altered Mental Status    (Consider location/radiation/quality/duration/timing/severity/associated sxs/prior treatment) HPI Comments: Mrs. Croson presents via EMS for evaluation.  Her husband states he found her laying in the floor.  Inside her clothing was a pill bottle with his name on the label and a hodge-podge of pills.  She has some issues with chronic pain and takes methadone daily.  He states she has never had any issues with accidentally overdosing.  He adds that she has had the recent deaths of her sister, father, and mother, but has never voiced suicidal ideation.  He found an old prescription for soma in her bed and was concerned she may have taken some.  She had a fall last week and was treated at an outside hospital for an injury to her sternum.  Patient is a 66 y.o. female presenting with altered mental status. The history is provided by the patient. No language interpreter was used.  Altered Mental Status This is a new problem. The current episode started 6 to 12 hours ago. The problem has not changed since onset.Pertinent negatives include no chest pain, no abdominal pain, no headaches and no shortness of breath. Nothing aggravates the symptoms. Nothing relieves the symptoms. She has tried nothing for the symptoms.    Past Medical History  Diagnosis Date  . Arachnoiditis   . Hypertension   . Chronic back pain     Past Surgical History  Procedure Date  . Back surgery   . Abdominal hysterectomy   . Appendectomy     No family history on file.  History  Substance Use Topics  . Smoking status: Current Every Day Smoker  . Smokeless tobacco: Not on file  . Alcohol Use: No    OB History    Grav Para Term Preterm Abortions TAB SAB Ect Mult Living                  Review of Systems  Unable to  perform ROS Respiratory: Negative for shortness of breath.   Cardiovascular: Negative for chest pain.  Gastrointestinal: Negative for abdominal pain.  Neurological: Negative for headaches.  Psychiatric/Behavioral: Positive for altered mental status.    Allergies  Penicillins; Sulfa antibiotics; Sertraline hcl; Toradol; and Codeine  Home Medications   Current Outpatient Rx  Name Route Sig Dispense Refill  . ALPRAZOLAM 1 MG PO TABS Oral Take 1 mg by mouth daily as needed. For anxiety    . LISINOPRIL-HYDROCHLOROTHIAZIDE 10-12.5 MG PO TABS Oral Take 2 tablets by mouth daily.    Marland Kitchen METHADONE HCL 10 MG PO TABS Oral Take 50 mg by mouth 4 (four) times daily - after meals and at bedtime.    . OXYCODONE HCL 5 MG PO TABS Oral Take 1 tablet (5 mg total) by mouth every 6 (six) hours as needed for pain. 20 tablet 0    BP 126/65  Pulse 62  Temp 94.2 F (34.6 C) (Rectal)  Resp 22  SpO2 99%  Physical Exam  Nursing note and vitals reviewed. Constitutional: She appears well-developed and well-nourished. She appears lethargic.  Non-toxic appearance. No distress. She is not intubated.  HENT:  Head: Normocephalic and atraumatic. Head is without raccoon's eyes, without Battle's sign, without contusion, without laceration, without right periorbital erythema and without left periorbital erythema. No  trismus in the jaw.  Right Ear: External ear normal. No mastoid tenderness. No hemotympanum.  Left Ear: External ear normal. No mastoid tenderness. No hemotympanum.  Nose: Nose normal. No nose lacerations, sinus tenderness, nasal deformity or nasal septal hematoma.  Mouth/Throat: Uvula is midline, oropharynx is clear and moist and mucous membranes are normal. Mucous membranes are not pale, not dry and not cyanotic. She has dentures. No uvula swelling. No oropharyngeal exudate, posterior oropharyngeal edema, posterior oropharyngeal erythema or tonsillar abscesses.  Eyes: Conjunctivae normal, EOM and lids are  normal. Pupils are equal, round, and reactive to light. Right eye exhibits no discharge and no exudate. Left eye exhibits no discharge and no exudate. Right conjunctiva is not injected. Right conjunctiva has no hemorrhage. Left conjunctiva is not injected. Left conjunctiva has no hemorrhage. No scleral icterus. Right eye exhibits normal extraocular motion and no nystagmus. Left eye exhibits no nystagmus. Right pupil is round and reactive. Left pupil is round and reactive. Pupils are equal.  Neck: Normal range of motion. Neck supple. No tracheal tenderness, no spinous process tenderness and no muscular tenderness present. No rigidity. No tracheal deviation, no erythema and normal range of motion present. No Brudzinski's sign and no Kernig's sign noted. No mass and no thyromegaly present.  Cardiovascular: Normal rate, regular rhythm, S1 normal, S2 normal, normal heart sounds, intact distal pulses and normal pulses.  PMI is not displaced.  Exam reveals no gallop and no decreased pulses.   No murmur heard. Pulmonary/Chest: Effort normal and breath sounds normal. No accessory muscle usage or stridor. No apnea, not tachypneic and not bradypneic. She is not intubated. No respiratory distress. She has no decreased breath sounds. She has no wheezes. She has no rhonchi. She has no rales.  Abdominal: Normal appearance and bowel sounds are normal. She exhibits no abdominal bruit, no ascites, no pulsatile midline mass and no mass. There is no hepatosplenomegaly. There is no tenderness. There is no rigidity, no guarding, no CVA tenderness, no tenderness at McBurney's point and negative Murphy's sign.  Genitourinary: No labial fusion. There is no rash, lesion or injury on the right labia. There is no rash, lesion or injury on the left labia.  Musculoskeletal: Normal range of motion. She exhibits no edema and no tenderness.  Neurological: She appears lethargic. GCS eye subscore is 2. GCS verbal subscore is 2. GCS motor  subscore is 5.  Skin: She is not diaphoretic.    ED Course  Procedures (including critical care time)  Labs Reviewed  CBC - Abnormal; Notable for the following:    Hemoglobin 11.9 (*)     All other components within normal limits  COMPREHENSIVE METABOLIC PANEL - Abnormal; Notable for the following:    Creatinine, Ser 1.32 (*)     Albumin 3.3 (*)     Alkaline Phosphatase 169 (*)     Total Bilirubin 0.1 (*)     GFR calc non Af Amer 41 (*)     GFR calc Af Amer 48 (*)     All other components within normal limits  GLUCOSE, CAPILLARY - Abnormal; Notable for the following:    Glucose-Capillary 133 (*)     All other components within normal limits  PROTIME-INR  ETHANOL  TROPONIN I  URINE RAPID DRUG SCREEN (HOSP PERFORMED)  URINALYSIS, ROUTINE W REFLEX MICROSCOPIC  CULTURE, BLOOD (ROUTINE X 2)  CULTURE, BLOOD (ROUTINE X 2)   Ct Head Wo Contrast  03/12/2012  *RADIOLOGY REPORT*  Clinical Data:  Altered mental status  CT HEAD WITHOUT CONTRAST CT CERVICAL SPINE WITHOUT CONTRAST  Technique:  Multidetector CT imaging of the head and cervical spine was performed following the standard protocol without intravenous contrast.  Multiplanar CT image reconstructions of the cervical spine were also generated.  Comparison: 05/03/2011  CT HEAD  Findings: There is diffuse patchy low density throughout the subcortical and periventricular white matter consistent with chronic small vessel ischemic change.  Calcifications within the right cerebellar hemisphere noted.  The paranasal sinuses appear clear.  The mastoid air cells appear clear.  The skull appears intact.  IMPRESSION:  1.  Small vessel ischemic change and brain atrophy. 2.  No acute intracranial abnormalities.  CT CERVICAL SPINE  Findings: Normal alignment of the cervical spine.  Vertebral body heights are well preserved.  Bilateral facet degenerative changes noted.  Disc space narrowing and endplate spurring is noted at C5- 6.  Bilateral calcified  atherosclerotic disease involves the carotid arteries. Biapical pleural parenchymal scarring is identified.  IMPRESSION:  1.  Cervical spondylosis. 2.  No acute fracture or subluxation.   Original Report Authenticated By: Rosealee Albee, M.D.    Ct Cervical Spine Wo Contrast  03/12/2012  *RADIOLOGY REPORT*  Clinical Data:  Altered mental status  CT HEAD WITHOUT CONTRAST CT CERVICAL SPINE WITHOUT CONTRAST  Technique:  Multidetector CT imaging of the head and cervical spine was performed following the standard protocol without intravenous contrast.  Multiplanar CT image reconstructions of the cervical spine were also generated.  Comparison: 05/03/2011  CT HEAD  Findings: There is diffuse patchy low density throughout the subcortical and periventricular white matter consistent with chronic small vessel ischemic change.  Calcifications within the right cerebellar hemisphere noted.  The paranasal sinuses appear clear.  The mastoid air cells appear clear.  The skull appears intact.  IMPRESSION:  1.  Small vessel ischemic change and brain atrophy. 2.  No acute intracranial abnormalities.  CT CERVICAL SPINE  Findings: Normal alignment of the cervical spine.  Vertebral body heights are well preserved.  Bilateral facet degenerative changes noted.  Disc space narrowing and endplate spurring is noted at C5- 6.  Bilateral calcified atherosclerotic disease involves the carotid arteries. Biapical pleural parenchymal scarring is identified.  IMPRESSION:  1.  Cervical spondylosis. 2.  No acute fracture or subluxation.   Original Report Authenticated By: Rosealee Albee, M.D.    Dg Chest Port 1 View  03/12/2012  *RADIOLOGY REPORT*  Clinical Data: Altered mental status  PORTABLE CHEST - 1 VIEW  Comparison: 02/16/2012  Findings: Cardiomediastinal silhouette is stable.  There is  right perihilar and infrahilar streaky airspace disease.  Evolving infiltrate cannot be excluded.  Mild left basilar atelectasis.  No convincing  pulmonary edema.  IMPRESSION: Right perihilar and right infrahilar streaky airspace disease. Evolving infiltrate cannot be excluded.  Follow-up examination is recommended.  Mild left basilar atelectasis.   Original Report Authenticated By: Natasha Mead, M.D.      No diagnosis found.    MDM  Pt presents for evaluation of altered mental status.  Note stable vital signs.  Pt appears somnolent but will attempt to open her eyes when her name is called.  Her mental status only improved slightly after receiving narcan.  Plan comprehensive labs, tox screen, CXR, CT head of head and c spine.    1615.  Cont altered mental status.  Nasal trumpet is in place.  She only grimaces to noxious stimuli.  Note increases RR.  Blood gas has been ordered.  If hypercarbia or  low O2 level noted, plan intubation. Note abnl opacity on xray concerning for an infiltrate.  Cultures and levaquin ordered.  1730.  Pt has an intact gag reflex.  Considered intubation for airway protection, however she has maintained a constant RR and O2 sat.  There is no evidence of an acute traumatic injury on exam.  Suspect she has overdosed on sedatives, benzos, or narcotics.  Discussed her evaluation with the on-call hospitalist provider, plan admit for further evaluation.  CRITICAL CARE Performed by: Dana Allan T   Total critical care time: 45+  Critical care time was exclusive of separately billable procedures and treating other patients.  Critical care was necessary to treat or prevent imminent or life-threatening deterioration.  Critical care was time spent personally by me on the following activities: development of treatment plan with patient and/or surrogate as well as nursing, discussions with consultants, evaluation of patient's response to treatment, examination of patient, obtaining history from patient or surrogate, ordering and performing treatments and interventions, ordering and review of laboratory studies, ordering and  review of radiographic studies, pulse oximetry and re-evaluation of patient's condition.     Tobin Chad, MD 03/12/12 1737  Tobin Chad, MD 03/12/12 1739

## 2012-03-13 DIAGNOSIS — R4182 Altered mental status, unspecified: Secondary | ICD-10-CM

## 2012-03-13 LAB — COMPREHENSIVE METABOLIC PANEL
ALT: 16 U/L (ref 0–35)
Calcium: 9.7 mg/dL (ref 8.4–10.5)
Creatinine, Ser: 1.16 mg/dL — ABNORMAL HIGH (ref 0.50–1.10)
GFR calc Af Amer: 56 mL/min — ABNORMAL LOW (ref >90)
Glucose, Bld: 82 mg/dL (ref 70–99)
Sodium: 140 mEq/L (ref 135–145)
Total Protein: 6.3 g/dL (ref 6.0–8.3)

## 2012-03-13 LAB — CBC
HCT: 33.2 % — ABNORMAL LOW (ref 36.0–46.0)
Hemoglobin: 10.8 g/dL — ABNORMAL LOW (ref 12.0–15.0)
MCHC: 32.5 g/dL (ref 30.0–36.0)
RBC: 3.57 MIL/uL — ABNORMAL LOW (ref 3.87–5.11)

## 2012-03-13 LAB — TROPONIN I: Troponin I: <0.3 ng/mL (ref <0.30)

## 2012-03-13 MED ORDER — ALPRAZOLAM 0.5 MG PO TABS
1.0000 mg | ORAL_TABLET | Freq: Three times a day (TID) | ORAL | Status: DC | PRN
  Filled 2012-03-13: qty 2

## 2012-03-13 MED ORDER — METHADONE HCL 10 MG PO TABS
10.0000 mg | ORAL_TABLET | Freq: Every day | ORAL | Status: DC
  Administered 2012-03-13: 10 mg via ORAL
  Filled 2012-03-13: qty 1

## 2012-03-13 MED ORDER — LISINOPRIL 10 MG PO TABS
10.0000 mg | ORAL_TABLET | Freq: Every day | ORAL | Status: DC
  Administered 2012-03-13: 10 mg via ORAL
  Filled 2012-03-13: qty 1

## 2012-03-13 MED ORDER — ASPIRIN 81 MG PO CHEW: 81.0000 mg | CHEWABLE_TABLET | Freq: Every day | ORAL | Status: DC

## 2012-03-13 MED ORDER — ASPIRIN 81 MG PO CHEW
81.0000 mg | CHEWABLE_TABLET | Freq: Every day | ORAL | Status: DC
  Administered 2012-03-13: 81 mg via ORAL
  Filled 2012-03-13: qty 1

## 2012-03-13 MED ORDER — METHADONE HCL 10 MG PO TABS
40.0000 mg | ORAL_TABLET | Freq: Once | ORAL | Status: AC
  Administered 2012-03-13: 40 mg via ORAL
  Filled 2012-03-13: qty 4

## 2012-03-13 MED ORDER — ALPRAZOLAM 0.5 MG PO TABS
1.0000 mg | ORAL_TABLET | Freq: Four times a day (QID) | ORAL | Status: DC
  Administered 2012-03-13: 1 mg via ORAL

## 2012-03-13 NOTE — Discharge Summary (Signed)
DISCHARGE SUMMARY  [name] [name]  MR#: [id number]  DOB:Feb 19, 1946  Date of Admission: 03/12/2012 Date of Discharge: 03/13/2012  Attending Physician:Jerran Tappan Silvestre Gunner, MD  Patient's UXL:[id number], [name] Cheadle, MD  Consults: None  Pertinent discharge information: Patient accidentally took her husband's Soma mistaking it for methadone. This led to hypersomnolence. Clarified with patient today that she is not depressed and had no intention of harming herself.  She contracts for safety, vowing to alert her husband should she ever experience suicidal ideation.    Discharge Diagnoses:  Chronic back pain  Hypertension  Encephalopathy acute due to accidental narcotic overdose  Aspiration pneumonia  Acute renal insufficiency  Radiology:  Ct Head Wo Contrast  03/12/2012  *RADIOLOGY REPORT*  Clinical Data:  Altered mental status  CT HEAD WITHOUT CONTRAST CT CERVICAL SPINE WITHOUT CONTRAST  Technique:  Multidetector CT imaging of the head and cervical spine was performed following the standard protocol without intravenous contrast.  Multiplanar CT image reconstructions of the cervical spine were also generated.  Comparison: 05/03/2011  CT HEAD  \ IMPRESSION:  1.  Cervical spondylosis. 2.  No acute fracture or subluxation.   Original Report Authenticated By: Rosealee Albee, M.D.    Ct Angio Chest Pe W/cm &/or Wo Cm  02/16/2012  *RADIOLOGY REPORT*  Clinical Data: Chest pain, recent fall, evaluate for PE  CT ANGIOGRAPHY CHEST  Technique:  Multidetector CT imaging of the chest using the standard protocol during bolus administration of intravenous contrast. Multiplanar reconstructed images including MIPs were obtained and reviewed to evaluate the vascular anatomy.  Contrast: OMNIPAQUE IOHEXOL 350 MG/ML SOLN  Comparison: Chest radiographs dated 02/16/2012  IMPRESSION: No evidence of pulmonary embolism.  Tiny nondisplaced sternal fracture.  Mild intrahepatic ductal dilatation, correlate with LFTs.    Original Report Authenticated By: Charline Bills, M.D.    Ct Cervical Spine Wo Contrast  03/12/2012  *RADIOLOGY REPORT*  Clinical Data:  Altered mental status  CT HEAD WITHOUT CONTRAST CT CERVICAL SPINE WITHOUT CONTRAST  Technique:  Multidetector CT imaging of the head and cervical spine was performed following the standard protocol without intravenous contrast.  Multiplanar CT image reconstructions of the cervical spine were also generated.  Comparison: 05/03/2011  CT HEAD   IMPRESSION:  1.  Small vessel ischemic change and brain atrophy. 2.  No acute intracranial abnormalities.  CT CERVICAL SPINE  Findings: Normal alignment of the cervical spine.  Vertebral body heights are well preserved.  Bilateral facet degenerative changes noted.  Disc space narrowing and endplate spurring is noted at C5- 6.  Bilateral calcified atherosclerotic disease involves the carotid arteries. Biapical pleural parenchymal scarring is identified.  IMPRESSION:  1.  Cervical spondylosis. 2.  No acute fracture or subluxation.   Original Report Authenticated By: Rosealee Albee, M.D.    Dg Chest Port 1 View  03/12/2012  *RADIOLOGY REPORT*  Clinical Data: Altered mental status  PORTABLE CHEST - 1 VIEW  Comparison: 02/16/2012 IMPRESSION: Right perihilar and right infrahilar streaky airspace disease. Evolving infiltrate cannot be excluded.  Follow-up examination is recommended.  Mild left basilar atelectasis.   Original Report Authenticated By: Natasha Mead, M.D.     Laboratory: Results for orders placed during the hospital encounter of 03/12/12 (from the past 48 hour(s))  CBC     Status: Abnormal   Collection Time   03/12/12  2:38 PM      Component Value Range Comment   WBC 5.6  4.0 - 10.5 K/uL    RBC 3.90  3.87 -  5.11 MIL/uL    Hemoglobin 11.9 (*) 12.0 - 15.0 g/dL    HCT 40.9  81.1 - 91.4 %    MCV 94.1  78.0 - 100.0 fL    MCH 30.5  26.0 - 34.0 pg    MCHC 32.4  30.0 - 36.0 g/dL    RDW 78.2  95.6 - 21.3 %    Platelets  181  150 - 400 K/uL   COMPREHENSIVE METABOLIC PANEL     Status: Abnormal   Collection Time   03/12/12  2:38 PM      Component Value Range Comment   Sodium 141  135 - 145 mEq/L    Potassium 3.8  3.5 - 5.1 mEq/L    Chloride 105  96 - 112 mEq/L    CO2 29  19 - 32 mEq/L    Glucose, Bld 75  70 - 99 mg/dL    BUN 22  6 - 23 mg/dL    Creatinine, Ser 0.86 (*) 0.50 - 1.10 mg/dL    Calcium 57.8  8.4 - 10.5 mg/dL    Total Protein 7.1  6.0 - 8.3 g/dL    Albumin 3.3 (*) 3.5 - 5.2 g/dL    AST 26  0 - 37 U/L    ALT 15  0 - 35 U/L    Alkaline Phosphatase 169 (*) 39 - 117 U/L    Total Bilirubin 0.1 (*) 0.3 - 1.2 mg/dL    GFR calc non Af Amer 41 (*) >90 mL/min    GFR calc Af Amer 48 (*) >90 mL/min   PROTIME-INR     Status: Normal   Collection Time   03/12/12  2:38 PM      Component Value Range Comment   Prothrombin Time 12.1  11.6 - 15.2 seconds    INR 0.90  0.00 - 1.49   ETHANOL     Status: Normal   Collection Time   03/12/12  2:38 PM      Component Value Range Comment   Alcohol, Ethyl (B) <11  0 - 11 mg/dL   TROPONIN I     Status: Normal   Collection Time   03/12/12  2:39 PM      Component Value Range Comment   Troponin I <0.30  <0.30 ng/mL   URINE RAPID DRUG SCREEN (HOSP PERFORMED)     Status: Abnormal   Collection Time   03/12/12  3:48 PM      Component Value Range Comment   Opiates NONE DETECTED  NONE DETECTED    Cocaine NONE DETECTED  NONE DETECTED    Benzodiazepines POSITIVE (*) NONE DETECTED    Amphetamines NONE DETECTED  NONE DETECTED    Tetrahydrocannabinol NONE DETECTED  NONE DETECTED    Barbiturates NONE DETECTED  NONE DETECTED   URINALYSIS, ROUTINE W REFLEX MICROSCOPIC     Status: Normal   Collection Time   03/12/12  3:52 PM      Component Value Range Comment   Color, Urine YELLOW  YELLOW    APPearance CLEAR  CLEAR    Specific Gravity, Urine 1.014  1.005 - 1.030    pH 6.0  5.0 - 8.0    Glucose, UA NEGATIVE  NEGATIVE mg/dL    Hgb urine dipstick NEGATIVE  NEGATIVE     Bilirubin Urine NEGATIVE  NEGATIVE    Ketones, ur NEGATIVE  NEGATIVE mg/dL    Protein, ur NEGATIVE  NEGATIVE mg/dL    Urobilinogen, UA 0.2  0.0 - 1.0 mg/dL  Nitrite NEGATIVE  NEGATIVE    Leukocytes, UA NEGATIVE  NEGATIVE MICROSCOPIC NOT DONE ON URINES WITH NEGATIVE PROTEIN, BLOOD, LEUKOCYTES, NITRITE, OR GLUCOSE <1000 mg/dL.  GLUCOSE, CAPILLARY     Status: Abnormal   Collection Time   03/12/12  4:13 PM      Component Value Range Comment   Glucose-Capillary 133 (*) 70 - 99 mg/dL   POCT I-STAT 3, BLOOD GAS (G3+)     Status: Abnormal   Collection Time   03/12/12  4:34 PM      Component Value Range Comment   pH, Arterial 7.358  7.350 - 7.450    pCO2 arterial 49.2 (*) 35.0 - 45.0 mmHg    pO2, Arterial 49.0 (*) 80.0 - 100.0 mmHg    Bicarbonate 27.6 (*) 20.0 - 24.0 mEq/L    TCO2 29  0 - 100 mmol/L    O2 Saturation 82.0      Acid-Base Excess 2.0  0.0 - 2.0 mmol/L    Patient temperature 37.0 C      Collection site RADIAL, ALLEN'S TEST ACCEPTABLE      Drawn by RT      Sample type ARTERIAL     CULTURE, BLOOD (ROUTINE X 2)     Status: Normal (Preliminary result)   Collection Time   03/12/12  4:45 PM      Component Value Range Comment   Specimen Description BLOOD LEFT ARM      Special Requests BOTTLES DRAWN AEROBIC AND ANAEROBIC 10CC      Culture  Setup Time 03/12/2012 21:58      Culture        Value:        BLOOD CULTURE RECEIVED NO GROWTH TO DATE CULTURE WILL BE HELD FOR 5 DAYS BEFORE ISSUING A FINAL NEGATIVE REPORT   Report Status PENDING     CULTURE, BLOOD (ROUTINE X 2)     Status: Normal (Preliminary result)   Collection Time   03/12/12  5:00 PM      Component Value Range Comment   Specimen Description BLOOD LEFT ARM      Special Requests BOTTLES DRAWN AEROBIC AND ANAEROBIC 10CC      Culture  Setup Time 03/12/2012 21:58      Culture        Value:        BLOOD CULTURE RECEIVED NO GROWTH TO DATE CULTURE WILL BE HELD FOR 5 DAYS BEFORE ISSUING A FINAL NEGATIVE REPORT   Report  Status PENDING     ACETAMINOPHEN LEVEL     Status: Normal   Collection Time   03/12/12  6:05 PM      Component Value Range Comment   Acetaminophen (Tylenol), Serum <15.0  10 - 30 ug/mL   SALICYLATE LEVEL     Status: Abnormal   Collection Time   03/12/12  6:05 PM      Component Value Range Comment   Salicylate Lvl <2.0 (*) 2.8 - 20.0 mg/dL   MRSA PCR SCREENING     Status: Normal   Collection Time   03/12/12  9:05 PM      Component Value Range Comment   MRSA by PCR NEGATIVE  NEGATIVE   COMPREHENSIVE METABOLIC PANEL     Status: Abnormal   Collection Time   03/13/12  9:04 AM      Component Value Range Comment   Sodium 140  135 - 145 mEq/L    Potassium 4.0  3.5 - 5.1 mEq/L    Chloride  105  96 - 112 mEq/L    CO2 29  19 - 32 mEq/L    Glucose, Bld 82  70 - 99 mg/dL    BUN 17  6 - 23 mg/dL    Creatinine, Ser 4.09 (*) 0.50 - 1.10 mg/dL    Calcium 9.7  8.4 - 81.1 mg/dL    Total Protein 6.3  6.0 - 8.3 g/dL    Albumin 2.9 (*) 3.5 - 5.2 g/dL    AST 38 (*) 0 - 37 U/L    ALT 16  0 - 35 U/L    Alkaline Phosphatase 144 (*) 39 - 117 U/L    Total Bilirubin 0.2 (*) 0.3 - 1.2 mg/dL    GFR calc non Af Amer 48 (*) >90 mL/min    GFR calc Af Amer 56 (*) >90 mL/min   CBC     Status: Abnormal   Collection Time   03/13/12  9:04 AM      Component Value Range Comment   WBC 6.3  4.0 - 10.5 K/uL    RBC 3.57 (*) 3.87 - 5.11 MIL/uL    Hemoglobin 10.8 (*) 12.0 - 15.0 g/dL    HCT 91.4 (*) 78.2 - 46.0 %    MCV 93.0  78.0 - 100.0 fL    MCH 30.3  26.0 - 34.0 pg    MCHC 32.5  30.0 - 36.0 g/dL    RDW 95.6  21.3 - 08.6 %    Platelets 168  150 - 400 K/uL   TROPONIN I     Status: Normal   Collection Time   03/13/12  9:07 AM      Component Value Range Comment   Troponin I <0.30  <0.30 ng/mL        Medication List     As of 03/13/2012 12:06 PM    TAKE these medications         albuterol 108 (90 BASE) MCG/ACT inhaler   Commonly known as: PROVENTIL HFA;VENTOLIN HFA   Inhale 2 puffs into the lungs  every 6 (six) hours as needed. For shortness of breath      ALPRAZolam 1 MG tablet   Commonly known as: XANAX   Take 1 mg by mouth 4 (four) times daily. For anxiety      aspirin 81 MG chewable tablet   Chew 1 tablet (81 mg total) by mouth daily.      diphenhydrAMINE 25 MG tablet   Commonly known as: BENADRYL   Take 25 mg by mouth at bedtime as needed. For allergy symptoms      gabapentin 300 MG capsule   Commonly known as: NEURONTIN   Take 300 mg by mouth at bedtime.      lisinopril-hydrochlorothiazide 10-12.5 MG per tablet   Commonly known as: PRINZIDE,ZESTORETIC   Take 2 tablets by mouth daily.      meclizine 25 MG tablet   Commonly known as: ANTIVERT   Take 25 mg by mouth daily as needed. For vertigo      methadone 10 MG tablet   Commonly known as: DOLOPHINE   Take 50 mg by mouth 4 (four) times daily - after meals and at bedtime.      multivitamin with minerals Tabs   Take 1 tablet by mouth daily.       History of present illness: 66 year old patient with chronic pain syndrome on chronic methadone followed by a pain clinic. She was found unresponsive on the kitchen floor. EMS was called  by the husband. According to the emergency department records she was found to have a pill bottle inside of her clothing with her husband's name on the label and different types of pills in the bottle. The husband also reported he found an old bottle of Soma as well. Workup in the emergency department revealed no evidence of head or neck trauma. Her EKG was stable without ischemic changes and initial troponin was negative. Urine drug screen demonstrated benzodiazepines which the patient has been formally prescribed. Patient is not hypoxic but was very unresponsive until she received Narcan and then she was able to be slowly aroused. Husband confirm that patient has not had any issues with severe depression or suicidal or homicidal ideations prior to this admission although she has been saddened  by the recent death of several family members. A chest x-ray in the emergency department demonstrated right perihilar and right infrahilar streaky airspace disease. She was admitted to the step down unit for further monitoring and treatment.  Hospital Course:  Chronic back pain  Hypertension  Encephalopathy acute  Aspiration pneumonia  Acute renal insufficiency  Encephalopathy acute due to accidental narcotic overdose Patient was admitted with presumed unintentional narcotic overdose. Since history at time of admission was not clear because patient could not answer questions appropriately as precaution the patient was placed on suicide precautions. All potentially sedating medications were held at time of admission and by the morning of discharge patient was very alert and requesting her pain medications. She endorses she follows at a pain clinic for arachnoiditis. She was able to clarify the dosage on her home medications and these were resumed just prior to discharge. Patient states she had had some insomnia on the evening she was admitted. She eventually went into the living room and slept in a recliner and when she awakened she was groggy but felt she was having significant enough pain that would require some of her usual medications. There was a pill bottle beside her on the table but she presumed was her usual methadone since she states the pills looked similar therefore she took 5 tablets as was per her usual. Unfortunately the medication was Tresa Garter which was too sedating and too much of a dose i.e. 5 pills. Patient clearly defined the following morning that she was not attempting to harm herself with the medications.  ? Aspiration pneumonia Patient had slightly abnormal findings on chest x-ray at presentation. She has remained afebrile and had no evidence of leukocytosis at presentation therefore at this time no indication to treat as an active pulmonary infection   Acute renal  insufficiency/hypertension Patient presenting with normal BUN is slightly elevated creatinine 1.32. Her usual antihypertensive medications that included thiazide diuretic were held and she was started on IV fluids. On date of discharge creatinine was 1.16. Suspect patient has mild chronic renal insufficiency related to her normal medications of lisinopril with hydrochlorothiazide.  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 She is currently being followed at the pain clinic for this and is receiving methadone. Dr. Tajuanna Seller clearly explained to the patient that because she is a client of the pain clinic he would be unable to give her any refills on her chronic pain medications. Patient and husband verbalized understanding of this concept. They stated that they did not need any refills that patient had enough medications at home to last until followup appointment with the pain physician. They did endorse that the patient  is on significant amount of methadone 600 mg in a 24-hour period and were told at last followup visit at the methadone did not appear to be working but they would give her another 2 weeks and then to transition her to another medication.  Day of Discharge BP 144/71  Pulse 72  Temp 98.7 F (37.1 C) (Oral)  Resp 15  Ht 5\' 5"  (1.651 m)  Wt 65.4 kg (144 lb 2.9 oz)  BMI 23.99 kg/m2  SpO2 100%  Physical Exam: General: Alert, in no acute distress, somewhat anxious over evolving pain since has not received usual medication Lungs: Clear to auscultation bilaterally, on room air with saturations 95% Cardiac: Heart sounds are S1-S2 without rubs murmurs rubs or gallops, no JVD and no peripheral edema, sinus rhythm Abdomen: Soft nontender nondistended bowel sounds present, no hepatosplenomegaly on exam Musculoskeletal: Extremities are symmetrical in appearance, patient has slight deformity right foot from previous  untreated foot fracture, otherwise no joint effusion or erythema Neurological: Alert and oriented x3, moves all extremities x4, exam non-focal   Follow-up: Keep her previously scheduled appointment with the pain clinic  Disposition:  Discharge to home with your husband  Junious Silk, ANP pager [phone]  I have personally examined this patient and reviewed the entire database. I have reviewed the above note, made any necessary editorial changes, and agree with its content.  Lonia Blood, MD [facility]

## 2012-03-18 LAB — CULTURE, BLOOD (ROUTINE X 2): Culture: NO GROWTH

## 2012-04-23 ENCOUNTER — Emergency Department (HOSPITAL_COMMUNITY)
Admission: EM | Admit: 2012-04-23 | Discharge: 2012-04-23 | Disposition: A | Discharge status: Home / Self Care | Payer: Medicare Other | Attending: Emergency Medicine | Admitting: Emergency Medicine

## 2012-04-23 ENCOUNTER — Emergency Department (HOSPITAL_COMMUNITY): Payer: Medicare Other

## 2012-04-23 ENCOUNTER — Encounter (HOSPITAL_COMMUNITY): Payer: Self-pay | Admitting: Emergency Medicine

## 2012-04-23 DIAGNOSIS — G40909 Epilepsy, unspecified, not intractable, without status epilepticus: Secondary | ICD-10-CM | POA: Insufficient documentation

## 2012-04-23 DIAGNOSIS — Z87891 Personal history of nicotine dependence: Secondary | ICD-10-CM | POA: Insufficient documentation

## 2012-04-23 DIAGNOSIS — F411 Generalized anxiety disorder: Secondary | ICD-10-CM | POA: Insufficient documentation

## 2012-04-23 DIAGNOSIS — Z8669 Personal history of other diseases of the nervous system and sense organs: Secondary | ICD-10-CM | POA: Insufficient documentation

## 2012-04-23 DIAGNOSIS — R5383 Other fatigue: Secondary | ICD-10-CM | POA: Insufficient documentation

## 2012-04-23 DIAGNOSIS — R259 Unspecified abnormal involuntary movements: Secondary | ICD-10-CM | POA: Insufficient documentation

## 2012-04-23 DIAGNOSIS — R51 Headache: Secondary | ICD-10-CM | POA: Insufficient documentation

## 2012-04-23 DIAGNOSIS — F19939 Other psychoactive substance use, unspecified with withdrawal, unspecified: Secondary | ICD-10-CM | POA: Insufficient documentation

## 2012-04-23 DIAGNOSIS — I10 Essential (primary) hypertension: Secondary | ICD-10-CM | POA: Insufficient documentation

## 2012-04-23 DIAGNOSIS — F13239 Sedative, hypnotic or anxiolytic dependence with withdrawal, unspecified: Secondary | ICD-10-CM

## 2012-04-23 DIAGNOSIS — R569 Unspecified convulsions: Secondary | ICD-10-CM

## 2012-04-23 DIAGNOSIS — R5381 Other malaise: Secondary | ICD-10-CM | POA: Insufficient documentation

## 2012-04-23 DIAGNOSIS — M549 Dorsalgia, unspecified: Secondary | ICD-10-CM | POA: Insufficient documentation

## 2012-04-23 DIAGNOSIS — G8929 Other chronic pain: Secondary | ICD-10-CM | POA: Insufficient documentation

## 2012-04-23 DIAGNOSIS — Z79899 Other long term (current) drug therapy: Secondary | ICD-10-CM | POA: Insufficient documentation

## 2012-04-23 LAB — URINALYSIS, ROUTINE W REFLEX MICROSCOPIC
Bilirubin Urine: NEGATIVE
Glucose, UA: NEGATIVE mg/dL
Hgb urine dipstick: NEGATIVE
Protein, ur: NEGATIVE mg/dL
Specific Gravity, Urine: 1.007 (ref 1.005–1.030)
Urobilinogen, UA: 0.2 mg/dL (ref 0.0–1.0)

## 2012-04-23 LAB — RAPID URINE DRUG SCREEN, HOSP PERFORMED
Amphetamines: NOT DETECTED
Barbiturates: NOT DETECTED
Cocaine: NOT DETECTED
Tetrahydrocannabinol: NOT DETECTED

## 2012-04-23 MED ORDER — HYDROMORPHONE HCL PF 1 MG/ML IJ SOLN
1.0000 mg | Freq: Once | INTRAMUSCULAR | Status: AC
  Administered 2012-04-23: 1 mg via INTRAVENOUS
  Filled 2012-04-23: qty 1

## 2012-04-23 MED ORDER — SODIUM CHLORIDE 0.9 % IV BOLUS (SEPSIS)
1000.0000 mL | Freq: Once | INTRAVENOUS | Status: AC
  Administered 2012-04-23: 1000 mL via INTRAVENOUS

## 2012-04-23 MED ORDER — LORAZEPAM 2 MG/ML IJ SOLN
1.0000 mg | Freq: Once | INTRAMUSCULAR | Status: AC
  Administered 2012-04-23: 1 mg via INTRAVENOUS
  Filled 2012-04-23: qty 1

## 2012-04-23 MED ORDER — ALPRAZOLAM 1 MG PO TABS: 1.0000 mg | ORAL_TABLET | Freq: Four times a day (QID) | ORAL | Status: DC

## 2012-04-23 NOTE — ED Notes (Signed)
Pt stuck x2 people for lab draw, unable to obtain blood specimens. Pt refuses to be stuck anymore. EDP [place] aware.

## 2012-04-23 NOTE — ED Provider Notes (Signed)
History     CSN: [id number]  Arrival date & time 04/23/12  1426   First MD Initiated Contact with Patient 04/23/12 1503      Chief Complaint  Patient presents with  . Altered Mental Status    HPI  The patient presents with concerns of increasingly atypical behavior.  She states that over the past week she has had multiple episodes of seizure-like activity.  These episodes consist of loud, nonsensical speech, flailing motion, loss of awareness.  Episodes occur without clear prodrome, resolved spontaneously, entirely with no postictal phase.  She states that prior to this past week she has no similar prior events.  Notably, the patient states that she has a history of arachnoiditis, anxiety.  She typically takes 50 mg methadone daily, Xanax daily.  She ran out of her medication approximately one week ago. When awake, alert, the patient is appropriately oriented, denies ongoing concerns when not symptomatic. She denies fever, chills, cough, nausea, vomiting  Past Medical History  Diagnosis Date  . Arachnoiditis   . Hypertension   . Chronic back pain     Past Surgical History  Procedure Date  . Back surgery   . Abdominal hysterectomy   . Appendectomy     No family history on file.  History  Substance Use Topics  . Smoking status: Former Smoker    Quit date: 04/09/2012  . Smokeless tobacco: Not on file  . Alcohol Use: No    OB History    Grav Para Term Preterm Abortions TAB SAB Ect Mult Living                  Review of Systems  Constitutional:       Per HPI, otherwise negative  HENT:       Per HPI, otherwise negative  Eyes: Negative.   Respiratory:       Per HPI, otherwise negative  Cardiovascular:       Per HPI, otherwise negative  Gastrointestinal: Negative for vomiting.  Genitourinary: Negative.   Musculoskeletal:       Per HPI, otherwise negative  Skin: Negative.   Neurological: Positive for tremors, seizures, weakness and headaches. Negative for  syncope and light-headedness.    Allergies  Penicillins; Sulfa antibiotics; Sertraline hcl; Other; Toradol; and Codeine  Home Medications   Current Outpatient Rx  Name  Route  Sig  Dispense  Refill  . ALBUTEROL SULFATE HFA 108 (90 BASE) MCG/ACT IN AERS   Inhalation   Inhale 2 puffs into the lungs every 6 (six) hours as needed. For shortness of breath         . ALPRAZOLAM 1 MG PO TABS   Oral   Take 1 mg by mouth 4 (four) times daily. For anxiety         . ASPIRIN 81 MG PO CHEW   Oral   Chew 1 tablet (81 mg total) by mouth daily.         Marland Kitchen DIPHENHYDRAMINE HCL 25 MG PO TABS   Oral   Take 25 mg by mouth at bedtime as needed. For allergy symptoms         . GABAPENTIN 300 MG PO CAPS   Oral   Take 300 mg by mouth at bedtime.         Marland Kitchen LISINOPRIL-HYDROCHLOROTHIAZIDE 10-12.5 MG PO TABS   Oral   Take 2 tablets by mouth daily.         Marland Kitchen METHADONE HCL 10 MG PO TABS  Oral   Take 50 mg by mouth 4 (four) times daily - after meals and at bedtime.         . ADULT MULTIVITAMIN W/MINERALS CH   Oral   Take 1 tablet by mouth daily.           BP 112/60  Pulse 67  Temp 97.4 F (36.3 C) (Oral)  Resp 11  SpO2 99%  Physical Exam  Constitutional: She is oriented to person, place, and time.       On my exam the patient is on the floor, flailing, speaking nonsensically.  This activity stopped after approximately 2 minutes.  On resolution the patient is awake, appropriately interactive, ambulatory.  HENT:  Head: Normocephalic and atraumatic.       No evidence of oropharyngeal, or head trauma  Eyes: Conjunctivae normal are normal. Right eye exhibits no discharge.  Neck: No tracheal deviation present.  Cardiovascular: Normal rate and regular rhythm.   Pulmonary/Chest: Effort normal. No stridor.  Abdominal: She exhibits no distension. There is no tenderness.  Musculoskeletal: She exhibits no edema and no tenderness.  Neurological: She is alert and oriented to person,  place, and time. No cranial nerve deficit. She exhibits normal muscle tone. Coordination normal.  Skin: Skin is warm. She is diaphoretic.  Psychiatric: Her mood appears anxious. She is is hyperactive. Thought content is delusional. Cognition and memory are impaired.    ED Course  Procedures (including critical care time)  Labs Reviewed  URINE RAPID DRUG SCREEN (HOSP PERFORMED) - Abnormal; Notable for the following:    Benzodiazepines POSITIVE (*)     All other components within normal limits  URINALYSIS, ROUTINE W REFLEX MICROSCOPIC  CBC WITH DIFFERENTIAL  COMPREHENSIVE METABOLIC PANEL  LIPASE, BLOOD  TROPONIN I  ETHANOL   No results found.   No diagnosis found.  Cardiac: 55sr, normal  O2- 99%ra, normal   Date: 04/23/2012  Rate: 52  Rhythm: normal sinus rhythm  QRS Axis: normal  Intervals: normal  ST/T Wave abnormalities: normal  Conduction Disutrbances: none  Narrative Interpretation: unremarkable    Uptake, the patient has no additional activity.  She states that she feels substantially better.  She requests discharge. We discussed the need for finding a new primary care physician to insure appropriate ongoing management of her pain, and her anxiety.  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  The patient's request for discharge, this was accommodated. MDM  This elderly appearing female with history of chronic pain presents with new atypical episodes of behavior.  Interestingly, the patient's seizure-like episodes have been the prodrome, nor any postictal phase.  Thus, there is low suspicion for true seizure activity.  However the patient does have a recent cessation of her benzodiazepine, with concern for stroke.  Throughout the patient's main ED stay she remained in no distress, appropriately interactive.  The patient's labs were largely reassuring.  I discussed the need for finding a new primary care physician, prior to discharge.  Per her  request she was discharged in stable condition to     Gerhard Munch, MD 04/23/12 1818

## 2012-04-23 NOTE — ED Notes (Signed)
Pt had pseudoseizures lasting about 35secs

## 2012-04-23 NOTE — ED Notes (Signed)
PT from home.  Pt had three episodes of decreased loc with flailing.  Each times yells "no, no, no," makes purposeful movements to grab people.

## 2012-04-23 NOTE — ED Notes (Signed)
MD Jeraldine Loots made aware of pt's pain.

## 2012-04-23 NOTE — ED Notes (Signed)
Pt states that she had 4 seizures yesterday and three seizures today. Pt states that when she starts getting a headache its precursor to seizure coming on.

## 2012-05-08 ENCOUNTER — Encounter (HOSPITAL_COMMUNITY): Payer: Self-pay | Admitting: Emergency Medicine

## 2012-05-08 ENCOUNTER — Emergency Department (INDEPENDENT_AMBULATORY_CARE_PROVIDER_SITE_OTHER)
Admission: EM | Admit: 2012-05-08 | Discharge: 2012-05-08 | Disposition: A | Discharge status: Home / Self Care | Payer: Medicare Other | Source: Home / Self Care

## 2012-05-08 DIAGNOSIS — G894 Chronic pain syndrome: Secondary | ICD-10-CM

## 2012-05-08 DIAGNOSIS — F1320 Sedative, hypnotic or anxiolytic dependence, uncomplicated: Secondary | ICD-10-CM

## 2012-05-08 MED ORDER — ALPRAZOLAM 1 MG PO TABS: 1.0000 mg | ORAL_TABLET | Freq: Every evening | ORAL | Status: DC | PRN

## 2012-05-08 NOTE — ED Notes (Signed)
[id number]<AV> Expected date:<BR> Expected time:<BR> Means of arrival:<BR> Comments:<BR> scabies

## 2012-05-08 NOTE — ED Provider Notes (Signed)
History     CSN: [id number]  Arrival date & time 05/08/12  1531   None     Chief Complaint  Patient presents with  . 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 She suffered chronic pain in her back and lower extremities. She also has a history of panic disorder and anxiety for which she has been taking Xanax 4 times a day for 10 years. She previously been following with a physician who has been prescribing Xanax on this regular basis as well as methadone 600 tablets a month. She states she had to leave the practice but calls they failed to pay the fee. They also does allow her husband to coming back to the practice because of his reviewed her behavior and cursing. She is asking for Xanax to take 4 times a day until she can find a doctor that will see her. She is not complaining any additional or new type pain or symptoms. She is requesting maintenance medications for her anxiety and pain.   Past Medical History  Diagnosis Date  . Arachnoiditis   . Hypertension   . Chronic back pain     Past Surgical History  Procedure Date  . Back surgery   . Abdominal hysterectomy   . Appendectomy     No family history on file.  History  Substance Use Topics  . Smoking status: Former Smoker    Quit date: 04/09/2012  . Smokeless tobacco: Not on file  . Alcohol Use: No    OB History    Grav Para Term Preterm Abortions TAB SAB Ect Mult Living                  Review of Systems  Constitutional: Negative.   Respiratory: Negative.   Cardiovascular: Negative.   Musculoskeletal: Positive for myalgias, back pain, arthralgias and gait problem.  Skin: Negative.   Neurological: Positive for light-headedness. Negative for tremors, syncope, speech difficulty and headaches.    Allergies  Penicillins; Sulfa  antibiotics; Sertraline hcl; Other; Toradol; and Codeine  Home Medications   Current Outpatient Rx  Name  Route  Sig  Dispense  Refill  . ALPRAZOLAM 1 MG PO TABS   Oral   Take 1 tablet (1 mg total) by mouth 4 (four) times daily. For anxiety   20 tablet   0   . GABAPENTIN 300 MG PO CAPS   Oral   Take 300 mg by mouth at bedtime.         Marland Kitchen LISINOPRIL-HYDROCHLOROTHIAZIDE 10-12.5 MG PO TABS   Oral   Take 2 tablets by mouth daily.         . ALBUTEROL SULFATE HFA 108 (90 BASE) MCG/ACT IN AERS   Inhalation   Inhale 2 puffs into the lungs every 6 (six) hours as needed. For shortness of breath         . ALPRAZOLAM 1 MG PO TABS   Oral   Take 1 tablet (1 mg total) by mouth at bedtime as needed for sleep.   10 tablet   0   . ASPIRIN 81 MG PO CHEW   Oral   Chew 1 tablet (81 mg total) by mouth daily.         Marland Kitchen DIPHENHYDRAMINE HCL 25 MG PO TABS   Oral   Take 25 mg by mouth at bedtime as needed. For allergy symptoms         .  FUROSEMIDE PO   Oral   Take 1 tablet by mouth daily.         Marland Kitchen METHADONE HCL 10 MG PO TABS   Oral   Take 50 mg by mouth 4 (four) times daily - after meals and at bedtime.         . ADULT MULTIVITAMIN W/MINERALS CH   Oral   Take 1 tablet by mouth daily.           BP 153/93  Pulse 87  Temp 98.5 F (36.9 C) (Oral)  Resp 18  SpO2 95%  Physical Exam  Constitutional: She is oriented to person, place, and time. She appears well-developed. No distress.  Eyes: EOM are normal.  Neck: Normal range of motion. Neck supple.  Cardiovascular: Normal rate, regular rhythm and normal heart sounds.   Pulmonary/Chest: Effort normal and breath sounds normal. No respiratory distress.  Neurological: She is alert and oriented to person, place, and time. No cranial nerve deficit.  Skin: Skin is warm and dry.  Psychiatric: She has a normal mood and affect.    ED Course  Procedures (including critical care time)  Labs Reviewed - No data to display No  results found.   1. Benzodiazepine dependence, continuous   2. Chronic pain syndrome       MDM  Xanax 1mg  bid prn. You must find a doctor to Rx your controlled drugs May make an appointment with the Adult Services here.           Hayden Rasmussen, NP 05/08/12 [id number]

## 2012-05-08 NOTE — ED Notes (Signed)
When discharging... Pt was not satisfied and said the provider shouldn't be practicing.

## 2012-05-08 NOTE — ED Notes (Signed)
Pt is here to have her medications refilled... Has not been able to establish w/PCP... Was seen at [facility] ED for seizure like activity and benzodiazepine withdrawal.... States she needs her xanax for anxiety or she goes into seizures per pt.

## 2012-05-08 NOTE — ED Notes (Signed)
Pt's husband very rude and was using profanity words towards me... He was asked to leave the room.

## 2012-05-10 ENCOUNTER — Encounter (HOSPITAL_COMMUNITY): Payer: Self-pay

## 2012-05-10 ENCOUNTER — Emergency Department (INDEPENDENT_AMBULATORY_CARE_PROVIDER_SITE_OTHER)
Admission: EM | Admit: 2012-05-10 | Discharge: 2012-05-10 | Disposition: A | Discharge status: Home / Self Care | Payer: Medicare Other | Source: Home / Self Care

## 2012-05-10 DIAGNOSIS — I10 Essential (primary) hypertension: Secondary | ICD-10-CM

## 2012-05-10 DIAGNOSIS — Z23 Encounter for immunization: Secondary | ICD-10-CM

## 2012-05-10 DIAGNOSIS — M549 Dorsalgia, unspecified: Secondary | ICD-10-CM

## 2012-05-10 DIAGNOSIS — G8929 Other chronic pain: Secondary | ICD-10-CM

## 2012-05-10 DIAGNOSIS — F419 Anxiety disorder, unspecified: Secondary | ICD-10-CM

## 2012-05-10 MED ORDER — METHADONE HCL 10 MG PO TABS: 20.0000 mg | ORAL_TABLET | Freq: Every day | ORAL | Status: DC

## 2012-05-10 MED ORDER — INFLUENZA VIRUS VACC SPLIT PF IM SUSP
0.5000 mL | Freq: Once | INTRAMUSCULAR | Status: AC
  Administered 2012-05-10: 0.5 mL via INTRAMUSCULAR

## 2012-05-10 MED ORDER — GABAPENTIN 300 MG PO CAPS: 300.0000 mg | ORAL_CAPSULE | Freq: Every day | ORAL | Status: DC

## 2012-05-10 MED ORDER — ALPRAZOLAM 1 MG PO TABS: 1.0000 mg | ORAL_TABLET | Freq: Four times a day (QID) | ORAL | Status: DC | PRN

## 2012-05-10 MED ORDER — LISINOPRIL-HYDROCHLOROTHIAZIDE 10-12.5 MG PO TABS: 1.0000 | ORAL_TABLET | Freq: Every day | ORAL | Status: DC

## 2012-05-10 NOTE — ED Notes (Signed)
Medication refill

## 2012-05-10 NOTE — ED Provider Notes (Signed)
History    CSN: [id number]  Arrival date & time 05/10/12  1700  Chief Complaint  Patient presents with  . Medication Refill   HPI  Patient presents today as a new patient to the clinic because she's been having difficulty with getting a primary care provider after losing her primary care physician.  The difficulty has, from the fact that she has been on long-term chronic opioid medications with methadone which she is taking high doses and has been taking it for many many years.  She does have chronic back pain and other chronic pain issues.  She has been seen by neurology.  She takes 600  10 mg tablets of methadone per month which has been verified through the [facility] drug registry that I was able to interrogate.  Her last prescription was given on March 29, 2012.  The patient says she is scheduled to be established with a pain management specialist in the next 10 days.  She would like to have her medications refilled until she can get established.  She reports that her medical records are being sent to the pain management specialist.  She is also planning to establish primary care with a private physician in the near future.  The patient is requesting a referral to urologist and the nephrologist.  She was recently hospitalized at that time had a chronic renal insufficiency and is now having difficulty with urinary continence.  She also reports that she's having some seizures thought to be related to withdrawal from some of the medications that she has been taking chronically.  Past Medical History  Diagnosis Date  . Arachnoiditis   . Hypertension   . Chronic back pain     Past Surgical History  Procedure Date  . Back surgery   . Abdominal hysterectomy   . Appendectomy     No family history on file.  History  Substance Use Topics  . Smoking status: Former Smoker    Quit date: 04/09/2012  . Smokeless tobacco: Not on file  . Alcohol Use: No   OB History    Grav Para Term  Preterm Abortions TAB SAB Ect Mult Living                 Review of Systems  Constitutional: Negative.   Eyes: Negative.   Respiratory: Negative.   Cardiovascular: Negative.   Gastrointestinal: Negative.   Genitourinary: Positive for enuresis.  Musculoskeletal: Positive for back pain, arthralgias and gait problem.  Neurological: Negative.   Psychiatric/Behavioral: Negative.    Allergies  Penicillins; Sulfa antibiotics; Sertraline hcl; Other; Toradol; and Codeine  Home Medications   Current Outpatient Rx  Name  Route  Sig  Dispense  Refill  . ALBUTEROL SULFATE HFA 108 (90 BASE) MCG/ACT IN AERS   Inhalation   Inhale 2 puffs into the lungs every 6 (six) hours as needed. For shortness of breath         . ALPRAZOLAM 1 MG PO TABS   Oral   Take 1 tablet (1 mg total) by mouth 4 (four) times daily. For anxiety   20 tablet   0   . ALPRAZOLAM 1 MG PO TABS   Oral   Take 1 tablet (1 mg total) by mouth at bedtime as needed for sleep.   10 tablet   0   . ASPIRIN 81 MG PO CHEW   Oral   Chew 1 tablet (81 mg total) by mouth daily.         Marland Kitchen  DIPHENHYDRAMINE HCL 25 MG PO TABS   Oral   Take 25 mg by mouth at bedtime as needed. For allergy symptoms         . FUROSEMIDE PO   Oral   Take 1 tablet by mouth daily.         Marland Kitchen GABAPENTIN 300 MG PO CAPS   Oral   Take 300 mg by mouth at bedtime.         Marland Kitchen LISINOPRIL-HYDROCHLOROTHIAZIDE 10-12.5 MG PO TABS   Oral   Take 2 tablets by mouth daily.         Marland Kitchen METHADONE HCL 10 MG PO TABS   Oral   Take 50 mg by mouth 4 (four) times daily - after meals and at bedtime.         . ADULT MULTIVITAMIN W/MINERALS CH   Oral   Take 1 tablet by mouth daily.           BP 157/82  Pulse 67  Temp 97.7 F (36.5 C) (Oral)  Resp 19  SpO2 100%  Physical Exam  Nursing note and vitals reviewed. Constitutional: She is oriented to person, place, and time. She appears well-developed. No distress.       Cachectic  HENT:  Head:  Normocephalic and atraumatic.  Eyes: EOM are normal. Pupils are equal, round, and reactive to light.  Neck: Normal range of motion. Neck supple.  Cardiovascular: Normal rate, regular rhythm and normal heart sounds.   Pulmonary/Chest: Effort normal and breath sounds normal.  Abdominal: Soft. Bowel sounds are normal.  Neurological: She is alert and oriented to person, place, and time. Coordination normal.  Skin: Skin is warm and dry. No erythema.  Psychiatric: She has a normal mood and affect. Thought content normal.    ED Course  Procedures (including critical care time)  Labs Reviewed - No data to display No results found.  No diagnosis found.  MDM  IMPRESSION  Chronic opioid dependence  Hypertension  Chronic benzodiazepine dependence  Chronic pain syndrome  Peripheral neuropathy  RECOMMENDATIONS / PLAN Flu Vaccine given The pt is establishing care with the pain management specialist in 2 weeks.  I will refill her methadone for 2 weeks until she can get established at her new pain management center.  I did interrogate her on the [place] drug database and it confirmed what she had been telling me about how she was taking her medications.  I refilled her alprazolam and refilled her lisinopril HCTZ medications.   Pt to increase the gabapentin to 900 mg po QHS and 300 mg at lunchtime daily.   Pt says that she is going to establish primary care with a private physician in the next several weeks as she has Medicare Medicaid.    FOLLOW UP: Pt says that she is going to establish primary care with a private physician in the next several weeks as she has Medicare Medicaid.    The patient was given clear instructions to go to ER or return to medical center if symptoms don't improve, worsen or new problems develop.  The patient verbalized understanding.  The patient was told to call to get lab results if they haven't heard anything in the next week.            Cleora Fleet,  MD 05/10/12 [phone]

## 2012-05-12 NOTE — ED Provider Notes (Signed)
Medical screening examination/treatment/procedure(s) were performed by resident physician or non-physician practitioner and as supervising physician I was immediately available for consultation/collaboration.   Shady Padron DOUGLAS MD.    Odessa Morren D Mario Coronado, MD 05/12/12 1122 

## 2012-05-24 DIAGNOSIS — D1771 Benign lipomatous neoplasm of kidney: Secondary | ICD-10-CM

## 2012-05-24 HISTORY — DX: Benign lipomatous neoplasm of kidney: D17.71

## 2012-05-31 ENCOUNTER — Emergency Department (INDEPENDENT_AMBULATORY_CARE_PROVIDER_SITE_OTHER): Payer: Medicare Other

## 2012-05-31 ENCOUNTER — Emergency Department (INDEPENDENT_AMBULATORY_CARE_PROVIDER_SITE_OTHER)
Admission: EM | Admit: 2012-05-31 | Discharge: 2012-05-31 | Disposition: A | Discharge status: Home / Self Care | Payer: Medicare Other | Source: Home / Self Care

## 2012-05-31 ENCOUNTER — Encounter (HOSPITAL_COMMUNITY): Payer: Self-pay

## 2012-05-31 DIAGNOSIS — I10 Essential (primary) hypertension: Secondary | ICD-10-CM

## 2012-05-31 DIAGNOSIS — J209 Acute bronchitis, unspecified: Secondary | ICD-10-CM

## 2012-05-31 DIAGNOSIS — M549 Dorsalgia, unspecified: Secondary | ICD-10-CM

## 2012-05-31 DIAGNOSIS — G8929 Other chronic pain: Secondary | ICD-10-CM

## 2012-05-31 MED ORDER — DEXTROMETHORPHAN POLISTIREX 30 MG/5ML PO LQCR: 60.0000 mg | ORAL | Status: DC | PRN

## 2012-05-31 MED ORDER — ALPRAZOLAM 1 MG PO TABS: 1.0000 mg | ORAL_TABLET | Freq: Four times a day (QID) | ORAL | Status: DC | PRN

## 2012-05-31 MED ORDER — ALBUTEROL SULFATE (5 MG/ML) 0.5% IN NEBU
INHALATION_SOLUTION | RESPIRATORY_TRACT | Status: AC
  Filled 2012-05-31: qty 0.5

## 2012-05-31 MED ORDER — ALBUTEROL SULFATE HFA 108 (90 BASE) MCG/ACT IN AERS: 2.0000 | INHALATION_SPRAY | Freq: Four times a day (QID) | RESPIRATORY_TRACT | Status: AC | PRN

## 2012-05-31 MED ORDER — IPRATROPIUM BROMIDE 0.02 % IN SOLN
0.5000 mg | Freq: Once | RESPIRATORY_TRACT | Status: AC
  Administered 2012-05-31: 0.5 mg via RESPIRATORY_TRACT

## 2012-05-31 MED ORDER — DOXYCYCLINE HYCLATE 100 MG PO CAPS: 100.0000 mg | ORAL_CAPSULE | Freq: Two times a day (BID) | ORAL | Status: DC

## 2012-05-31 MED ORDER — ALBUTEROL SULFATE (5 MG/ML) 0.5% IN NEBU
2.5000 mg | INHALATION_SOLUTION | Freq: Once | RESPIRATORY_TRACT | Status: AC
  Administered 2012-05-31: 2.5 mg via RESPIRATORY_TRACT

## 2012-05-31 MED ORDER — METHADONE HCL 10 MG PO TABS: ORAL_TABLET | ORAL | Status: DC

## 2012-05-31 NOTE — ED Notes (Signed)
Complain of cough, congestion pain in chest area for almost a week

## 2012-05-31 NOTE — ED Provider Notes (Signed)
History     CSN: [id number]  Arrival date & time 05/31/12  1523  Chief Complaint  Patient presents with  . URI   HPI Pt have been having 1 week of cough and congestion, chest congestion, malaise and concerned that she may have pneumonia.  The patient has had pneumonia in the past.   The patient still working on getting established with the pain management clinic.  She says that she may have an appointment set up in the next week for primary care with Haivana Nakya.  She's going to be establishing with him on January 15.  I given her a two-week supply of her pain medication a couple weeks ago.  She is requesting another two-week extension until she can get established with a qualified pain management specialist.  She has a complicated pain management regimen and history.  The patient denies fever and chills.  She has had some sick contacts with her husband who is been sick with an upper respiratory illness.  Past Medical History  Diagnosis Date  . Arachnoiditis   . Hypertension   . Chronic back pain     Past Surgical History  Procedure Date  . Back surgery   . Abdominal hysterectomy   . Appendectomy     No family history on file.  History  Substance Use Topics  . Smoking status: Former Smoker    Quit date: 04/09/2012  . Smokeless tobacco: Not on file  . Alcohol Use: No    OB History    Grav Para Term Preterm Abortions TAB SAB Ect Mult Living                 Review of Systems  Respiratory: Positive for cough, shortness of breath and wheezing.   Hematological: Negative.   Psychiatric/Behavioral: Negative.   All other systems reviewed and are negative.    Allergies  Penicillins; Sulfa antibiotics; Sertraline hcl; Other; Toradol; and Codeine  Home Medications   Current Outpatient Rx  Name  Route  Sig  Dispense  Refill  . ALPRAZOLAM 1 MG PO TABS   Oral   Take 1 tablet (1 mg total) by mouth 4 (four) times daily as needed for sleep or anxiety. For anxiety   56 tablet    0   . ASPIRIN 81 MG PO CHEW   Oral   Chew 1 tablet (81 mg total) by mouth daily.         Marland Kitchen GABAPENTIN 300 MG PO CAPS   Oral   Take 1 capsule (300 mg total) by mouth at bedtime. Take 3 caps po QHS and take 1 cap at lunchtime   120 capsule   2   . LISINOPRIL-HYDROCHLOROTHIAZIDE 10-12.5 MG PO TABS   Oral   Take 1 tablet by mouth daily.   30 tablet   3   . METHADONE HCL 10 MG PO TABS   Oral   Take 2 tablets (20 mg total) by mouth daily. Take twenty - 10 mg tabs daily as directed   280 tablet   0   . ADULT MULTIVITAMIN W/MINERALS CH   Oral   Take 1 tablet by mouth daily.           BP 143/70  Pulse 59  Temp 97.5 F (36.4 C) (Oral)  Resp 18  SpO2 98%  Physical Exam  Nursing note and vitals reviewed. Constitutional: She is oriented to person, place, and time. She appears well-developed and well-nourished. No distress.  HENT:  Head: Normocephalic and  atraumatic.  Neck: Normal range of motion. Neck supple.  Cardiovascular: Normal rate, regular rhythm and normal heart sounds.   Pulmonary/Chest: She has wheezes. She has rales.  Neurological: She is alert and oriented to person, place, and time.  Skin: Skin is warm and dry.    ED Course  Procedures (including critical care time)  Labs Reviewed - No data to display No results found. No diagnosis found.  MDM  IMPRESSION  Acute Bronchitis  Hypertension  Chronic Pain   RECOMMENDATIONS / PLAN CXR negative for infection or acute findings Patient improved after the nebulizer treatment.  She had much less wheezing and chest congestion. Doxycycline 100 mg by mouth twice a day Albuterol inhaler use as needed for wheezing cough Delsym cough syrup 1 teaspoon every 12 hours as needed for cough The patient is going to be following up with [place] [facility] for primary care to be established as scheduled for January 15.  She also will establish care with her new pain management clinic.  I explained to the patient that  I'm not comfortable managing her chronic pain issues because of her being on high-dose methadone and been on alprazolam.  I did verify that she had been prescribed this medication for a very long time by her former PCP at [facility].  I explained to the patient that I would be willing to give her a two-week extension with her pain management medications until she can get established but no further pain management will be provided through this clinic.  She verbalized understanding.  FOLLOW UP With her new primary care physician and pain management specialist if she has already arranged.  The patient was given clear instructions to go to ER or return to medical center if symptoms don't improve, worsen or new problems develop.  The patient verbalized understanding.  The patient was told to call to get lab results if they haven't heard anything in the next week.            Cleora Fleet, MD 05/31/12 [phone]

## 2012-06-08 ENCOUNTER — Ambulatory Visit: Payer: Medicare Other | Admitting: Family Medicine

## 2012-06-08 DIAGNOSIS — Z0289 Encounter for other administrative examinations: Secondary | ICD-10-CM

## 2012-06-27 ENCOUNTER — Emergency Department (INDEPENDENT_AMBULATORY_CARE_PROVIDER_SITE_OTHER)
Admission: EM | Admit: 2012-06-27 | Discharge: 2012-06-27 | Disposition: A | Discharge status: Home / Self Care | Payer: Medicare Other | Source: Home / Self Care | Attending: Family Medicine | Admitting: Family Medicine

## 2012-06-27 ENCOUNTER — Encounter (HOSPITAL_COMMUNITY): Payer: Self-pay

## 2012-06-27 DIAGNOSIS — M549 Dorsalgia, unspecified: Secondary | ICD-10-CM

## 2012-06-27 DIAGNOSIS — G8929 Other chronic pain: Secondary | ICD-10-CM

## 2012-06-27 DIAGNOSIS — I10 Essential (primary) hypertension: Secondary | ICD-10-CM

## 2012-06-27 MED ORDER — ALPRAZOLAM 1 MG PO TABS: 1.0000 mg | ORAL_TABLET | Freq: Four times a day (QID) | ORAL | Status: DC | PRN

## 2012-06-27 MED ORDER — METHADONE HCL 10 MG PO TABS: ORAL_TABLET | ORAL | Status: DC

## 2012-06-27 NOTE — ED Notes (Signed)
Follow up- medication refill 

## 2012-06-27 NOTE — ED Provider Notes (Signed)
History     CSN: [id number]  Arrival date & time 06/27/12  1240   First MD Initiated Contact with Patient 06/27/12 1325     Chief Complaint  Patient presents with  . Medication Refill   HPI The patient is presenting today for refills of her chronic pain medications anxiety medication.  The patient reports that she has made an appointment to be established with a primary care physician but will not be seen until April.  She is currently trying to get into a pain management clinic.  She reports that she is going through withdrawal because she hasn't been able to return to have her prescription filled because she was sick with pneumonia.  The patient is shaking at times and has been unsteady on her feet at times going through withdrawal.  Past Medical History  Diagnosis Date  . Arachnoiditis   . Hypertension   . Chronic back pain     Past Surgical History  Procedure Date  . Back surgery   . Abdominal hysterectomy   . Appendectomy     No family history on file.  History  Substance Use Topics  . Smoking status: Former Smoker    Quit date: 04/09/2012  . Smokeless tobacco: Not on file  . Alcohol Use: No    OB History    Grav Para Term Preterm Abortions TAB SAB Ect Mult Living                 Review of Systems  Musculoskeletal: Positive for back pain and arthralgias.  All other systems reviewed and are negative.   Allergies  Penicillins; Sulfa antibiotics; Sertraline hcl; Other; Toradol; and Codeine  Home Medications   Current Outpatient Rx  Name  Route  Sig  Dispense  Refill  . ALBUTEROL SULFATE HFA 108 (90 BASE) MCG/ACT IN AERS   Inhalation   Inhale 2 puffs into the lungs every 6 (six) hours as needed for wheezing.   1 Inhaler   2   . ALPRAZOLAM 1 MG PO TABS   Oral   Take 1 tablet (1 mg total) by mouth 4 (four) times daily as needed for anxiety. For anxiety   56 tablet   0   . ASPIRIN 81 MG PO CHEW   Oral   Chew 1 tablet (81 mg total) by mouth daily.          Marland Kitchen DEXTROMETHORPHAN POLISTIREX ER 30 MG/5ML PO LQCR   Oral   Take 10 mLs (60 mg total) by mouth as needed for cough.   89 mL   0   . DOXYCYCLINE HYCLATE 100 MG PO CAPS   Oral   Take 1 capsule (100 mg total) by mouth 2 (two) times daily.   20 capsule   0   . GABAPENTIN 300 MG PO CAPS   Oral   Take 1 capsule (300 mg total) by mouth at bedtime. Take 3 caps po QHS and take 1 cap at lunchtime   120 capsule   2   . LISINOPRIL-HYDROCHLOROTHIAZIDE 10-12.5 MG PO TABS   Oral   Take 1 tablet by mouth daily.   30 tablet   3   . METHADONE HCL 10 MG PO TABS   Oral   Take 2 tablets (20 mg total) by mouth daily. Take twenty - 10 mg tabs daily as directed   280 tablet   0   . METHADONE HCL 10 MG PO TABS      Take twenty - 10  mg tabs daily as directed   280 tablet   0   . ADULT MULTIVITAMIN W/MINERALS CH   Oral   Take 1 tablet by mouth daily.           BP 148/86  Pulse 88  Temp 97.6 F (36.4 C) (Oral)  SpO2 95%  Physical Exam  Nursing note and vitals reviewed. Constitutional: She is oriented to person, place, and time. She appears well-developed and well-nourished. No distress.  HENT:  Head: Normocephalic and atraumatic.  Eyes: Conjunctivae normal and EOM are normal. Pupils are equal, round, and reactive to light.  Neck: Normal range of motion. Neck supple.  Cardiovascular: Normal rate, regular rhythm and normal heart sounds.   Pulmonary/Chest: Effort normal.  Abdominal: Soft. Bowel sounds are normal.  Neurological: She is alert and oriented to person, place, and time. Coordination abnormal.       Patient very unsteady on her feet and has a resting tremor  Skin: Skin is warm and dry.  Psychiatric: She has a normal mood and affect. Judgment and thought content normal.    ED Course  Procedures (including critical care time)  Labs Reviewed - No data to display No results found.  No diagnosis found.  MDM  IMPRESSION  Chronic opioid dependence  Chronic  anxiety disorder  Acute Withdrawal from opioid and benzodiazepine  RECOMMENDATIONS / PLAN I did refill her chronic pain and anxiety medications today.  She has been taking this regimen for many years.  Because she had started going through withdrawal symptoms today I strongly recommended that she go to the emergency department for evaluation.  The patient refused to do so.  We monitored her in the office for an hour, she remained stable and her vitals remained stable, and her husband came and picked her up and took her to get her medications refilled.  I explained to her that she should go to the emergency department if her symptoms don't improve.  She is scheduled to followup with her primary care physician in April.  She is also trying to get into a pain management clinic and is on a waiting list for that as well.  FOLLOW UP 1 month  The patient was given clear instructions to go to ER or return to medical center if symptoms don't improve, worsen or new problems develop.  The patient verbalized understanding.  The patient was told to call to get lab results if they haven't heard anything in the next week.          Cleora Fleet, MD 06/27/12 2053

## 2012-07-01 NOTE — Progress Notes (Signed)
I spoke with [name] on the telephone at [facility].  He informed me that the patient was a No-Show No-Call for her scheduled appointment on 1/16.  The patient is now scheduled to be seen in April.    Maryln Manuel, MD

## 2012-07-15 NOTE — Progress Notes (Signed)
Pt presented with her husband to the front desk asking for the doctor to write her a prescription for her pain and anxiety medications.  It has not been 30 days since she was seen here on 06/27/12 and received her med refills at that time.  She has made an appointment to establish with a primary care practice at [facility].  I had explained to her at that time that she needed to follow up with her primary practice and pain management practice.  She was not interested in being tapered off her medications.   I explained that this clinic is not equipped to handle chronic pain management and that we see patients that have limited resources and no insurance and no primary care provider.  She has insurance and prescription benefits and a primary care provider.  She was advised to follow up and discuss her pain management further with her own PCP.  I also provided telephone number and directions for [facility] if patient chooses to pursue detox.    Rodney Langton, MD, CDE, FAAFP [facility] [place], [place]

## 2012-07-22 DIAGNOSIS — D352 Benign neoplasm of pituitary gland: Secondary | ICD-10-CM

## 2012-07-22 HISTORY — DX: Benign neoplasm of pituitary gland: D35.2

## 2012-07-25 ENCOUNTER — Encounter: Payer: Self-pay | Admitting: Family Medicine

## 2012-07-25 ENCOUNTER — Ambulatory Visit (INDEPENDENT_AMBULATORY_CARE_PROVIDER_SITE_OTHER): Payer: Medicare Other | Admitting: Family Medicine

## 2012-07-25 VITALS — BP 140/88 | HR 84 | Temp 98.4°F | Ht 63.5 in | Wt 130.8 lb

## 2012-07-25 DIAGNOSIS — G8929 Other chronic pain: Secondary | ICD-10-CM

## 2012-07-25 MED ORDER — ALPRAZOLAM 1 MG PO TABS: 2.0000 mg | ORAL_TABLET | Freq: Four times a day (QID) | ORAL | Status: DC | PRN

## 2012-07-25 MED ORDER — GABAPENTIN 300 MG PO CAPS: 1200.0000 mg | ORAL_CAPSULE | Freq: Every day | ORAL | Status: DC

## 2012-07-25 MED ORDER — METHADONE HCL 10 MG PO TABS: 50.0000 mg | ORAL_TABLET | Freq: Four times a day (QID) | ORAL | Status: DC | PRN

## 2012-07-25 NOTE — Patient Instructions (Addendum)
We need to get you set up with pain management to prescribe the methadone as I don't prescribe this medicine. I've refilled methadone for now but if you want to continue taking methadone you will need to see pain management, otherwise we will taper you off this medicine. Return to see me in 3 months for follow up. Pass by Marion's office for referral for pain management.

## 2012-07-25 NOTE — Progress Notes (Signed)
Subjective:    Patient ID: [name] [name], female    DOB: March 08, 1946, 67 y.o.   MRN: [id number]  HPI CC: new pt to establish  Prior saw Dr. Foy Guadalajara with [facility].  Discharged from his practice 2/2 unable to pay.  Has seen Dr. Dutch Quint of neurosurgery in past.  H/o chronic back pain and arachnoiditis on long term methadone.  Multiple spine surgeries in past.  On disability for back.  Endorses significant paresthesias.  Takes methadone five 10 mg pills four times daily regularly, states last prescribed full month supply (#600) by Dr. Laural Benes at [facility] but records show she only received #280 pills. Significant anxiety - Worsened after sister died October 29, 2011.  States takes 1mg  two pills four times daily regularly.  Last given # 280 tablets of methadone 10mg  and #56 tablets of alprazolam 1mg  on 06/27/2012 by urgent care.  Husband with h/o Hep C, pt has never tested positive.  Last blood work was 6 months ago. Doesn't get along with brother who lives in [place].  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  Past histories reviewed and updated if relevant as below. Patient Active Problem List  Diagnosis  . Chronic back pain  . Hypertension  . Encephalopathy acute  . Aspiration pneumonia  . Acute renal insufficiency   Past Medical History  Diagnosis Date  . Arachnoiditis   . Hypertension   . Chronic back pain   . Arthritis   . Syncopal episodes   . Heart murmur   . Chronic bronchitis   . HLD (hyperlipidemia)   . Colon polyps   . H/O scarlet fever   . H/O: CVA (cerebrovascular accident)     TIA  . Urine incontinence   . Anxiety     severe with panic attacks   Past Surgical  History  Procedure Laterality Date  . Laminectomy  5621,3086    x 4  . Abdominal hysterectomy  1985  . Appendectomy  10/28/1961  . Breast biopsy  1966  . Cardiac catheterization  October 29, 2002  . Lumbar fusion  2002/10/29   History  Substance Use Topics  . Smoking status: Current Every Day Smoker -- 0.25 packs/day    Types: Cigarettes  . Smokeless tobacco: Never Used  . Alcohol Use: No   Family History  Problem Relation Age of Onset  . Arthritis    . CAD Mother     MI  . CAD Father     MI s/p 3v CABG  . Kidney failure Father     ESRD on HD  . Stroke Mother   . Cancer Neg Hx   . Diabetes Neg Hx    Allergies  Allergen Reactions  . Penicillins Anaphylaxis  . Sulfa Antibiotics Anaphylaxis  . Sertraline Hcl Other (See Comments)    Caused pt to be hyper and confussed  . Other     ALL ANTI DEPRESSANTS; MUSHROOMS  . Toradol (Ketorolac Tromethamine) Nausea And Vomiting  . Versed (Midazolam)     DID NOT WORK  . Codeine Itching and Rash   Current Outpatient Prescriptions on File Prior to Visit  Medication Sig Dispense Refill  . albuterol (PROVENTIL HFA;VENTOLIN HFA) 108 (90  BASE) MCG/ACT inhaler Inhale 2 puffs into the lungs every 6 (six) hours as needed for wheezing.  1 Inhaler  2  . aspirin 81 MG chewable tablet Chew 1 tablet (81 mg total) by mouth daily.      Marland Kitchen lisinopril-hydrochlorothiazide (PRINZIDE,ZESTORETIC) 10-12.5 MG per tablet Take 1 tablet by mouth daily.  30 tablet  3  . methadone (DOLOPHINE) 10 MG tablet Take twenty - 10 mg tabs daily as directed  280 tablet  0  . Multiple Vitamin (MULTIVITAMIN WITH MINERALS) TABS Take 1 tablet by mouth daily.      . [DISCONTINUED] diphenhydrAMINE (BENADRYL) 25 MG tablet Take 25 mg by mouth at bedtime as needed. For allergy symptoms      . [DISCONTINUED] FUROSEMIDE PO Take 1 tablet by mouth daily.       No current facility-administered medications on file prior to visit.     Review of Systems  Constitutional: Positive for fever (recent flu) and  unexpected weight change (lost 18 lbs in last 2 months). Negative for chills, activity change, appetite change and fatigue.  HENT: Negative for hearing loss and neck pain.   Eyes: Positive for visual disturbance.  Respiratory: Positive for cough, chest tightness and wheezing. Negative for shortness of breath.   Cardiovascular: Negative for chest pain, palpitations and leg swelling.  Gastrointestinal: Positive for diarrhea. Negative for nausea, vomiting, abdominal pain, constipation, blood in stool and abdominal distention.  Genitourinary: Negative for hematuria and difficulty urinating.  Musculoskeletal: Positive for back pain, arthralgias and gait problem. Negative for myalgias.  Skin: Negative for rash.  Neurological: Positive for seizures (hx per pt) and syncope. Negative for dizziness and headaches.  Hematological: Bruises/bleeds easily.  Psychiatric/Behavioral: Positive for dysphoric mood. The patient is nervous/anxious.        Objective:   Physical Exam  Nursing note and vitals reviewed. Constitutional: She is oriented to person, place, and time. She appears well-developed and well-nourished. No distress.  Needs assistance to get on exam table.  HENT:  Head: Normocephalic and atraumatic.  Right Ear: Hearing, tympanic membrane, external ear and ear canal normal.  Left Ear: Hearing, tympanic membrane, external ear and ear canal normal.  Nose: Nose normal.  Mouth/Throat: Oropharynx is clear and moist. No oropharyngeal exudate.  Eyes: Conjunctivae and EOM are normal. Pupils are equal, round, and reactive to light. No scleral icterus.  Neck: Normal range of motion. Neck supple. No thyromegaly present.  Cardiovascular: Normal rate, regular rhythm, normal heart sounds and intact distal pulses.   No murmur heard. Pulses:      Radial pulses are 2+ on the right side, and 2+ on the left side.  Pulmonary/Chest: Effort normal and breath sounds normal. No respiratory distress. She has no  wheezes. She has no rales.  Abdominal: Soft. Bowel sounds are normal. She exhibits no distension and no mass. There is no tenderness. There is no rebound and no guarding.  Musculoskeletal: Normal range of motion. She exhibits no edema.  Lymphadenopathy:    She has no cervical adenopathy.  Neurological: She is alert and oriented to person, place, and time.  CN grossly intact, station and gait intact  Skin: Skin is warm and dry. No rash noted.  Psychiatric: She has a normal mood and affect. Her behavior is normal. Judgment and thought content normal.       Assessment & Plan:

## 2012-07-26 ENCOUNTER — Emergency Department (HOSPITAL_COMMUNITY): Payer: Medicare Other

## 2012-07-26 ENCOUNTER — Encounter: Payer: Self-pay | Admitting: Family Medicine

## 2012-07-26 ENCOUNTER — Encounter (HOSPITAL_COMMUNITY): Payer: Self-pay | Admitting: *Deleted

## 2012-07-26 ENCOUNTER — Telehealth: Payer: Self-pay | Admitting: Family Medicine

## 2012-07-26 ENCOUNTER — Inpatient Hospital Stay (HOSPITAL_COMMUNITY)
Admission: EM | Admit: 2012-07-26 | Discharge: 2012-08-02 | DRG: 682 | Disposition: A | Discharge status: Home / Self Care | Payer: Medicare Other | Attending: Internal Medicine | Admitting: Internal Medicine

## 2012-07-26 DIAGNOSIS — I10 Essential (primary) hypertension: Secondary | ICD-10-CM | POA: Diagnosis present

## 2012-07-26 DIAGNOSIS — Z79899 Other long term (current) drug therapy: Secondary | ICD-10-CM

## 2012-07-26 DIAGNOSIS — E2740 Unspecified adrenocortical insufficiency: Secondary | ICD-10-CM

## 2012-07-26 DIAGNOSIS — R680 Hypothermia, not associated with low environmental temperature: Secondary | ICD-10-CM | POA: Diagnosis present

## 2012-07-26 DIAGNOSIS — F419 Anxiety disorder, unspecified: Secondary | ICD-10-CM | POA: Diagnosis present

## 2012-07-26 DIAGNOSIS — I498 Other specified cardiac arrhythmias: Secondary | ICD-10-CM | POA: Diagnosis present

## 2012-07-26 DIAGNOSIS — B952 Enterococcus as the cause of diseases classified elsewhere: Secondary | ICD-10-CM | POA: Diagnosis present

## 2012-07-26 DIAGNOSIS — Z880 Allergy status to penicillin: Secondary | ICD-10-CM

## 2012-07-26 DIAGNOSIS — N390 Urinary tract infection, site not specified: Secondary | ICD-10-CM

## 2012-07-26 DIAGNOSIS — G9349 Other encephalopathy: Secondary | ICD-10-CM | POA: Diagnosis present

## 2012-07-26 DIAGNOSIS — M549 Dorsalgia, unspecified: Secondary | ICD-10-CM | POA: Diagnosis present

## 2012-07-26 DIAGNOSIS — E876 Hypokalemia: Secondary | ICD-10-CM | POA: Diagnosis present

## 2012-07-26 DIAGNOSIS — Z981 Arthrodesis status: Secondary | ICD-10-CM

## 2012-07-26 DIAGNOSIS — R579 Shock, unspecified: Secondary | ICD-10-CM | POA: Diagnosis present

## 2012-07-26 DIAGNOSIS — Z8673 Personal history of transient ischemic attack (TIA), and cerebral infarction without residual deficits: Secondary | ICD-10-CM

## 2012-07-26 DIAGNOSIS — G894 Chronic pain syndrome: Secondary | ICD-10-CM | POA: Diagnosis present

## 2012-07-26 DIAGNOSIS — Z882 Allergy status to sulfonamides status: Secondary | ICD-10-CM

## 2012-07-26 DIAGNOSIS — G9340 Encephalopathy, unspecified: Secondary | ICD-10-CM

## 2012-07-26 DIAGNOSIS — J449 Chronic obstructive pulmonary disease, unspecified: Secondary | ICD-10-CM | POA: Diagnosis present

## 2012-07-26 DIAGNOSIS — Z9181 History of falling: Secondary | ICD-10-CM

## 2012-07-26 DIAGNOSIS — Z8249 Family history of ischemic heart disease and other diseases of the circulatory system: Secondary | ICD-10-CM

## 2012-07-26 DIAGNOSIS — E2749 Other adrenocortical insufficiency: Secondary | ICD-10-CM | POA: Diagnosis present

## 2012-07-26 DIAGNOSIS — Z888 Allergy status to other drugs, medicaments and biological substances status: Secondary | ICD-10-CM

## 2012-07-26 DIAGNOSIS — R001 Bradycardia, unspecified: Secondary | ICD-10-CM

## 2012-07-26 DIAGNOSIS — E785 Hyperlipidemia, unspecified: Secondary | ICD-10-CM | POA: Diagnosis present

## 2012-07-26 DIAGNOSIS — F411 Generalized anxiety disorder: Secondary | ICD-10-CM | POA: Diagnosis present

## 2012-07-26 DIAGNOSIS — F17200 Nicotine dependence, unspecified, uncomplicated: Secondary | ICD-10-CM | POA: Diagnosis present

## 2012-07-26 DIAGNOSIS — Z823 Family history of stroke: Secondary | ICD-10-CM

## 2012-07-26 DIAGNOSIS — G8929 Other chronic pain: Secondary | ICD-10-CM | POA: Diagnosis present

## 2012-07-26 DIAGNOSIS — I959 Hypotension, unspecified: Secondary | ICD-10-CM | POA: Diagnosis present

## 2012-07-26 DIAGNOSIS — D6489 Other specified anemias: Secondary | ICD-10-CM | POA: Diagnosis present

## 2012-07-26 DIAGNOSIS — N179 Acute kidney failure, unspecified: Principal | ICD-10-CM | POA: Diagnosis present

## 2012-07-26 DIAGNOSIS — J4489 Other specified chronic obstructive pulmonary disease: Secondary | ICD-10-CM | POA: Diagnosis present

## 2012-07-26 DIAGNOSIS — N289 Disorder of kidney and ureter, unspecified: Secondary | ICD-10-CM | POA: Diagnosis present

## 2012-07-26 DIAGNOSIS — Z7982 Long term (current) use of aspirin: Secondary | ICD-10-CM

## 2012-07-26 LAB — CBC
HCT: 32.4 % — ABNORMAL LOW (ref 36.0–46.0)
MCV: 92.6 fL (ref 78.0–100.0)
Platelets: 167 10*3/uL (ref 150–400)
RBC: 3.5 MIL/uL — ABNORMAL LOW (ref 3.87–5.11)
WBC: 6 10*3/uL (ref 4.0–10.5)

## 2012-07-26 LAB — POCT I-STAT, CHEM 8
BUN: 36 mg/dL — ABNORMAL HIGH (ref 6–23)
Chloride: 110 mEq/L (ref 96–112)
Creatinine, Ser: 2.2 mg/dL — ABNORMAL HIGH (ref 0.50–1.10)
Sodium: 142 mEq/L (ref 135–145)
TCO2: 25 mmol/L (ref 0–100)

## 2012-07-26 LAB — POCT I-STAT TROPONIN I

## 2012-07-26 MED ORDER — SODIUM CHLORIDE 0.9 % IV BOLUS (SEPSIS)
1000.0000 mL | Freq: Once | INTRAVENOUS | Status: AC
  Administered 2012-07-26: 1000 mL via INTRAVENOUS

## 2012-07-26 NOTE — Assessment & Plan Note (Addendum)
Slightly elevated today - attributed to meeting new doctor. No changes to antihypertensive regimen today - continue lisinopril hctz.

## 2012-07-26 NOTE — Assessment & Plan Note (Signed)
Chronic pain syndrome s/p multiple back surgeries and h/o arachnoiditis, on disability for this.  Latest surgery lumbar fusion 2004  I have requested records from Dr Foy Guadalajara, Dr Dutch Quint and Dr. Sharene Skeans. I had a long discussion with patient regarding significant amount of narcotic she is on. Advised I do not prescribe methadone and if she desires to continue this med will need to establish with pain clinic.  Otherwise I will be tapering her slowly off the methadone. States normal monthly amt is #600 tablets of metandone 10mg , however last fill was 06/28/2012 for #280 which has lasted her all of last month.  I advised I was not comfortable filling that many pills, and will prescribe her #280 today while we get her established with pain clinic - referral placed today. rtc 3 mo for f/u.

## 2012-07-26 NOTE — Assessment & Plan Note (Signed)
Reports compliance with aspirin daily.

## 2012-07-26 NOTE — Telephone Encounter (Signed)
At office visit yesterday, pt did get upset with me when I told her I would not prescribe large amount of narcotic she is on and she was very unpleasant. I discussed in no unclear terms expected code of conduct when coming to this office and if she was not civil with myself and my staff we would not be able to care for her at our clinic. She apologized and expressed understanding.

## 2012-07-26 NOTE — Assessment & Plan Note (Signed)
Refilled alprazolam today. Will need to discuss taper off this large dose as well.

## 2012-07-26 NOTE — Assessment & Plan Note (Signed)
See below

## 2012-07-26 NOTE — Assessment & Plan Note (Signed)
Will need FLP at next fasting blood work.

## 2012-07-26 NOTE — ED Notes (Signed)
Per EMS:  Pt has been falling recently, pt fell has fallen 3-4 times today.  With this fall pt st's she hit her head and reports a + LOC for 30-45 seconds.  EMS reports initial BP was 88 palpated, taken manually.  EMS also reports pt is bradycardic at 49.  Pt reports multiple syncopal episodes the past few days.  Pt is now alert and oriented x 4, resps even and unlabored.  Pupils pinpoint and reactive.  GCS 15.

## 2012-07-27 ENCOUNTER — Inpatient Hospital Stay (HOSPITAL_COMMUNITY): Payer: Medicare Other

## 2012-07-27 ENCOUNTER — Encounter (HOSPITAL_COMMUNITY): Payer: Self-pay | Admitting: *Deleted

## 2012-07-27 DIAGNOSIS — N390 Urinary tract infection, site not specified: Secondary | ICD-10-CM

## 2012-07-27 DIAGNOSIS — I959 Hypotension, unspecified: Secondary | ICD-10-CM

## 2012-07-27 DIAGNOSIS — I498 Other specified cardiac arrhythmias: Secondary | ICD-10-CM

## 2012-07-27 DIAGNOSIS — I059 Rheumatic mitral valve disease, unspecified: Secondary | ICD-10-CM

## 2012-07-27 DIAGNOSIS — N289 Disorder of kidney and ureter, unspecified: Secondary | ICD-10-CM

## 2012-07-27 DIAGNOSIS — G9340 Encephalopathy, unspecified: Secondary | ICD-10-CM

## 2012-07-27 DIAGNOSIS — M549 Dorsalgia, unspecified: Secondary | ICD-10-CM

## 2012-07-27 DIAGNOSIS — G8929 Other chronic pain: Secondary | ICD-10-CM

## 2012-07-27 LAB — URINALYSIS, ROUTINE W REFLEX MICROSCOPIC
Bilirubin Urine: NEGATIVE
Nitrite: NEGATIVE
Specific Gravity, Urine: 1.017 (ref 1.005–1.030)
Urobilinogen, UA: 0.2 mg/dL (ref 0.0–1.0)
pH: 5 (ref 5.0–8.0)

## 2012-07-27 LAB — URINE MICROSCOPIC-ADD ON

## 2012-07-27 LAB — BASIC METABOLIC PANEL
BUN: 27 mg/dL — ABNORMAL HIGH (ref 6–23)
Calcium: 8.2 mg/dL — ABNORMAL LOW (ref 8.4–10.5)
GFR calc Af Amer: 31 mL/min — ABNORMAL LOW (ref >90)
GFR calc non Af Amer: 27 mL/min — ABNORMAL LOW (ref >90)
Glucose, Bld: 87 mg/dL (ref 70–99)

## 2012-07-27 LAB — COMPREHENSIVE METABOLIC PANEL
ALT: 11 U/L (ref 0–35)
AST: 20 U/L (ref 0–37)
Alkaline Phosphatase: 171 U/L — ABNORMAL HIGH (ref 39–117)
CO2: 21 mEq/L (ref 19–32)
Chloride: 106 mEq/L (ref 96–112)
GFR calc Af Amer: 23 mL/min — ABNORMAL LOW (ref >90)
GFR calc non Af Amer: 20 mL/min — ABNORMAL LOW (ref >90)
Glucose, Bld: 73 mg/dL (ref 70–99)
Potassium: 4.3 mEq/L (ref 3.5–5.1)
Sodium: 138 mEq/L (ref 135–145)
Total Bilirubin: 0.2 mg/dL — ABNORMAL LOW (ref 0.3–1.2)

## 2012-07-27 LAB — CBC
HCT: 29.1 % — ABNORMAL LOW (ref 36.0–46.0)
Hemoglobin: 9.4 g/dL — ABNORMAL LOW (ref 12.0–15.0)
MCH: 30.3 pg (ref 26.0–34.0)
MCHC: 32.3 g/dL (ref 30.0–36.0)
RDW: 14.6 % (ref 11.5–15.5)

## 2012-07-27 LAB — LACTIC ACID, PLASMA: Lactic Acid, Venous: 0.5 mmol/L (ref 0.5–2.2)

## 2012-07-27 LAB — CORTISOL: Cortisol, Plasma: 2.1 ug/dL

## 2012-07-27 LAB — RAPID URINE DRUG SCREEN, HOSP PERFORMED
Barbiturates: NOT DETECTED
Benzodiazepines: POSITIVE — AB

## 2012-07-27 MED ORDER — DOPAMINE-DEXTROSE 3.2-5 MG/ML-% IV SOLN
2.0000 ug/kg/min | Freq: Once | INTRAVENOUS | Status: AC
  Administered 2012-07-27: 2 ug/kg/min via INTRAVENOUS
  Filled 2012-07-27: qty 250

## 2012-07-27 MED ORDER — SODIUM CHLORIDE 0.9 % IV SOLN
1000.0000 mL | INTRAVENOUS | Status: DC
  Administered 2012-07-28 – 2012-07-29 (×4): 1000 mL via INTRAVENOUS

## 2012-07-27 MED ORDER — LEVOFLOXACIN IN D5W 750 MG/150ML IV SOLN
750.0000 mg | Freq: Once | INTRAVENOUS | Status: AC
  Administered 2012-07-27: 750 mg via INTRAVENOUS
  Filled 2012-07-27: qty 150

## 2012-07-27 MED ORDER — IPRATROPIUM BROMIDE 0.02 % IN SOLN
0.5000 mg | RESPIRATORY_TRACT | Status: DC | PRN
  Administered 2012-07-28: 0.5 mg via RESPIRATORY_TRACT
  Filled 2012-07-27: qty 2.5

## 2012-07-27 MED ORDER — ALBUTEROL SULFATE HFA 108 (90 BASE) MCG/ACT IN AERS: 2.0000 | INHALATION_SPRAY | Freq: Four times a day (QID) | RESPIRATORY_TRACT | Status: DC | PRN

## 2012-07-27 MED ORDER — METHADONE HCL 10 MG PO TABS: 10.0000 mg | ORAL_TABLET | Freq: Two times a day (BID) | ORAL | Status: DC

## 2012-07-27 MED ORDER — MAGNESIUM SULFATE 50 % IJ SOLN: 2.0000 g | Freq: Once | INTRAVENOUS | Status: DC

## 2012-07-27 MED ORDER — HYDROCORTISONE SOD SUCCINATE 100 MG IJ SOLR
50.0000 mg | Freq: Four times a day (QID) | INTRAMUSCULAR | Status: DC
  Administered 2012-07-27: 11:00:00 via INTRAVENOUS
  Administered 2012-07-27 – 2012-07-28 (×6): 50 mg via INTRAVENOUS
  Filled 2012-07-27 (×8): qty 1
  Filled 2012-07-27: qty 2
  Filled 2012-07-27 (×4): qty 1

## 2012-07-27 MED ORDER — SODIUM CHLORIDE 0.9 % IV SOLN: 250.0000 mL | INTRAVENOUS | Status: DC | PRN

## 2012-07-27 MED ORDER — ASPIRIN 81 MG PO CHEW
81.0000 mg | CHEWABLE_TABLET | Freq: Every day | ORAL | Status: DC
  Administered 2012-07-27 – 2012-08-02 (×7): 81 mg via ORAL
  Filled 2012-07-27 (×7): qty 1

## 2012-07-27 MED ORDER — ALPRAZOLAM 0.5 MG PO TABS
2.0000 mg | ORAL_TABLET | Freq: Four times a day (QID) | ORAL | Status: DC
  Administered 2012-07-27 – 2012-07-28 (×5): 2 mg via ORAL
  Filled 2012-07-27 (×5): qty 4

## 2012-07-27 MED ORDER — CIPROFLOXACIN IN D5W 400 MG/200ML IV SOLN
400.0000 mg | INTRAVENOUS | Status: DC
  Filled 2012-07-27: qty 200

## 2012-07-27 MED ORDER — SODIUM CHLORIDE 0.9 % IV SOLN: INTRAVENOUS | Status: AC

## 2012-07-27 MED ORDER — ALPRAZOLAM 0.5 MG PO TABS: 0.5000 mg | ORAL_TABLET | Freq: Two times a day (BID) | ORAL | Status: DC | PRN

## 2012-07-27 MED ORDER — ONDANSETRON HCL 4 MG/2ML IJ SOLN
4.0000 mg | Freq: Four times a day (QID) | INTRAMUSCULAR | Status: DC | PRN
  Administered 2012-07-28 – 2012-07-29 (×2): 4 mg via INTRAVENOUS
  Filled 2012-07-27 (×2): qty 2

## 2012-07-27 MED ORDER — ALBUTEROL SULFATE (5 MG/ML) 0.5% IN NEBU
2.5000 mg | INHALATION_SOLUTION | RESPIRATORY_TRACT | Status: DC | PRN
  Administered 2012-07-28: 2.5 mg via RESPIRATORY_TRACT
  Filled 2012-07-27: qty 0.5

## 2012-07-27 MED ORDER — MAGNESIUM SULFATE 40 MG/ML IJ SOLN
2.0000 g | Freq: Once | INTRAMUSCULAR | Status: AC
  Administered 2012-07-27: 2 g via INTRAVENOUS
  Filled 2012-07-27: qty 50

## 2012-07-27 MED ORDER — SODIUM CHLORIDE 0.9 % IV BOLUS (SEPSIS)
1000.0000 mL | Freq: Once | INTRAVENOUS | Status: AC
  Administered 2012-07-27: 1000 mL via INTRAVENOUS

## 2012-07-27 MED ORDER — HYDROCORTISONE SOD SUCCINATE 100 MG IJ SOLR: 50.0000 mg | Freq: Four times a day (QID) | INTRAMUSCULAR | Status: DC

## 2012-07-27 MED ORDER — SODIUM CHLORIDE 0.9 % IV SOLN
1000.0000 mL | Freq: Once | INTRAVENOUS | Status: AC
  Administered 2012-07-27: 1000 mL via INTRAVENOUS

## 2012-07-27 MED ORDER — HEPARIN SODIUM (PORCINE) 5000 UNIT/ML IJ SOLN
5000.0000 [IU] | Freq: Three times a day (TID) | INTRAMUSCULAR | Status: DC
  Administered 2012-07-27 – 2012-08-02 (×18): 5000 [IU] via SUBCUTANEOUS
  Filled 2012-07-27 (×23): qty 1

## 2012-07-27 MED ORDER — METHADONE HCL 10 MG PO TABS
15.0000 mg | ORAL_TABLET | Freq: Three times a day (TID) | ORAL | Status: DC
  Administered 2012-07-27 – 2012-07-28 (×3): 15 mg via ORAL
  Filled 2012-07-27 (×2): qty 2

## 2012-07-27 MED ORDER — ADULT MULTIVITAMIN W/MINERALS CH
1.0000 | ORAL_TABLET | Freq: Every day | ORAL | Status: DC
  Administered 2012-07-27 – 2012-08-02 (×7): 1 via ORAL
  Filled 2012-07-27 (×7): qty 1

## 2012-07-27 NOTE — ED Notes (Signed)
Pt sitting up talking now.  Wants pain and anxiety meds.

## 2012-07-27 NOTE — Consult Note (Signed)
Reason for Consult: bradycardia Referring Physician: Dr. Brown Human is an 67 y.o. female.  HPI:  67 yo woman with chronic pain, hypertension, prior admission for SOMA overdose who has had multiple falls at home and came to hospital confused, hypotensive and found to be bradycardia. She has been more weak at home, not eating well and has had some back/hip pain causing more severe pain. Her husband was with her with the critical care team evaluated her and he stated she's fallen several times, hit her head and acted more confused and weak. She was not excited and even refused to come to the hospital. Cardiology consulted for bradycardia as low as 40s. She responded to IV fluids and no apparent vasopressors used as yet. She's still complaining of pain on my exam and doesn't want to talk about anything but her pain. I was unable to perform a focused ROS so this is obtained from prior notes/chart.     Past Medical History  Diagnosis Date  . Arachnoiditis   . Hypertension   . Chronic back pain   . Arthritis   . Syncopal episodes   . Heart murmur   . Chronic bronchitis   . HLD (hyperlipidemia)   . Colon polyps   . H/O scarlet fever   . H/O: CVA (cerebrovascular accident)     TIA  . Urine incontinence   . Anxiety     severe with panic attacks    Past Surgical History  Procedure Laterality Date  . Laminectomy  [id number]    x 4  . Abdominal hysterectomy  1985  . Appendectomy  1963  . Breast biopsy  1966  . Cardiac catheterization  2004  . Lumbar fusion  2004    Family History  Problem Relation Age of Onset  . Arthritis    . CAD Mother     MI  . CAD Father     MI s/p 3v CABG  . Kidney failure Father     ESRD on HD  . Stroke Mother   . Cancer Neg Hx   . Diabetes Neg Hx     Social History:  reports that she has been smoking Cigarettes.  She has been smoking about 0.25 packs per day. She has never used smokeless tobacco. She reports that she does not drink alcohol or  use illicit drugs.  Allergies:  Allergies  Allergen Reactions  . Penicillins Anaphylaxis  . Sulfa Antibiotics Anaphylaxis  . Sertraline Hcl Other (See Comments)    Caused pt to be hyper and confussed  . Other     ALL ANTI DEPRESSANTS; MUSHROOMS  . Toradol (Ketorolac Tromethamine) Nausea And Vomiting  . Versed (Midazolam)     DID NOT WORK  . Codeine Itching and Rash    Medications:  Scheduled: . aspirin  81 mg Oral Daily  . [START ON 07/28/2012] ciprofloxacin  400 mg Intravenous Q24H  . DOPamine  2-20 mcg/kg/min Intravenous Once  . heparin  5,000 Units Subcutaneous Q8H  . hydrocortisone sodium succinate  50 mg Intravenous Q6H  . methadone  10 mg Oral Q12H  . multivitamin with minerals  1 tablet Oral Daily    Results for orders placed during the hospital encounter of 07/26/12 (from the past 48 hour(s))  CBC     Status: Abnormal   Collection Time    07/26/12 11:34 PM      Result Value Range   WBC 6.0  4.0 - 10.5 K/uL   RBC 3.50 (*)  3.87 - 5.11 MIL/uL   Hemoglobin 10.6 (*) 12.0 - 15.0 g/dL   HCT 62.3 (*) 76.2 - 83.1 %   MCV 92.6  78.0 - 100.0 fL   MCH 30.3  26.0 - 34.0 pg   MCHC 32.7  30.0 - 36.0 g/dL   RDW 51.7  61.6 - 07.3 %   Platelets 167  150 - 400 K/uL  POCT I-STAT TROPONIN I     Status: None   Collection Time    07/26/12 11:46 PM      Result Value Range   Troponin i, poc 0.01  0.00 - 0.08 ng/mL   Comment 3            Comment: Due to the release kinetics of cTnI,     a negative result within the first hours     of the onset of symptoms does not rule out     myocardial infarction with certainty.     If myocardial infarction is still suspected,     repeat the test at appropriate intervals.  POCT I-STAT, CHEM 8     Status: Abnormal   Collection Time    07/26/12 11:47 PM      Result Value Range   Sodium 142  135 - 145 mEq/L   Potassium 4.2  3.5 - 5.1 mEq/L   Chloride 110  96 - 112 mEq/L   BUN 36 (*) 6 - 23 mg/dL   Creatinine, Ser 7.10 (*) 0.50 - 1.10 mg/dL    Glucose, Bld 73  70 - 99 mg/dL   Calcium, Ion 6.26  9.48 - 1.30 mmol/L   TCO2 25  0 - 100 mmol/L   Hemoglobin 10.9 (*) 12.0 - 15.0 g/dL   HCT 54.6 (*) 27.0 - 35.0 %  CG4 I-STAT (LACTIC ACID)     Status: None   Collection Time    07/26/12 11:49 PM      Result Value Range   Lactic Acid, Venous 0.69  0.5 - 2.2 mmol/L  COMPREHENSIVE METABOLIC PANEL     Status: Abnormal   Collection Time    07/27/12 12:05 AM      Result Value Range   Sodium 138  135 - 145 mEq/L   Potassium 4.3  3.5 - 5.1 mEq/L   Chloride 106  96 - 112 mEq/L   CO2 21  19 - 32 mEq/L   Glucose, Bld 73  70 - 99 mg/dL   BUN 34 (*) 6 - 23 mg/dL   Creatinine, Ser 0.93 (*) 0.50 - 1.10 mg/dL   Calcium 9.3  8.4 - 81.8 mg/dL   Total Protein 6.7  6.0 - 8.3 g/dL   Albumin 3.2 (*) 3.5 - 5.2 g/dL   AST 20  0 - 37 U/L   ALT 11  0 - 35 U/L   Alkaline Phosphatase 171 (*) 39 - 117 U/L   Total Bilirubin 0.2 (*) 0.3 - 1.2 mg/dL   GFR calc non Af Amer 20 (*) >90 mL/min   GFR calc Af Amer 23 (*) >90 mL/min   Comment:            The eGFR has been calculated     using the CKD EPI equation.     This calculation has not been     validated in all clinical     situations.     eGFR's persistently     <90 mL/min signify     possible Chronic Kidney Disease.  PROCALCITONIN  Status: None   Collection Time    07/27/12 12:05 AM      Result Value Range   Procalcitonin <0.10     Comment:            Interpretation:     PCT (Procalcitonin) <= 0.5 ng/mL:     Systemic infection (sepsis) is not likely.     Local bacterial infection is possible.     (NOTE)             ICU PCT Algorithm               Non ICU PCT Algorithm        ----------------------------     ------------------------------             PCT < 0.25 ng/mL                 PCT < 0.1 ng/mL         Stopping of antibiotics            Stopping of antibiotics           strongly encouraged.               strongly encouraged.        ----------------------------      ------------------------------           PCT level decrease by               PCT < 0.25 ng/mL           >= 80% from peak PCT           OR PCT 0.25 - 0.5 ng/mL          Stopping of antibiotics                                                 encouraged.         Stopping of antibiotics               encouraged.        ----------------------------     ------------------------------           PCT level decrease by              PCT >= 0.25 ng/mL           < 80% from peak PCT            AND PCT >= 0.5 ng/mL            Continuing antibiotics                                                  encouraged.           Continuing antibiotics                encouraged.        ----------------------------     ------------------------------         PCT level increase compared          PCT > 0.5 ng/mL             with peak PCT AND  PCT >= 0.5 ng/mL             Escalation of antibiotics                                              strongly encouraged.          Escalation of antibiotics            strongly encouraged.  URINALYSIS, ROUTINE W REFLEX MICROSCOPIC     Status: Abnormal   Collection Time    07/27/12 12:07 AM      Result Value Range   Color, Urine YELLOW  YELLOW   APPearance CLOUDY (*) CLEAR   Specific Gravity, Urine 1.017  1.005 - 1.030   pH 5.0  5.0 - 8.0   Glucose, UA NEGATIVE  NEGATIVE mg/dL   Hgb urine dipstick NEGATIVE  NEGATIVE   Bilirubin Urine NEGATIVE  NEGATIVE   Ketones, ur NEGATIVE  NEGATIVE mg/dL   Protein, ur NEGATIVE  NEGATIVE mg/dL   Urobilinogen, UA 0.2  0.0 - 1.0 mg/dL   Nitrite NEGATIVE  NEGATIVE   Leukocytes, UA MODERATE (*) NEGATIVE  URINE MICROSCOPIC-ADD ON     Status: Abnormal   Collection Time    07/27/12 12:07 AM      Result Value Range   Squamous Epithelial / LPF FEW (*) RARE   WBC, UA 21-50  <3 WBC/hpf   Bacteria, UA RARE  RARE   Casts GRANULAR CAST (*) NEGATIVE   Comment: HYALINE CASTS  URINE RAPID DRUG SCREEN (HOSP PERFORMED)     Status:  Abnormal   Collection Time    07/27/12 12:07 AM      Result Value Range   Opiates NONE DETECTED  NONE DETECTED   Cocaine NONE DETECTED  NONE DETECTED   Benzodiazepines POSITIVE (*) NONE DETECTED   Amphetamines NONE DETECTED  NONE DETECTED   Tetrahydrocannabinol NONE DETECTED  NONE DETECTED   Barbiturates NONE DETECTED  NONE DETECTED   Comment:            DRUG SCREEN FOR MEDICAL PURPOSES     ONLY.  IF CONFIRMATION IS NEEDED     FOR ANY PURPOSE, NOTIFY LAB     WITHIN 5 DAYS.                LOWEST DETECTABLE LIMITS     FOR URINE DRUG SCREEN     Drug Class       Cutoff (ng/mL)     Amphetamine      1000     Barbiturate      200     Benzodiazepine   200     Tricyclics       300     Opiates          300     Cocaine          300     THC              50    Ct Head Wo Contrast  07/27/2012  *RADIOLOGY REPORT*  Clinical Data:  Fall, loss of consciousness,  CT HEAD WITHOUT CONTRAST CT CERVICAL SPINE WITHOUT CONTRAST  Technique:  Multidetector CT imaging of the head and cervical spine was performed following the standard protocol without intravenous contrast.  Multiplanar CT image reconstructions of the cervical spine were also generated.  Comparison:  Head  CT 04/23/2012  CT HEAD  Findings: No intracranial hemorrhage.  No parenchymal contusion. No midline shift or mass effect.  Basilar cisterns are patent. No skull base fracture.  No fluid in the paranasal sinuses or mastoid air cells.  IMPRESSION: No intracranial trauma.  CT CERVICAL SPINE  Findings: No prevertebral soft tissue swelling.  Normal alignment of cervical vertebral bodies.  No loss of vertebral body height. Normal facet articulation.  Normal craniocervical junction.  No evidence epidural or paraspinal hematoma.  There is mild joint space narrowing and endplate osteophytosis at C5-C6.  IMPRESSION: No evidence of acute cervical spine fracture.  Mild spondylosis.   Original Report Authenticated By: Genevive Bi, M.D.    Ct Cervical Spine  Wo Contrast  07/27/2012  *RADIOLOGY REPORT*  Clinical Data:  Fall, loss of consciousness,  CT HEAD WITHOUT CONTRAST CT CERVICAL SPINE WITHOUT CONTRAST  Technique:  Multidetector CT imaging of the head and cervical spine was performed following the standard protocol without intravenous contrast.  Multiplanar CT image reconstructions of the cervical spine were also generated.  Comparison:  Head CT 04/23/2012  CT HEAD  Findings: No intracranial hemorrhage.  No parenchymal contusion. No midline shift or mass effect.  Basilar cisterns are patent. No skull base fracture.  No fluid in the paranasal sinuses or mastoid air cells.  IMPRESSION: No intracranial trauma.  CT CERVICAL SPINE  Findings: No prevertebral soft tissue swelling.  Normal alignment of cervical vertebral bodies.  No loss of vertebral body height. Normal facet articulation.  Normal craniocervical junction.  No evidence epidural or paraspinal hematoma.  There is mild joint space narrowing and endplate osteophytosis at C5-C6.  IMPRESSION: No evidence of acute cervical spine fracture.  Mild spondylosis.   Original Report Authenticated By: Genevive Bi, M.D.    Dg Chest Port 1 View  07/27/2012  *RADIOLOGY REPORT*  Clinical Data: Shortness of breath.  PORTABLE CHEST - 1 VIEW  Comparison: Chest radiograph performed 07/26/2012  Findings: The lungs are well-aerated.  Mild bibasilar opacities raise concern for pneumonia.  Pulmonary vascularity is at the upper limits of normal.  Chronic peribronchial thickening is seen.  No pleural effusion or pneumothorax is identified.  The cardiomediastinal silhouette is borderline enlarged.  No acute osseous abnormalities are seen.  IMPRESSION:  1.  Mild bibasilar opacities raise concern for pneumonia. 2.  Borderline cardiomegaly.   Original Report Authenticated By: Tonia Ghent, M.D.    Dg Chest Portable 1 View  07/26/2012  *RADIOLOGY REPORT*  Clinical Data: Syncope  PORTABLE CHEST - 1 VIEW  Comparison: Chest radiograph  05/31/2012  Findings: Stable enlarged cardiac silhouette.  No effusion, infiltrate, or pneumothorax.  No aggressive osseous lesions.  IMPRESSION: Cardiomegaly without acute cardiopulmonary findings.   Original Report Authenticated By: Genevive Bi, M.D.     Review of Systems  Constitutional: Positive for malaise/fatigue. Negative for fever and chills.  HENT: Negative for hearing loss.   Eyes: Negative for blurred vision, photophobia and pain.  Respiratory: Positive for shortness of breath.   Cardiovascular: Negative for chest pain, palpitations and orthopnea.  Gastrointestinal: Positive for nausea, vomiting, abdominal pain and diarrhea.  Genitourinary: Negative for dysuria and urgency.  Musculoskeletal: Positive for back pain, joint pain and falls.  Skin: Negative for itching and rash.  Neurological: Positive for dizziness, sensory change, speech change and headaches. Negative for seizures.  Endo/Heme/Allergies: Negative for environmental allergies and polydipsia.  Psychiatric/Behavioral: Positive for substance abuse. Negative for hallucinations.   Blood pressure 109/56, pulse 50, temperature 93.7 F (34.3 C),  temperature source Core (Comment), resp. rate 18, height 5' 3.5" (1.613 m), weight 59.3 kg (130 lb 11.7 oz), SpO2 97.00%. Physical Exam  Nursing note and vitals reviewed. Constitutional: She appears well-developed and well-nourished. No distress.  HENT:  Head: Normocephalic and atraumatic.  Nose: Nose normal.  Mouth/Throat: Oropharynx is clear and moist. No oropharyngeal exudate.  Eyes: Conjunctivae and EOM are normal. Pupils are equal, round, and reactive to light. No scleral icterus.  Neck: Normal range of motion. Neck supple. No JVD present. No tracheal deviation present. No thyromegaly present.  Cardiovascular: Regular rhythm and intact distal pulses.  Exam reveals no gallop.   No murmur heard. bradycardia  Respiratory: Effort normal. She has rales.  Scattered rales at  the bases  GI: Soft. Bowel sounds are normal. She exhibits no distension. There is no tenderness. There is no rebound.  Musculoskeletal: Normal range of motion. She exhibits no edema and no tenderness.  Warm extremities  Neurological: She is alert. No cranial nerve deficit. Coordination normal.  Skin: Skin is warm and dry. No rash noted. She is not diaphoretic. No erythema.   Labs reviewed; na 138, K 4.3, bun/cr 34/2.43 CTH unrevealing Chest x-ray: ? Infiltrate, cardiomegaly ECG: difficult to discern P-wave on ECG given baseline but bradycardia, regular 40s; her monitor has HR 50s with obvious p-waves and normal PR interval, QTc 460 by computer but eyeball much less than 1/2 RR interval ECG: priors reviewed and sinus bradycardia 40s-50s on several occasions  Problem List Hypotension Confusion/Falls/Slurred speech Bradycardia Tobacco use Pyuria/UTI COPD Recent Soma overdose Chronic Pain on high dose methadone Acute renal insufficiency  Assessment/Plan: 67 yo woman with PMH of tobacco use, chronic pain on high dose methadone who presents with confusion/falls and found to have hypotension, renal insufficiency and bradycardia. She's responded well hemodynamically to fluids and her extremities are warm. I believe some of her bradycardia might be related to her metabolic derangement/overdose/hypothermia/infection. Previous ECGs demonstrate sinus bradycardia. Her blood pressure has improved and her HR seems to be hovering in the 50s. Her QTc should be followed in setting of antibiotic use (fluoroquinolone) and methadone (agree with reduced dose) with daily ECGs.  - daily ECG for QTc - consider use of another antibiotic instead of FQ  - agree with holding any hypertensive medications - evaluate for thyroid, cortisol related hypotension as you are - Call with any questions   KELLY, JACOB 07/27/2012, 5:21 AM

## 2012-07-27 NOTE — Progress Notes (Signed)
ANTIBIOTIC CONSULT NOTE - INITIAL  Pharmacy Consult for Ciprofloxacin Indication: R/o UTI  Allergies  Allergen Reactions  . Penicillins Anaphylaxis  . Sulfa Antibiotics Anaphylaxis  . Sertraline Hcl Other (See Comments)    Caused pt to be hyper and confussed  . Other     ALL ANTI DEPRESSANTS; MUSHROOMS  . Toradol (Ketorolac Tromethamine) Nausea And Vomiting  . Versed (Midazolam)     DID NOT WORK  . Codeine Itching and Rash    Patient Measurements: Height: 5' 3.5" (161.3 cm) Weight: 130 lb 11.7 oz (59.3 kg) IBW/kg (Calculated) : 53.55  Vital Signs: Temp: 94.3 F (34.6 C) (03/06 0315) Temp src: Rectal (03/05 2303) BP: 94/59 mmHg (03/06 0315) Pulse Rate: 54 (03/06 0315) Intake/Output from previous day:   Intake/Output from this shift:    Labs:  Recent Labs  07/26/12 2334 07/26/12 2347 07/27/12 0005  WBC 6.0  --   --   HGB 10.6* 10.9*  --   PLT 167  --   --   CREATININE  --  2.20* 2.43*   Estimated Creatinine Clearance: 19.5 ml/min (by C-G formula based on Cr of 2.43). No results found for this basename: VANCOTROUGH, VANCOPEAK, VANCORANDOM, GENTTROUGH, GENTPEAK, GENTRANDOM, TOBRATROUGH, TOBRAPEAK, TOBRARND, AMIKACINPEAK, AMIKACINTROU, AMIKACIN,  in the last 72 hours   Microbiology: No results found for this or any previous visit (from the past 720 hour(s)).  Medical History: Past Medical History  Diagnosis Date  . Arachnoiditis   . Hypertension   . Chronic back pain   . Arthritis   . Syncopal episodes   . Heart murmur   . Chronic bronchitis   . HLD (hyperlipidemia)   . Colon polyps   . H/O scarlet fever   . H/O: CVA (cerebrovascular accident)     TIA  . Urine incontinence   . Anxiety     severe with panic attacks    Medications:  Scheduled:  . [COMPLETED] sodium chloride  1,000 mL Intravenous Once   Followed by  . [COMPLETED] sodium chloride  1,000 mL Intravenous Once  . aspirin  81 mg Oral Daily  . DOPamine  2-20 mcg/kg/min Intravenous Once   . heparin  5,000 Units Subcutaneous Q8H  . hydrocortisone sodium succinate  50 mg Intravenous Q6H  . [COMPLETED] levofloxacin (LEVAQUIN) IV  750 mg Intravenous Once  . methadone  10 mg Oral Q12H  . multivitamin with minerals  1 tablet Oral Daily  . [COMPLETED] sodium chloride  1,000 mL Intravenous Once  . [COMPLETED] sodium chloride  1,000 mL Intravenous Once  . [COMPLETED] sodium chloride  1,000 mL Intravenous Once   Assessment: 67 yo female admitted with hypothermia, hypotension and bradycardia. Pharmacy to manage ciprofloxacin for r/o UTI. Patient has already received levofloxacin 750mg  IV x 1. Estimated CrCl ~ 20 mL/min.   Plan:  1. Ciprofloxacin 400mg  IV Q24H.   Emeline Gins 07/27/2012,4:26 AM

## 2012-07-27 NOTE — H&P (Signed)
PULMONARY  / CRITICAL CARE MEDICINE  Name: [name] [name] MRN: [id number] DOB: 21-Aug-1945    ADMISSION DATE:  07/26/2012 CONSULTATION DATE:  07/27/2012  REFERRING MD :  Dierdre Highman  CHIEF COMPLAINT:  Falls, confusion, hypotension  BRIEF PATIENT DESCRIPTION: 67 y/o female with chronic pain, and hypertension who presented to the [facility] ED on 3/5 after multiple falls at home and confusion.  PCCM consulted due to hypotension and bradycardia.  SIGNIFICANT EVENTS / STUDIES:  3/6 CT head >>  NAICP 3/6 CT c-spine >> no cervical spine fracture 3/5 CXR > impression: cardiomegaly without cardiopulmonary findings  LINES / TUBES:   CULTURES: 3/6 blood >> 3/6 urine >>  ANTIBIOTICS: 3/6 levaquin x1 3/6 cipro (UTI) >>  HISTORY OF PRESENT ILLNESS:  67 y/o female with chronic pain, and hypertension who presented to the [facility] ED on 3/5 after multiple falls at home and confusion.  PCCM consulted due to hypotension and bradycardia.  She states that for several days she has been feeling more weak with poor appetite,nausea and vomiting, and some diarrhea.  She notes some pain in her hip and back which is slightly more than her chronic pain in these areas.  Her husband states that for the last two days she has been more confused, weak, and has fallen several times.  He states that she has hit her head on several occasions.  She refused to come to the ED despite his prompting until she lost consciousness for a few seconds after a fall late in the evening on 3/5.  She denies fevers, cough, or dyspnea.  In the ED she received 4L normal saline and a warming blanket.  She denies drug overdose, taking meds that were not hers, alcohol use, or prolonged exposure outside.  PAST MEDICAL HISTORY :  Past Medical History  Diagnosis Date  . Arachnoiditis   . Hypertension   . Chronic back pain   . Arthritis   . Syncopal episodes   . Heart murmur   . Chronic bronchitis   . HLD (hyperlipidemia)   . Colon polyps   . H/O scarlet  fever   . H/O: CVA (cerebrovascular accident)     TIA  . Urine incontinence   . Anxiety     severe with panic attacks   Past Surgical History  Procedure Laterality Date  . Laminectomy  4098,1191    x 4  . Abdominal hysterectomy  1985  . Appendectomy  1963  . Breast biopsy  1966  . Cardiac catheterization  2004  . Lumbar fusion  2004   Prior to Admission medications   Medication Sig Start Date End Date Taking? Authorizing Provider  albuterol (PROVENTIL HFA;VENTOLIN HFA) 108 (90 BASE) MCG/ACT inhaler Inhale 2 puffs into the lungs every 6 (six) hours as needed for wheezing. 05/31/12   Clanford Cyndie Mull, MD  ALPRAZolam Prudy Feeler) 1 MG tablet Take 2 tablets (2 mg total) by mouth 4 (four) times daily as needed for anxiety. For anxiety 07/25/12   [name], MD  aspirin 81 MG chewable tablet Chew 1 tablet (81 mg total) by mouth daily. 03/13/12   Russella Dar, NP  gabapentin (NEURONTIN) 300 MG capsule Take 4 capsules (1,200 mg total) by mouth at bedtime. Take 4 caps po QHS and take 1 cap at lunchtime 07/25/12   [name], MD  lisinopril-hydrochlorothiazide (PRINZIDE,ZESTORETIC) 10-12.5 MG per tablet Take 1 tablet by mouth daily. 05/10/12   Clanford Cyndie Mull, MD  methadone (DOLOPHINE) 10 MG tablet Take 5 tablets (  50 mg total) by mouth every 6 (six) hours as needed for pain. 07/25/12   [name], MD  Multiple Vitamin (MULTIVITAMIN WITH MINERALS) TABS Take 1 tablet by mouth daily.    Historical Provider, MD   Allergies  Allergen Reactions  . Penicillins Anaphylaxis  . Sulfa Antibiotics Anaphylaxis  . Sertraline Hcl Other (See Comments)    Caused pt to be hyper and confussed  . Other     ALL ANTI DEPRESSANTS; MUSHROOMS  . Toradol (Ketorolac Tromethamine) Nausea And Vomiting  . Versed (Midazolam)     DID NOT WORK  . Codeine Itching and Rash    FAMILY HISTORY:  Family History  Problem Relation Age of Onset  . Arthritis    . CAD Mother     MI  . CAD Father     MI s/p 3v  CABG  . Kidney failure Father     ESRD on HD  . Stroke Mother   . Cancer Neg Hx   . Diabetes Neg Hx    SOCIAL HISTORY:  reports that she has been smoking Cigarettes.  She has been smoking about 0.25 packs per day. She has never used smokeless tobacco. She reports that she does not drink alcohol or use illicit drugs.  REVIEW OF SYSTEMS:   Gen: Denies fever, chills, weight change, + fatigue, night sweats HEENT: Denies blurred vision, double vision, hearing loss, tinnitus, sinus congestion, rhinorrhea, sore throat, neck stiffness, dysphagia PULM: Denies shortness of breath, cough, sputum production, hemoptysis, wheezing CV: Denies chest pain, edema, orthopnea, paroxysmal nocturnal dyspnea, palpitations GI: Denies abdominal pain, + nausea, + vomiting, + diarrhea, hematochezia, melena, constipation, change in bowel habits GU: Denies dysuria, hematuria, polyuria, oliguria, urethral discharge Endocrine: Denies hot or cold intolerance, polyuria, polyphagia or appetite change Derm: Denies rash, dry skin, scaling or peeling skin change Heme: Denies easy bruising, bleeding, bleeding gums Neuro: + headache, numbness, weakness, slurred speech, loss of memory or consciousness   SUBJECTIVE:   VITAL SIGNS: Temp:  [93.6 F (34.2 C)] 93.6 F (34.2 C) (03/05 2303) Pulse Rate:  [43-52] 51 (03/06 0145) Resp:  [6-20] 13 (03/06 0145) BP: (74-109)/(38-65) 91/49 mmHg (03/06 0145) SpO2:  [90 %-98 %] 93 % (03/06 0145) Weight:  [59.3 kg (130 lb 11.7 oz)] 59.3 kg (130 lb 11.7 oz) (03/06 0100) HEMODYNAMICS:   VENTILATOR SETTINGS:   INTAKE / OUTPUT: Intake/Output   None     PHYSICAL EXAMINATION: Gen: chronically ill appearing, no acute distress HEENT: NCAT, PERRL, EOMi, OP clear, neck supple without masses PULM: Insp crackles 1/2 way up bilaterally CV: RRR, no mgr, no JVD AB: BS+, soft, nontender, no hsm Ext: warm, no edema, no clubbing, no cyanosis Derm: no rash or skin breakdown Neuro: Awake  and alert, oriented x4, slurred speech, grip strength 5/5 L, 4/5 R, complains of pain with lower extremity neuro exam; no clonus; DTR's hyporeflexic bilaterally   LABS:  Recent Labs Lab 07/26/12 2334 07/26/12 2347 07/26/12 2349 07/27/12 0005  HGB 10.6* 10.9*  --   --   WBC 6.0  --   --   --   PLT 167  --   --   --   NA  --  142  --  138  K  --  4.2  --  4.3  CL  --  110  --  106  CO2  --   --   --  21  GLUCOSE  --  73  --  73  BUN  --  36*  --  34*  CREATININE  --  2.20*  --  2.43*  CALCIUM  --   --   --  9.3  AST  --   --   --  20  ALT  --   --   --  11  ALKPHOS  --   --   --  171*  BILITOT  --   --   --  0.2*  PROT  --   --   --  6.7  ALBUMIN  --   --   --  3.2*  LATICACIDVEN  --   --  0.69  --   PROCALCITON  --   --   --  <0.10   No results found for this basename: GLUCAP,  in the last 168 hours  3/6 CXR: hyperinflation, cardiomegaly, no infiltrate 3/6 EKG: Sinus brady, no ST wave changes  ASSESSMENT / PLAN:  CARDIOVASCULAR A: Shock with sinus bradycardia; suspicious for either med overdose (husband's meds?) vs other metabolic cause such as hypothyroidism or adrenal insufficiency; Doubt septic shock given lack of WBC elevation, no focal symptoms on exam, procalcitonin normal and lactic acid normal  Also some component of hypovolemia given nausea and vomiting at home and poor po intake P:  -hold home BP meds -continue gentle IVF -monitor volume status closely with IVF -follow UOP -echo -f/u cardiology consult -tele -no sepsis protocol given improvement in symptoms and not clearly septic  -hydrocortisone given possibility of adrenal insufficiency; check cortisol level first -dopamine PRN MAP < 60, if she needs this will place CVL   NEUROLOGIC A:  Confusion combined with bradycardia and hypotension; worrisome for b-blocker or other nodal blocking drug OD  History of accidental overdose with SOMA during 02/2012 admission to [facility]  Chronic pain on extremely  high doses of methadone P:   -hold home gabapentin -cut back home methadone to 10mg  bid 3/6 given encephalopathy, monitor closely for withdrawal -if this proves to be another overdose, would have psyche see her as this would be episode #2 requiring a hospitalization in 4 months  PULMONARY A:  COPD, currently breathing comfortably P:   -check repeat CXR given crackles on exam after 4 L NS -prn albuterol   RENAL A:  AKI likely due to hypovolemia/shock P:   -continue gentle IVF resuscitation  GASTROINTESTINAL A:  Nausea and vomiting P:   -prn zofran  HEMATOLOGIC A:  Chronic anemia, not actively bleeding P:  -monitor for bleeding  INFECTIOUS A:   UTI Hypothermic, but does not meet SIRS criteria, favor drug overdose, metabolic derangement (hypothyroidism?) more likely P:   -f/u blood/urine cultures -cipro given b-lactam allergy -f/u TSH  ENDOCRINE A:  ? Hypothyroidism or hypoadrenalism? P:   -f/u TSH/cortisol -start Hydrocortisone empirically for adrenal insufficiency after cortisol level drawn and stop if cortisol appropriate  TODAY'S SUMMARY: 67 y/o female admitted on 3/6 with hypothermia, hypotension and bradycardia of uncertain etiology.  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  Favor overdose again (she denies this) vs metabolic derangement such as hypothyroidism/hypoadrenalism.  At this point uncertain exactly what is going on.  Will treat for UTI, give hydrocortisone, give IVF and will place CVL if becomes hypotensive again.  At this point she is responding to IVF.  I have personally obtained a history, examined the patient, evaluated laboratory and imaging results, formulated the assessment and plan and placed orders. CRITICAL CARE: The patient  is critically ill with multiple organ systems failure and requires high complexity decision making for assessment and support, frequent  evaluation and titration of therapies, application of advanced monitoring technologies and extensive interpretation of multiple databases. Critical Care Time devoted to patient care services described in this note is 60 minutes.   Fonnie Jarvis Pulmonary and Critical Care Medicine [facility] Pager: [phone]  07/27/2012, 2:06 AM

## 2012-07-27 NOTE — Progress Notes (Signed)
UR COMPLETED  

## 2012-07-27 NOTE — ED Notes (Signed)
In pts room.  Pt has thrown off cover of bear hugger.  States she will not keep this on, its to hot.

## 2012-07-27 NOTE — Clinical Social Work Note (Signed)
Patient screened by Clinical Social Worker on July 27, 2012 for substance abuse . As a result, patient has received a full psychosocial assessment and will be documented within the next business day.  [name] [name], [place] [phone]

## 2012-07-27 NOTE — ED Notes (Signed)
Pt bp 100/49.  md states to hold dopamine.

## 2012-07-27 NOTE — Progress Notes (Signed)
Echocardiogram 2D Echocardiogram has been performed.  BROWN, CALEBA 07/27/2012, 9:15 AM

## 2012-07-27 NOTE — ED Notes (Signed)
I stat lactic acid. 0.69

## 2012-07-27 NOTE — ED Provider Notes (Signed)
History     CSN: [id number]  Arrival date & time 07/26/12  2242   First MD Initiated Contact with Patient 07/26/12 2300      No chief complaint on file.   (Consider location/radiation/quality/duration/timing/severity/associated sxs/prior treatment) HPI Hx per EMS, PT and husband.  Last night fell and injured her head.  She has been falling frequently and although ? LOC, PT did not want to come to the ED.  Today confusion and AMS per husband and fell another 3-4 times, so he called EMS, no vomiting or fevers, has unchanged chronic LBP. PT complains of some CP substernal unable to qualify the pain that is not radiating, no associated cough ro SOB. No extremity trauma or pain.   EMS noted low BP and HR 40s. PT answers basic questions appropriately.  Past Medical History  Diagnosis Date  . Arachnoiditis   . Hypertension   . Chronic back pain   . Arthritis   . Syncopal episodes   . Heart murmur   . Chronic bronchitis   . HLD (hyperlipidemia)   . Colon polyps   . H/O scarlet fever   . H/O: CVA (cerebrovascular accident)     TIA  . Urine incontinence   . Anxiety     severe with panic attacks    Past Surgical History  Procedure Laterality Date  . Laminectomy  4098,1191    x 4  . Abdominal hysterectomy  1985  . Appendectomy  1963  . Breast biopsy  1966  . Cardiac catheterization  2004  . Lumbar fusion  2004    Family History  Problem Relation Age of Onset  . Arthritis    . CAD Mother     MI  . CAD Father     MI s/p 3v CABG  . Kidney failure Father     ESRD on HD  . Stroke Mother   . Cancer Neg Hx   . Diabetes Neg Hx     History  Substance Use Topics  . Smoking status: Current Every Day Smoker -- 0.25 packs/day    Types: Cigarettes  . Smokeless tobacco: Never Used  . Alcohol Use: No    OB History   Grav Para Term Preterm Abortions TAB SAB Ect Mult Living                  Review of Systems  Constitutional: Negative for fever and chills.  HENT: Negative  for neck pain and neck stiffness.   Eyes: Negative for pain.  Respiratory: Negative for shortness of breath.   Cardiovascular: Positive for chest pain.  Gastrointestinal: Negative for vomiting and abdominal pain.  Genitourinary: Negative for dysuria.  Musculoskeletal: Positive for back pain.  Skin: Negative for rash and wound.  Neurological: Negative for seizures and headaches.  All other systems reviewed and are negative.    Allergies  Penicillins; Sulfa antibiotics; Sertraline hcl; Other; Toradol; Versed; and Codeine  Home Medications   Current Outpatient Rx  Name  Route  Sig  Dispense  Refill  . albuterol (PROVENTIL HFA;VENTOLIN HFA) 108 (90 BASE) MCG/ACT inhaler   Inhalation   Inhale 2 puffs into the lungs every 6 (six) hours as needed for wheezing.   1 Inhaler   2   . ALPRAZolam (XANAX) 1 MG tablet   Oral   Take 2 tablets (2 mg total) by mouth 4 (four) times daily as needed for anxiety. For anxiety   100 tablet   0   . aspirin 81 MG  chewable tablet   Oral   Chew 1 tablet (81 mg total) by mouth daily.         Marland Kitchen gabapentin (NEURONTIN) 300 MG capsule   Oral   Take 4 capsules (1,200 mg total) by mouth at bedtime. Take 4 caps po QHS and take 1 cap at lunchtime   150 capsule   11   . lisinopril-hydrochlorothiazide (PRINZIDE,ZESTORETIC) 10-12.5 MG per tablet   Oral   Take 1 tablet by mouth daily.   30 tablet   3   . methadone (DOLOPHINE) 10 MG tablet   Oral   Take 5 tablets (50 mg total) by mouth every 6 (six) hours as needed for pain.   280 tablet   0   . Multiple Vitamin (MULTIVITAMIN WITH MINERALS) TABS   Oral   Take 1 tablet by mouth daily.           BP 109/62  Temp(Src) 93.6 F (34.2 C) (Rectal)  Resp 20  SpO2 94%  Physical Exam  Constitutional: She is oriented to person, place, and time.  Awake, alert, speaking slowly  HENT:  Head: Normocephalic.  Posterior scalp contusions, no lac or bleeding  Eyes: EOM are normal. Pupils are equal,  round, and reactive to light. No scleral icterus.  Neck: No tracheal deviation present.  No c spine tenderness, c collar in place  Cardiovascular: Intact distal pulses.   Bradycardic, weak distal pulses  Pulmonary/Chest: Effort normal and breath sounds normal. No stridor. No respiratory distress. She exhibits no tenderness.  Abdominal: Soft. Bowel sounds are normal. She exhibits no distension. There is no tenderness. There is no rebound and no guarding.  Musculoskeletal: Normal range of motion. She exhibits no edema.  Neurological: She is alert and oriented to person, place, and time. Coordination normal.  No focal deficits, equal weak distal strengths x 4  Skin: Skin is warm and dry. No rash noted.    ED Course  Procedures (including critical care time)  Results for orders placed during the hospital encounter of 07/26/12  MRSA PCR SCREENING      Result Value Range   MRSA by PCR NEGATIVE  NEGATIVE  CBC      Result Value Range   WBC 6.0  4.0 - 10.5 K/uL   RBC 3.50 (*) 3.87 - 5.11 MIL/uL   Hemoglobin 10.6 (*) 12.0 - 15.0 g/dL   HCT 16.1 (*) 09.6 - 04.5 %   MCV 92.6  78.0 - 100.0 fL   MCH 30.3  26.0 - 34.0 pg   MCHC 32.7  30.0 - 36.0 g/dL   RDW 40.9  81.1 - 91.4 %   Platelets 167  150 - 400 K/uL  URINALYSIS, ROUTINE W REFLEX MICROSCOPIC      Result Value Range   Color, Urine YELLOW  YELLOW   APPearance CLOUDY (*) CLEAR   Specific Gravity, Urine 1.017  1.005 - 1.030   pH 5.0  5.0 - 8.0   Glucose, UA NEGATIVE  NEGATIVE mg/dL   Hgb urine dipstick NEGATIVE  NEGATIVE   Bilirubin Urine NEGATIVE  NEGATIVE   Ketones, ur NEGATIVE  NEGATIVE mg/dL   Protein, ur NEGATIVE  NEGATIVE mg/dL   Urobilinogen, UA 0.2  0.0 - 1.0 mg/dL   Nitrite NEGATIVE  NEGATIVE   Leukocytes, UA MODERATE (*) NEGATIVE  COMPREHENSIVE METABOLIC PANEL      Result Value Range   Sodium 138  135 - 145 mEq/L   Potassium 4.3  3.5 - 5.1 mEq/L  Chloride 106  96 - 112 mEq/L   CO2 21  19 - 32 mEq/L   Glucose, Bld 73   70 - 99 mg/dL   BUN 34 (*) 6 - 23 mg/dL   Creatinine, Ser 4.40 (*) 0.50 - 1.10 mg/dL   Calcium 9.3  8.4 - 10.2 mg/dL   Total Protein 6.7  6.0 - 8.3 g/dL   Albumin 3.2 (*) 3.5 - 5.2 g/dL   AST 20  0 - 37 U/L   ALT 11  0 - 35 U/L   Alkaline Phosphatase 171 (*) 39 - 117 U/L   Total Bilirubin 0.2 (*) 0.3 - 1.2 mg/dL   GFR calc non Af Amer 20 (*) >90 mL/min   GFR calc Af Amer 23 (*) >90 mL/min  PROCALCITONIN      Result Value Range   Procalcitonin <0.10    URINE MICROSCOPIC-ADD ON      Result Value Range   Squamous Epithelial / LPF FEW (*) RARE   WBC, UA 21-50  <3 WBC/hpf   Bacteria, UA RARE  RARE   Casts GRANULAR CAST (*) NEGATIVE  TSH      Result Value Range   TSH 1.246  0.350 - 4.500 uIU/mL  CORTISOL      Result Value Range   Cortisol, Plasma 2.1    URINE RAPID DRUG SCREEN (HOSP PERFORMED)      Result Value Range   Opiates NONE DETECTED  NONE DETECTED   Cocaine NONE DETECTED  NONE DETECTED   Benzodiazepines POSITIVE (*) NONE DETECTED   Amphetamines NONE DETECTED  NONE DETECTED   Tetrahydrocannabinol NONE DETECTED  NONE DETECTED   Barbiturates NONE DETECTED  NONE DETECTED  CBC      Result Value Range   WBC 4.4  4.0 - 10.5 K/uL   RBC 3.10 (*) 3.87 - 5.11 MIL/uL   Hemoglobin 9.4 (*) 12.0 - 15.0 g/dL   HCT 72.5 (*) 36.6 - 44.0 %   MCV 93.9  78.0 - 100.0 fL   MCH 30.3  26.0 - 34.0 pg   MCHC 32.3  30.0 - 36.0 g/dL   RDW 34.7  42.5 - 95.6 %   Platelets 152  150 - 400 K/uL  BASIC METABOLIC PANEL      Result Value Range   Sodium 142  135 - 145 mEq/L   Potassium 4.5  3.5 - 5.1 mEq/L   Chloride 116 (*) 96 - 112 mEq/L   CO2 18 (*) 19 - 32 mEq/L   Glucose, Bld 87  70 - 99 mg/dL   BUN 27 (*) 6 - 23 mg/dL   Creatinine, Ser 3.87 (*) 0.50 - 1.10 mg/dL   Calcium 8.2 (*) 8.4 - 10.5 mg/dL   GFR calc non Af Amer 27 (*) >90 mL/min   GFR calc Af Amer 31 (*) >90 mL/min  MAGNESIUM      Result Value Range   Magnesium 1.6  1.5 - 2.5 mg/dL  PHOSPHORUS      Result Value Range    Phosphorus 4.2  2.3 - 4.6 mg/dL  LIPASE, BLOOD      Result Value Range   Lipase 25  11 - 59 U/L  PROCALCITONIN      Result Value Range   Procalcitonin <0.10    LACTIC ACID, PLASMA      Result Value Range   Lactic Acid, Venous 0.5  0.5 - 2.2 mmol/L  POCT I-STAT, CHEM 8      Result Value Range  Sodium 142  135 - 145 mEq/L   Potassium 4.2  3.5 - 5.1 mEq/L   Chloride 110  96 - 112 mEq/L   BUN 36 (*) 6 - 23 mg/dL   Creatinine, Ser 1.61 (*) 0.50 - 1.10 mg/dL   Glucose, Bld 73  70 - 99 mg/dL   Calcium, Ion 0.96  0.45 - 1.30 mmol/L   TCO2 25  0 - 100 mmol/L   Hemoglobin 10.9 (*) 12.0 - 15.0 g/dL   HCT 40.9 (*) 81.1 - 91.4 %  POCT I-STAT TROPONIN I      Result Value Range   Troponin i, poc 0.01  0.00 - 0.08 ng/mL   Comment 3           CG4 I-STAT (LACTIC ACID)      Result Value Range   Lactic Acid, Venous 0.69  0.5 - 2.2 mmol/L   Ct Head Wo Contrast  07/27/2012  *RADIOLOGY REPORT*  Clinical Data:  Fall, loss of consciousness,  CT HEAD WITHOUT CONTRAST CT CERVICAL SPINE WITHOUT CONTRAST  Technique:  Multidetector CT imaging of the head and cervical spine was performed following the standard protocol without intravenous contrast.  Multiplanar CT image reconstructions of the cervical spine were also generated.  Comparison:  Head CT 04/23/2012  CT HEAD  Findings: No intracranial hemorrhage.  No parenchymal contusion. No midline shift or mass effect.  Basilar cisterns are patent. No skull base fracture.  No fluid in the paranasal sinuses or mastoid air cells.  IMPRESSION: No intracranial trauma.  CT CERVICAL SPINE  Findings: No prevertebral soft tissue swelling.  Normal alignment of cervical vertebral bodies.  No loss of vertebral body height. Normal facet articulation.  Normal craniocervical junction.  No evidence epidural or paraspinal hematoma.  There is mild joint space narrowing and endplate osteophytosis at C5-C6.  IMPRESSION: No evidence of acute cervical spine fracture.  Mild spondylosis.    Original Report Authenticated By: Genevive Bi, M.D.    Ct Cervical Spine Wo Contrast  07/27/2012  *RADIOLOGY REPORT*  Clinical Data:  Fall, loss of consciousness,  CT HEAD WITHOUT CONTRAST CT CERVICAL SPINE WITHOUT CONTRAST  Technique:  Multidetector CT imaging of the head and cervical spine was performed following the standard protocol without intravenous contrast.  Multiplanar CT image reconstructions of the cervical spine were also generated.  Comparison:  Head CT 04/23/2012  CT HEAD  Findings: No intracranial hemorrhage.  No parenchymal contusion. No midline shift or mass effect.  Basilar cisterns are patent. No skull base fracture.  No fluid in the paranasal sinuses or mastoid air cells.  IMPRESSION: No intracranial trauma.  CT CERVICAL SPINE  Findings: No prevertebral soft tissue swelling.  Normal alignment of cervical vertebral bodies.  No loss of vertebral body height. Normal facet articulation.  Normal craniocervical junction.  No evidence epidural or paraspinal hematoma.  There is mild joint space narrowing and endplate osteophytosis at C5-C6.  IMPRESSION: No evidence of acute cervical spine fracture.  Mild spondylosis.   Original Report Authenticated By: Genevive Bi, M.D.    Dg Chest Port 1 View  07/27/2012  *RADIOLOGY REPORT*  Clinical Data: Shortness of breath.  PORTABLE CHEST - 1 VIEW  Comparison: Chest radiograph performed 07/26/2012  Findings: The lungs are well-aerated.  Mild bibasilar opacities raise concern for pneumonia.  Pulmonary vascularity is at the upper limits of normal.  Chronic peribronchial thickening is seen.  No pleural effusion or pneumothorax is identified.  The cardiomediastinal silhouette is borderline enlarged.  No acute  osseous abnormalities are seen.  IMPRESSION:  1.  Mild bibasilar opacities raise concern for pneumonia. 2.  Borderline cardiomegaly.   Original Report Authenticated By: Tonia Ghent, M.D.    Dg Chest Portable 1 View  07/26/2012  *RADIOLOGY REPORT*   Clinical Data: Syncope  PORTABLE CHEST - 1 VIEW  Comparison: Chest radiograph 05/31/2012  Findings: Stable enlarged cardiac silhouette.  No effusion, infiltrate, or pneumothorax.  No aggressive osseous lesions.  IMPRESSION: Cardiomegaly without acute cardiopulmonary findings.   Original Report Authenticated By: Genevive Bi, M.D.        Date: 07/27/2012  Rate: 44  Rhythm: sinus bradycardia  QRS Axis: normal  Intervals: normal  ST/T Wave abnormalities: nonspecific ST changes  Conduction Disutrbances:none  Narrative Interpretation:   Old EKG Reviewed: changes noted previous ECG sinus brady 52  CRITICAL CARE Performed by: Sunnie Nielsen   Total critical care time: 45  Critical care time was exclusive of separately billable procedures and treating other patients.  Critical care was necessary to treat or prevent imminent or life-threatening deterioration.  Critical care was time spent personally by me on the following activities: development of treatment plan with patient and/or surrogate as well as nursing, discussions with consultants, evaluation of patient's response to treatment, examination of patient, obtaining history from patient or surrogate, ordering and performing treatments and interventions, ordering and review of laboratory studies, ordering and review of radiographic studies, pulse oximetry and re-evaluation of patient's condition.  SBP 80s on arrival to ED, core temp 34, warm IVFs initiated and code sepsis called.   12:06 AM after 2L IVFs SBP 79, 3rd bolus ordered.  12:59 AM after 3L IVFs, still hypotensive/ brady - records reviewed baseline bradycardia 50s. New renal insuff, d/w PCCM Dr Deterding recs CAR consult, agrees with start dopamine, PCCM will see in ER. IV ABx for UTI 1:06 AM CAR to evaluate MDM   Hypotension / bradycardia / hypothermia / frequent falls  EKG, labs, imaging  IV fluid resuscitation - after 3 L remains hypotensive  PCCM consult/  admit        Sunnie Nielsen, MD 07/28/12 [phone]

## 2012-07-27 NOTE — ED Notes (Signed)
Over to ct with pt.  Pt remains altered at this time.

## 2012-07-27 NOTE — ED Notes (Signed)
Report given to icu

## 2012-07-27 NOTE — Progress Notes (Addendum)
Bedside critical care medicine followup rounds  - At this point she is awake alert and asking for pain medications. - Her blood pressure has improved and she is off pressors; sbp > 90 since 8.30am  - Hypothermia persists but her exam is nonfocal  - Repeat labs - Magnesium 1.6, phosphorus 4.2, lipase 25, lactic acid 0.5 and pro-calcitonin is pending. - Cortisol is extremely low at 2.6  - Only abnormalities hypomagnesemia and low cortisol  Plan - Replete magnesium - Start hydrocortisone - We'll transfer patient to step down /telemetry depending on blood pressure response to hydrocortisone; [facility] to assume primary service 07/28/2012 -Restart her pain medication methadone 15 mg 3 times a day which is what she takes at home 50 mg total per day. I restarted her Xanax to 4 times a day dose    Dr. Kalman Shan, M.D., [facility].C.P Pulmonary and Critical Care Medicine Staff Physician [facility] [place] [facility] Pager: [phone], If no answer or between  15:00h - 7:00h: call [phone]  07/27/2012 11:34 AM

## 2012-07-27 NOTE — ED Notes (Signed)
Back from ct.  Pt bp remains 72/48.  Family at bedside.

## 2012-07-28 LAB — BASIC METABOLIC PANEL
CO2: 18 mEq/L — ABNORMAL LOW (ref 19–32)
Calcium: 9.4 mg/dL (ref 8.4–10.5)
Creatinine, Ser: 1.66 mg/dL — ABNORMAL HIGH (ref 0.50–1.10)
GFR calc non Af Amer: 31 mL/min — ABNORMAL LOW (ref >90)
Sodium: 143 mEq/L (ref 135–145)

## 2012-07-28 LAB — PROCALCITONIN: Procalcitonin: <0.1 ng/mL

## 2012-07-28 LAB — MAGNESIUM: Magnesium: 2.2 mg/dL (ref 1.5–2.5)

## 2012-07-28 LAB — PHOSPHORUS: Phosphorus: 4.7 mg/dL — ABNORMAL HIGH (ref 2.3–4.6)

## 2012-07-28 MED ORDER — ALPRAZOLAM 0.5 MG PO TABS
0.5000 mg | ORAL_TABLET | Freq: Four times a day (QID) | ORAL | Status: DC
  Administered 2012-07-28 – 2012-07-29 (×2): 0.5 mg via ORAL
  Filled 2012-07-28 (×3): qty 1

## 2012-07-28 MED ORDER — METHADONE HCL 5 MG PO TABS
5.0000 mg | ORAL_TABLET | Freq: Three times a day (TID) | ORAL | Status: DC
  Administered 2012-07-28 – 2012-08-02 (×15): 5 mg via ORAL
  Filled 2012-07-28 (×16): qty 1

## 2012-07-28 MED ORDER — VANCOMYCIN HCL 1000 MG IV SOLR
750.0000 mg | INTRAVENOUS | Status: DC
  Administered 2012-07-28 – 2012-08-01 (×5): 750 mg via INTRAVENOUS
  Filled 2012-07-28 (×5): qty 750

## 2012-07-28 NOTE — Progress Notes (Signed)
Pt somnulent, nodding off, speech slurred & slow, arouses to voice & is oriented x4 when awakened.  Methadone bottle found open in purse with several tabs loose in purse; pt denies opening bottle or taking any pills from it.  Methadone bottle & all loose tabs removed from purse, counted with 2nd RN, given to [facility] for storage in Pharmacy vault.  REceipt for medications sign by patient, copy in pt's shadow chart.

## 2012-07-28 NOTE — Clinical Social Work Psychosocial (Signed)
     Clinical Social Work Department BRIEF PSYCHOSOCIAL ASSESSMENT 07/28/2012  Patient:  [name] [name], [name] [name]     Account Number:  [id number]     Admit date:  07/26/2012  Clinical Social Worker:  Verl Blalock  Date/Time:  07/27/2012 04:30 PM  Referred by:  RN  Date Referred:  07/27/2012 Referred for  Substance Abuse   Other Referral:   Patient with questionable accidental overdose   Interview type:  Patient Other interview type:   No family currently present at bedside    PSYCHOSOCIAL DATA Living Status:  FAMILY Admitted from facility:   Level of care:   Primary support name:  [name] [name],[name] [name]   [phone]    [phone] Primary support relationship to patient:  SPOUSE Degree of support available:   Fair    CURRENT CONCERNS Current Concerns  Other - See comment   Other Concerns:   Patient not willing to further explore alternative options    SOCIAL WORK ASSESSMENT / PLAN Clinical Social Worker met with patient at bedside to offer support and discuss patient needs at discharge.  Patient states that she lives at home with her husband and her first cousin in a mobile home.  Patient feels that she is on a good pain medication regimin at this time and has no desire to participate in any detox or further programs to make changes.  Patient feels she has good support at home and plans to return home with family at discharge.    Clinical Social Worker inquired further about patient additional substance use.  Patient states that she does not drink any alcohol or use recreational drugs but when she has break through pain she uses additional medications of her own or her husbands.  Patient understands that this is inappropriate and needs to further address with her PCP or MD providing refill medications.  Patient with no desire for change and has refused all resources provided through CSW.  CSW signing off at this time.  Please reconsult if social work needs arise during patient  hospitalization.   Assessment/plan status:  No Further Intervention Required Other assessment/ plan:   Information/referral to community resources:   Patient refusing all resources at this time in a paranoid state, saying "I have everything I need."    PATIENTS/FAMILYS RESPONSE TO PLAN OF CARE: Patient alert and oriented x3 laying in the bed complaining of pain.  During CSW assessment patient was engaged and was able to focus on other areas outside of pain and medications briefly.  Patient with long standing pain control issues and actively working with MD to work towards pain clinic regimin.  Patient expressed her appreciation for CSW concern and kindly stated "I don't need your help."

## 2012-07-28 NOTE — Progress Notes (Signed)
ANTIBIOTIC CONSULT NOTE - INITIAL  Pharmacy Consult for Vancomycin Indication: Enterococcal UTI  Allergies  Allergen Reactions  . Penicillins Anaphylaxis  . Sulfa Antibiotics Anaphylaxis  . Sertraline Hcl Other (See Comments)    Caused pt to be hyper and confussed  . Other     ALL ANTI DEPRESSANTS; MUSHROOMS  . Toradol (Ketorolac Tromethamine) Nausea And Vomiting  . Versed (Midazolam)     DID NOT WORK  . Codeine Itching and Rash    Patient Measurements: Height: 5' 3.5" (161.3 cm) Weight: 154 lb 8.7 oz (70.1 kg) IBW/kg (Calculated) : 53.55  Vital Signs: BP: 110/72 mmHg (03/07 1400) Pulse Rate: 54 (03/07 1500) Intake/Output from previous day: 03/06 0701 - 03/07 0700 In: 1500 [I.V.:1500] Out: 103 [Urine:103] Intake/Output from this shift: Total I/O In: 2050 [P.O.:300; I.V.:1750] Out: 1 [Urine:1]  Labs:  Recent Labs  07/26/12 2334  07/26/12 2347 07/27/12 0005 07/27/12 0544 07/28/12 0730  WBC 6.0  --   --   --  4.4  --   HGB 10.6*  --  10.9*  --  9.4*  --   PLT 167  --   --   --  152  --   CREATININE  --   < > 2.20* 2.43* 1.88* 1.66*  < > = values in this interval not displayed. Estimated Creatinine Clearance: 32.1 ml/min (by C-G formula based on Cr of 1.66). No results found for this basename: VANCOTROUGH, VANCOPEAK, VANCORANDOM, GENTTROUGH, GENTPEAK, GENTRANDOM, TOBRATROUGH, TOBRAPEAK, TOBRARND, AMIKACINPEAK, AMIKACINTROU, AMIKACIN,  in the last 72 hours   Microbiology: Recent Results (from the past 720 hour(s))  URINE CULTURE     Status: None   Collection Time    07/27/12 12:07 AM      Result Value Range Status   Specimen Description URINE, RANDOM   Final   Special Requests NONE   Final   Culture  Setup Time 07/27/2012 09:47   Final   Colony Count >=100,000 COLONIES/ML   Final   Culture ENTEROCOCCUS SPECIES   Final   Report Status PENDING   Incomplete  CULTURE, BLOOD (ROUTINE X 2)     Status: None   Collection Time    07/27/12 12:20 AM      Result  Value Range Status   Specimen Description BLOOD LEFT HAND   Final   Special Requests BOTTLES DRAWN AEROBIC AND ANAEROBIC 10CC EACH   Final   Culture  Setup Time 07/27/2012 09:44   Final   Culture     Final   Value:        BLOOD CULTURE RECEIVED NO GROWTH TO DATE CULTURE WILL BE HELD FOR 5 DAYS BEFORE ISSUING A FINAL NEGATIVE REPORT   Report Status PENDING   Incomplete  CULTURE, BLOOD (ROUTINE X 2)     Status: None   Collection Time    07/27/12 12:25 AM      Result Value Range Status   Specimen Description BLOOD RIGHT HAND   Final   Special Requests BOTTLES DRAWN AEROBIC ONLY 5CC   Final   Culture  Setup Time 07/27/2012 09:44   Final   Culture     Final   Value:        BLOOD CULTURE RECEIVED NO GROWTH TO DATE CULTURE WILL BE HELD FOR 5 DAYS BEFORE ISSUING A FINAL NEGATIVE REPORT   Report Status PENDING   Incomplete  MRSA PCR SCREENING     Status: None   Collection Time    07/27/12  4:20 AM  Result Value Range Status   MRSA by PCR NEGATIVE  NEGATIVE Final   Comment:            The GeneXpert MRSA Assay (FDA     approved for NASAL specimens     only), is one component of a     comprehensive MRSA colonization     surveillance program. It is not     intended to diagnose MRSA     infection nor to guide or     monitor treatment for     MRSA infections.    Medical History: Past Medical History  Diagnosis Date  . Arachnoiditis   . Hypertension   . Chronic back pain   . Arthritis   . Syncopal episodes   . Heart murmur   . Chronic bronchitis   . HLD (hyperlipidemia)   . Colon polyps   . H/O scarlet fever   . H/O: CVA (cerebrovascular accident)     TIA  . Urine incontinence   . Anxiety     severe with panic attacks    Medications:  Scheduled:  . ALPRAZolam  0.5 mg Oral QID  . aspirin  81 mg Oral Daily  . heparin  5,000 Units Subcutaneous Q8H  . hydrocortisone sodium succinate  50 mg Intravenous Q6H  . methadone  5 mg Oral Q8H  . multivitamin with minerals  1 tablet  Oral Daily  . [DISCONTINUED] ALPRAZolam  2 mg Oral QID  . [DISCONTINUED] ciprofloxacin  400 mg Intravenous Q24H  . [DISCONTINUED] methadone  15 mg Oral Q8H   Assessment: 67 yo female known to pharmacy from cipro dosing. Pt now with urine culture growing 100kcol enterococcus. Estimated CrCl ~ 30 mL/min.   Plan:  1. Vancomycin 750mg  IV q24h. 2. Will f/u culture sensitivities, renal function, trough at [facility] if continues  Christoper Fabian, PharmD, BCPS Clinical pharmacist, pager [phone]  07/28/2012,5:18 PM

## 2012-07-28 NOTE — Progress Notes (Signed)
Pt somnulent & nodding off but awakens to voice, oriented.  Pt keeps her purse with her in bed "in case I need to comb my hair".  This RN opened purse & found four empty Xanax prescription bottles and 1 ~ 2/3 full methadone prescription bottle.  Pt states that she has not taken and will not take any meds from her supply.  Offered to sign meds in to pharmacy safe but pt refused this.  She suggested that when her husband comes later we can send meds home with him.

## 2012-07-28 NOTE — Progress Notes (Signed)
Pt noted to be kneeling beside foot of bed.  States she did not fall, she was praying & wanted to be alone to pray.  3 siderails up, call button within reach.  Pt assisted back into bed w/o difficulty, no visible injuries & pt states no pain.

## 2012-07-28 NOTE — Progress Notes (Signed)
[facility] Progress Note [place] TEAM 1 - Stepdown/ICU TEAM   RONIKA KELSON BMW:[id number] DOB: 02-03-1946 DOA: 07/26/2012 PCP: [name], MD  Brief narrative: 67 y/o female with chronic pain and hypertension who presented to the [facility] ED on 3/5 after multiple falls at home and confusion. PCCM was consulted due to hypotension and bradycardia.  She stated that for several days she had been feeling more weak with poor appetite,nausea and vomiting, and some diarrhea. She noted some pain in her hip and back which was slightly more than her chronic pain in these areas. Her husband stated that for the two days she had been more confused, weak, and had fallen several times. He stated that she hit her head on several occasions. She refused to come to the ED despite his prompting until she lost consciousness for a few seconds after a fall late in the evening on 3/5. She denied fevers, cough, or dyspnea. In the ED she received 4L normal saline and a warming blanket. She denied drug overdose, taking meds that were not hers, alcohol use, or prolonged exposure outside.  Assessment/Plan:  Shock/hypotension w/ bradycardia - adrenal insufficiency - hypothermia Cortisol very low at 2.6 - steroid replacement begun per PCCM - will need to wean to maintenance doses when patient is more stable  Confusion/encephalopathy It appears that the patient has also been taking extra doses of methadone from her own personal supply even during her hospital stay - please see nursing notes - at the time of my evaluation the patient is too lethargic to allow me to discuss this with her - the patient's personal medications have been confiscated and placed in safe keeping - clearly the patient's hypotension is also exacerbated by narcotic abuse/overdose - I have no choice but to decrease the amount of her methadone dosing -we will follow her closely   Chronic 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  Enterococcus UTI  Blood cultures are negative and clinically there are 2 other clear etiologies to her hypotension (narcotic overdose plus adrenal insufficiency) - I will treat empirically with vancomycin and the patient is penicillin allergic  Code Status: Full  Family Communication: No family present  Disposition Plan: Step down unit  Consultants: Critical care medicine  Procedures: none  Antibiotics: Ciprofloxacin 3/6>>3/7 Vancomycin 3/7>>  DVT prophylaxis: Subcutaneous heparin  HPI/Subjective: At the time of my evaluation the patient is markedly lethargic.  She will open her eyes to voice but her speech is slurred and she is unable to answer questions.  Her pupils are pinpoint bilaterally and equal.  Her respiratory rate is suppressed though her O2 saturations are at 90% or greater.  Objective: Blood pressure 88/52, pulse 40, temperature 97.4 F (36.3 C), temperature source Axillary, resp. rate 25, height 5' 3.5" (1.613 m), weight 70.1 kg (154 lb 8.7 oz), SpO2 91.00%.  Intake/Output Summary (Last 24 hours) at 07/28/12 1218 Last data filed at 07/28/12 1200  Gross per 24 hour  Intake   3150 ml  Output      4 ml  Net   3146 ml   Exam: General: No acute  respiratory distress but respiratory rate is suppressed  Lungs: Clear to auscultation bilaterally without wheezes or crackles but with poor air movement due to low respiratory rate  Cardiovascular: Bradycardic but regular without appreciable murmur  Abdomen: Nontender, nondistended, soft, bowel sounds positive, no rebound, no ascites, no appreciable mass Extremities: No significant cyanosis, clubbing, or  edema bilateral lower extremities  Data Reviewed: Basic Metabolic Panel:  Recent Labs Lab 07/26/12 2347 07/27/12 0005 07/27/12 0544 07/27/12 1015 07/28/12 0530 07/28/12 0730  NA 142 138 142  --   --  143  K 4.2 4.3 4.5  --   --  4.3  CL 110 106 116*  --   --  113*  CO2  --  21 18*  --   --  18*  GLUCOSE 73 73 87  --   --  115*  BUN 36* 34* 27*  --   --  26*  CREATININE 2.20* 2.43* 1.88*  --   --  1.66*  CALCIUM  --  9.3 8.2*  --   --  9.4  MG  --   --   --  1.6 2.2  --   PHOS  --   --   --  4.2 4.7*  --    Liver Function Tests:  Recent Labs Lab 07/27/12 0005  AST 20  ALT 11  ALKPHOS 171*  BILITOT 0.2*  PROT 6.7  ALBUMIN 3.2*    Recent Labs Lab 07/27/12 1015  LIPASE 25   CBC:  Recent Labs Lab 07/26/12 2334 07/26/12 2347 07/27/12 0544  WBC 6.0  --  4.4  HGB 10.6* 10.9* 9.4*  HCT 32.4* 32.0* 29.1*  MCV 92.6  --  93.9  PLT 167  --  152    Recent Results (from the past 240 hour(s))  URINE CULTURE     Status: None   Collection Time    07/27/12 12:07 AM      Result Value Range Status   Specimen Description URINE, RANDOM   Final   Special Requests NONE   Final   Culture  Setup Time 07/27/2012 09:47   Final   Colony Count >=100,000 COLONIES/ML   Final   Culture ENTEROCOCCUS SPECIES   Final   Report Status PENDING   Incomplete  CULTURE, BLOOD (ROUTINE X 2)     Status: None   Collection Time    07/27/12 12:20 AM      Result Value Range Status   Specimen Description BLOOD LEFT HAND   Final   Special Requests BOTTLES DRAWN AEROBIC AND ANAEROBIC 10CC EACH   Final   Culture  Setup Time 07/27/2012 09:44   Final   Culture     Final   Value:        BLOOD CULTURE RECEIVED NO GROWTH TO DATE CULTURE WILL BE HELD FOR 5 DAYS BEFORE ISSUING A FINAL NEGATIVE REPORT   Report Status PENDING   Incomplete  CULTURE, BLOOD (ROUTINE X 2)     Status: None   Collection Time    07/27/12 12:25 AM      Result Value Range Status   Specimen Description BLOOD RIGHT HAND    Final   Special Requests BOTTLES DRAWN AEROBIC ONLY 5CC   Final   Culture  Setup Time 07/27/2012 09:44   Final   Culture     Final   Value:        BLOOD CULTURE RECEIVED NO GROWTH TO DATE CULTURE WILL BE HELD FOR 5 DAYS BEFORE  ISSUING A FINAL NEGATIVE REPORT   Report Status PENDING   Incomplete  MRSA PCR SCREENING     Status: None   Collection Time    07/27/12  4:20 AM      Result Value Range Status   MRSA by PCR NEGATIVE  NEGATIVE Final   Comment:            The GeneXpert MRSA Assay (FDA     approved for NASAL specimens     only), is one component of a     comprehensive MRSA colonization     surveillance program. It is not     intended to diagnose MRSA     infection nor to guide or     monitor treatment for     MRSA infections.     Studies:  Recent x-ray studies have been reviewed in detail by the Attending Physician  Scheduled Meds:  Scheduled Meds: . ALPRAZolam  2 mg Oral QID  . aspirin  81 mg Oral Daily  . ciprofloxacin  400 mg Intravenous Q24H  . heparin  5,000 Units Subcutaneous Q8H  . hydrocortisone sodium succinate  50 mg Intravenous Q6H  . methadone  15 mg Oral Q8H  . multivitamin with minerals  1 tablet Oral Daily   Continuous Infusions: . sodium chloride 1,000 mL (07/28/12 0000)    Time spent on care of this patient: 77   [facility] T  [facility] Office  [phone] Pager - Text Page per Loretha Stapler as per below:  On-Call/Text Page:      [email or web address]      password [email or web address]  If 7PM-7AM, please contact night-coverage [email or web address] Password [email or web address] 07/28/2012, 12:18 PM   LOS: 2 days

## 2012-07-29 ENCOUNTER — Inpatient Hospital Stay (HOSPITAL_COMMUNITY): Payer: Medicare Other

## 2012-07-29 DIAGNOSIS — F411 Generalized anxiety disorder: Secondary | ICD-10-CM

## 2012-07-29 LAB — BASIC METABOLIC PANEL
BUN: 25 mg/dL — ABNORMAL HIGH (ref 6–23)
Calcium: 9.4 mg/dL (ref 8.4–10.5)
Chloride: 117 mEq/L — ABNORMAL HIGH (ref 96–112)
Creatinine, Ser: 1.66 mg/dL — ABNORMAL HIGH (ref 0.50–1.10)
GFR calc Af Amer: 36 mL/min — ABNORMAL LOW (ref >90)

## 2012-07-29 LAB — URINE CULTURE: Colony Count: >=100000

## 2012-07-29 LAB — MAGNESIUM: Magnesium: 2 mg/dL (ref 1.5–2.5)

## 2012-07-29 MED ORDER — ALPRAZOLAM 0.5 MG PO TABS: 0.5000 mg | ORAL_TABLET | Freq: Every evening | ORAL | Status: DC | PRN

## 2012-07-29 MED ORDER — ONDANSETRON HCL 4 MG/2ML IJ SOLN
4.0000 mg | INTRAMUSCULAR | Status: DC | PRN
  Administered 2012-07-30 – 2012-07-31 (×2): 4 mg via INTRAVENOUS
  Filled 2012-07-29 (×2): qty 2

## 2012-07-29 MED ORDER — ALPRAZOLAM 0.5 MG PO TABS
0.5000 mg | ORAL_TABLET | Freq: Four times a day (QID) | ORAL | Status: DC | PRN
  Administered 2012-07-29: 0.5 mg via ORAL
  Administered 2012-07-29: 1 mg via ORAL
  Administered 2012-07-30 – 2012-07-31 (×3): 0.5 mg via ORAL
  Administered 2012-07-31 – 2012-08-02 (×8): 1 mg via ORAL
  Filled 2012-07-29: qty 1
  Filled 2012-07-29 (×2): qty 2
  Filled 2012-07-29: qty 1
  Filled 2012-07-29: qty 2
  Filled 2012-07-29 (×2): qty 1
  Filled 2012-07-29 (×6): qty 2

## 2012-07-29 MED ORDER — SODIUM CHLORIDE 0.9 % IV SOLN: 1000.0000 mL | INTRAVENOUS | Status: DC

## 2012-07-29 MED ORDER — HYDROCORTISONE SOD SUCCINATE 100 MG IJ SOLR
50.0000 mg | Freq: Three times a day (TID) | INTRAMUSCULAR | Status: DC
  Administered 2012-07-29 – 2012-07-31 (×6): 50 mg via INTRAVENOUS
  Filled 2012-07-29 (×8): qty 1

## 2012-07-29 NOTE — Progress Notes (Signed)
[facility] Progress Note [place] TEAM 1 - Stepdown/ICU TEAM   [name] [name] WGN:[id number] DOB: 1946/03/20 DOA: 07/26/2012 PCP: [name], MD  Brief narrative: 67 y/o female with chronic pain and hypertension who presented to the [facility] ED on 3/5 after multiple falls at home and confusion. PCCM was consulted due to hypotension and bradycardia.  She stated that for several days she had been feeling more weak with poor appetite,nausea and vomiting, and some diarrhea. She noted some pain in her hip and back which was slightly more than her chronic pain in these areas. Her husband stated that for two days she had been more confused, weak, and had fallen several times. He stated that she hit her head on several occasions. She refused to come to the ED despite his prompting until she lost consciousness for a few seconds after a fall late in the evening on 3/5. She denied fevers, cough, or dyspnea. In the ED she received 4L normal saline and a warming blanket. She denied drug overdose, taking meds that were not hers, alcohol use, or prolonged exposure outside.  After her admission a serum cortisol was found to be markedly low depressed and and stress dose steroids were initiated.  The patient was also aggressively volume resuscitated.  The morning following her admission she was felt to be improving and her care was transitioned to [facility] team.  Assessment/Plan:  Shock / hypotension w/ bradycardia - adrenal insufficiency - hypothermia Cortisol very low at 2.6 - steroid replacement begun per PCCM - will need to wean to maintenance doses when patient is more stable - check MRI of brain to rule out pituitary lesion - will eventually need ACTH stim test once patient is stable enough to temporarily discontinue steroids   Confusion / encephalopathy It appears that the patient  was taking extra doses of methadone from her own personal supply even during her hospital stay - please see  nursing notes - the patient's personal medications were confiscated and placed in safe keeping -  the patient's husband was unhappy about this fact but I explained to him the serious nature of her hypotension and the fact that additional narcotic doses could lead to her death - clearly the patient's hypotension is exacerbated by narcotic abuse/overdose - I had no choice but to decrease the amount of her reported home methadone dose -in that the patient was more alert today I was able to explain to her and her husband the rationale for the lower methadone dose - I explained to her that I will likely not be able to adequately or completely control her pain at this time due to the complication of hypotension - it should be noted that this patient is not followed in a pain clinic and recently obtained her methadone from a new primary care physician she has only seen one time - it is not clear to me if the same physician intends to continue dosing methadone -  overall the patient's mental status is much improved today though she does remain somewhat lethargic even on a significantly lower dosages of methadone and benzodiazepine    Chronic pain on high dose methadone See discussion above - it appears clear to me that this patient is abusing her narcotics   COPD No evidence of acute exacerbation   Acute kidney injury Due to poor perfusion related to hypotension - improving   Chronic anemia No evidence of acute blood loss  - follow trend   Hypomagnesemia Likely due  to poor nutritional status - stable   Enterococcus UTI  Blood cultures are negative and clinically there are 2 other clear etiologies to her hypotension (narcotic overdose plus adrenal insufficiency) - I will treat empirically with vancomycin as the patient is penicillin allergic - of note, procalcitonin has been consistently <10  Code Status: Full  Family Communication: No family present  Disposition Plan: stable for transfer to telemetry  bed   Consultants: Critical care medicine  Procedures: none  Antibiotics: Ciprofloxacin 3/6>>3/7 Vancomycin 3/7>>  DVT prophylaxis: Subcutaneous heparin  HPI/Subjective: The patient is much more alert today.  She asks that her methadone dose be increased to her reported home dose.  She also asked that her benzodiazepine dose be increased to her home dose.  She denies chest pain nausea vomiting shortness of breath.  She does remain lethargic with heavy eyelids during our conversation though she is much more alert than yesterday.    Objective: Blood pressure 105/52, pulse 50, temperature 94 F (34.4 C), temperature source Oral, resp. rate 13, height 5' 3.5" (1.613 m), weight 72 kg (158 lb 11.7 oz), SpO2 93.00%.  Intake/Output Summary (Last 24 hours) at 07/29/12 1140 Last data filed at 07/29/12 1100  Gross per 24 hour  Intake 3656.25 ml  Output      0 ml  Net 3656.25 ml   Exam: General: No acute respiratory distress but respiratory rate is suppressed  Lungs: Clear to auscultation bilaterally without wheezes or crackles Cardiovascular: Bradycardic but regular without appreciable murmur  Abdomen: Nontender, nondistended, soft, bowel sounds positive, no rebound, no ascites, no appreciable mass Extremities: No significant cyanosis, clubbing, or edema bilateral lower extremities  Data Reviewed: Basic Metabolic Panel:  Recent Labs Lab 07/26/12 2347 07/27/12 0005 07/27/12 0544 07/27/12 1015 07/28/12 0530 07/28/12 0730 07/29/12 0450  NA 142 138 142  --   --  143 143  K 4.2 4.3 4.5  --   --  4.3 4.6  CL 110 106 116*  --   --  113* 117*  CO2  --  21 18*  --   --  18* 21  GLUCOSE 73 73 87  --   --  115* 98  BUN 36* 34* 27*  --   --  26* 25*  CREATININE 2.20* 2.43* 1.88*  --   --  1.66* 1.66*  CALCIUM  --  9.3 8.2*  --   --  9.4 9.4  MG  --   --   --  1.6 2.2  --  2.0  PHOS  --   --   --  4.2 4.7*  --  4.1   Liver Function Tests:  Recent Labs Lab 07/27/12 0005  AST 20   ALT 11  ALKPHOS 171*  BILITOT 0.2*  PROT 6.7  ALBUMIN 3.2*    Recent Labs Lab 07/27/12 1015  LIPASE 25   CBC:  Recent Labs Lab 07/26/12 2334 07/26/12 2347 07/27/12 0544  WBC 6.0  --  4.4  HGB 10.6* 10.9* 9.4*  HCT 32.4* 32.0* 29.1*  MCV 92.6  --  93.9  PLT 167  --  152    Recent Results (from the past 240 hour(s))  URINE CULTURE     Status: None   Collection Time    07/27/12 12:07 AM      Result Value Range Status   Specimen Description URINE, RANDOM   Final   Special Requests NONE   Final   Culture  Setup Time 07/27/2012 09:47  Final   Colony Count >=100,000 COLONIES/ML   Final   Culture ENTEROCOCCUS SPECIES   Final   Report Status PENDING   Incomplete  CULTURE, BLOOD (ROUTINE X 2)     Status: None   Collection Time    07/27/12 12:20 AM      Result Value Range Status   Specimen Description BLOOD LEFT HAND   Final   Special Requests BOTTLES DRAWN AEROBIC AND ANAEROBIC 10CC EACH   Final   Culture  Setup Time 07/27/2012 09:44   Final   Culture     Final   Value:        BLOOD CULTURE RECEIVED NO GROWTH TO DATE CULTURE WILL BE HELD FOR 5 DAYS BEFORE ISSUING A FINAL NEGATIVE REPORT   Report Status PENDING   Incomplete  CULTURE, BLOOD (ROUTINE X 2)     Status: None   Collection Time    07/27/12 12:25 AM      Result Value Range Status   Specimen Description BLOOD RIGHT HAND   Final   Special Requests BOTTLES DRAWN AEROBIC ONLY 5CC   Final   Culture  Setup Time 07/27/2012 09:44   Final   Culture     Final   Value:        BLOOD CULTURE RECEIVED NO GROWTH TO DATE CULTURE WILL BE HELD FOR 5 DAYS BEFORE ISSUING A FINAL NEGATIVE REPORT   Report Status PENDING   Incomplete  MRSA PCR SCREENING     Status: None   Collection Time    07/27/12  4:20 AM      Result Value Range Status   MRSA by PCR NEGATIVE  NEGATIVE Final   Comment:            The GeneXpert MRSA Assay (FDA     approved for NASAL specimens     only), is one component of a     comprehensive MRSA  colonization     surveillance program. It is not     intended to diagnose MRSA     infection nor to guide or     monitor treatment for     MRSA infections.     Studies:  Recent x-ray studies have been reviewed in detail by the Attending Physician  Scheduled Meds:  Scheduled Meds: . ALPRAZolam  0.5 mg Oral QID  . aspirin  81 mg Oral Daily  . heparin  5,000 Units Subcutaneous Q8H  . hydrocortisone sodium succinate  50 mg Intravenous Q6H  . methadone  5 mg Oral Q8H  . multivitamin with minerals  1 tablet Oral Daily  . vancomycin  750 mg Intravenous Q24H   Continuous Infusions: . sodium chloride 1,000 mL (07/29/12 1138)    Time spent on care of this patient: 31   [facility] T  [facility] Office  [phone] Pager - Text Page per Loretha Stapler as per below:  On-Call/Text Page:      [email or web address]      password [email or web address]  If 7PM-7AM, please contact night-coverage [email or web address] Password [email or web address] 07/29/2012, 11:40 AM   LOS: 3 days

## 2012-07-30 ENCOUNTER — Inpatient Hospital Stay (HOSPITAL_COMMUNITY): Payer: Medicare Other

## 2012-07-30 DIAGNOSIS — I10 Essential (primary) hypertension: Secondary | ICD-10-CM

## 2012-07-30 DIAGNOSIS — E785 Hyperlipidemia, unspecified: Secondary | ICD-10-CM

## 2012-07-30 LAB — CBC
MCH: 30.4 pg (ref 26.0–34.0)
MCV: 95.8 fL (ref 78.0–100.0)
Platelets: 141 10*3/uL — ABNORMAL LOW (ref 150–400)
RDW: 15.3 % (ref 11.5–15.5)
WBC: 8.5 10*3/uL (ref 4.0–10.5)

## 2012-07-30 LAB — BASIC METABOLIC PANEL
CO2: 20 mEq/L (ref 19–32)
Calcium: 9.7 mg/dL (ref 8.4–10.5)
Creatinine, Ser: 1.66 mg/dL — ABNORMAL HIGH (ref 0.50–1.10)
GFR calc non Af Amer: 31 mL/min — ABNORMAL LOW (ref >90)
Glucose, Bld: 86 mg/dL (ref 70–99)

## 2012-07-30 MED ORDER — GADOBENATE DIMEGLUMINE 529 MG/ML IV SOLN
8.0000 mL | Freq: Once | INTRAVENOUS | Status: AC | PRN
  Administered 2012-07-30: 8 mL via INTRAVENOUS

## 2012-07-30 NOTE — Plan of Care (Signed)
Problem: Phase I Progression Outcomes Goal: OOB as tolerated unless otherwise ordered Outcome: Progressing oob up to [facility]

## 2012-07-30 NOTE — Progress Notes (Addendum)
Pt. running low grade temperatures @ 93 and 94 degrees farenheit. Dr. On call notified and made aware prior to transfer from 3100. Patient's other vitals stable and in normal range. Dr explained that low grade temperatures were due to a low cortisol level, and patient would be fine to transfer to [facility]. Will continue to monitor patient.

## 2012-07-30 NOTE — Progress Notes (Signed)
[facility] PROGRESS NOTE  [name] [name] ZOX:[id number] DOB: 1946-04-24 DOA: 07/26/2012 PCP: [name], MD From last progress note:  Brief narrative:  67 y/o female with chronic pain and hypertension who presented to the [facility] ED on 3/5 after multiple falls at home and confusion. PCCM was consulted due to hypotension and bradycardia. She stated that for several days she had been feeling more weak with poor appetite,nausea and vomiting, and some diarrhea. She noted some pain in her hip and back which was slightly more than her chronic pain in these areas. Her husband stated that for two days she had been more confused, weak, and had fallen several times. He stated that she hit her head on several occasions. She refused to come to the ED despite his prompting until she lost consciousness for a few seconds after a fall late in the evening on 3/5. She denied fevers, cough, or dyspnea. In the ED she received 4L normal saline and a warming blanket. She denied drug overdose, taking meds that were not hers, alcohol use, or prolonged exposure outside.  After her admission a serum cortisol was found to be markedly low depressed and and stress dose steroids were initiated. The patient was also aggressively volume resuscitated. The morning following her admission she was felt to be improving and her care was transitioned to [facility] team.   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 - will eventually need ACTH stim test once patient is stable enough to temporarily discontinue steroids   Confusion / encephalopathy  Multifactorial and most likely opiod and benzodiazepine medication contributing.  They have been  reduced and patient is able to carry conversation with me today.   - continue steroids - reassess next am.  Chronic pain on high dose methadone  - methadone has been decreased due to somnolence  COPD  Stable   Acute kidney injury  Most likely due to hypotension.  Creatinine steady.  Will continue to monitor.  Chronic anemia  No evidence of acute blood loss - follow trend   Hypomagnesemia  Likely due to poor nutritional status - stable with last mg level at 2.0  Enterococcus UTI  Blood cultures are negative and clinically there are 2 other clear etiologies to her hypotension (narcotic overdose plus adrenal insufficiency) - I will treat empirically with vancomycin as the patient is penicillin allergic - of note, procalcitonin has been consistently <10 and lactic acid level has been 0.5  Code Status: Full  Family Communication: No family present  Disposition Plan: stable for transfer to telemetry bed    Consultants:  none  Procedures:  none  Antibiotics:  Vancomycin  HPI/Subjective: No acute issues overnight reported to me. No new complaints at this juncture.  Objective: Filed Vitals:   07/30/12 0523 07/30/12 0606 07/30/12 1407 07/30/12 1559  BP: 129/64  175/100 181/114  Pulse: 73  93 71  Temp: 94.6 F (34.8 C) 97.3 F (36.3 C) 98.1 F (36.7 C) 98 F (36.7 C)  TempSrc: Oral Oral Oral Oral  Resp: 18  18 20   Height:      Weight: 74.2 kg (163 lb 9.3 oz)     SpO2: 97%  92% 95%  Intake/Output Summary (Last 24 hours) at 07/30/12 1705 Last data filed at 07/30/12 0917  Gross per 24 hour  Intake    176 ml  Output      0 ml  Net    176 ml   Filed Weights   07/29/12 0500 07/29/12 2130 07/30/12 0523  Weight: 72 kg (158 lb 11.7 oz) 74.208 kg (163 lb 9.6 oz) 74.2 kg (163 lb 9.3 oz)    Exam:   General:  Pt in NAD, Alert and Awake  Cardiovascular: RRR, No MRG  Respiratory: CTA BL, no wheezes  Abdomen: soft, Nt, ND,   Musculoskeletal: no cyanosis or  clubbing   Data Reviewed: Basic Metabolic Panel:  Recent Labs Lab 07/27/12 0005 07/27/12 0544 07/27/12 1015 07/28/12 0530 07/28/12 0730 07/29/12 0450 07/30/12 1405  NA 138 142  --   --  143 143 143  K 4.3 4.5  --   --  4.3 4.6 4.4  CL 106 116*  --   --  113* 117* 115*  CO2 21 18*  --   --  18* 21 20  GLUCOSE 73 87  --   --  115* 98 86  BUN 34* 27*  --   --  26* 25* 24*  CREATININE 2.43* 1.88*  --   --  1.66* 1.66* 1.66*  CALCIUM 9.3 8.2*  --   --  9.4 9.4 9.7  MG  --   --  1.6 2.2  --  2.0  --   PHOS  --   --  4.2 4.7*  --  4.1  --    Liver Function Tests:  Recent Labs Lab 07/27/12 0005  AST 20  ALT 11  ALKPHOS 171*  BILITOT 0.2*  PROT 6.7  ALBUMIN 3.2*    Recent Labs Lab 07/27/12 1015  LIPASE 25   No results found for this basename: AMMONIA,  in the last 168 hours CBC:  Recent Labs Lab 07/26/12 2334 07/26/12 2347 07/27/12 0544 07/30/12 1405  WBC 6.0  --  4.4 8.5  HGB 10.6* 10.9* 9.4* 10.9*  HCT 32.4* 32.0* 29.1* 34.4*  MCV 92.6  --  93.9 95.8  PLT 167  --  152 141*   Cardiac Enzymes: No results found for this basename: CKTOTAL, CKMB, CKMBINDEX, TROPONINI,  in the last 168 hours BNP (last 3 results) No results found for this basename: PROBNP,  in the last 8760 hours CBG: No results found for this basename: GLUCAP,  in the last 168 hours  Recent Results (from the past 240 hour(s))  URINE CULTURE     Status: None   Collection Time    07/27/12 12:07 AM      Result Value Range Status   Specimen Description URINE, RANDOM   Final   Special Requests NONE   Final   Culture  Setup Time 07/27/2012 09:47   Final   Colony Count >=100,000 COLONIES/ML   Final   Culture ENTEROCOCCUS SPECIES   Final   Report Status 07/29/2012 FINAL   Final   Organism ID, Bacteria ENTEROCOCCUS SPECIES   Final  CULTURE, BLOOD (ROUTINE X 2)     Status: None   Collection Time    07/27/12 12:20 AM      Result Value Range Status   Specimen Description BLOOD LEFT HAND   Final    Special Requests BOTTLES DRAWN AEROBIC AND ANAEROBIC 10CC EACH   Final   Culture  Setup Time 07/27/2012 09:44   Final   Culture  Final   Value:        BLOOD CULTURE RECEIVED NO GROWTH TO DATE CULTURE WILL BE HELD FOR 5 DAYS BEFORE ISSUING A FINAL NEGATIVE REPORT   Report Status PENDING   Incomplete  CULTURE, BLOOD (ROUTINE X 2)     Status: None   Collection Time    07/27/12 12:25 AM      Result Value Range Status   Specimen Description BLOOD RIGHT HAND   Final   Special Requests BOTTLES DRAWN AEROBIC ONLY 5CC   Final   Culture  Setup Time 07/27/2012 09:44   Final   Culture     Final   Value:        BLOOD CULTURE RECEIVED NO GROWTH TO DATE CULTURE WILL BE HELD FOR 5 DAYS BEFORE ISSUING A FINAL NEGATIVE REPORT   Report Status PENDING   Incomplete  MRSA PCR SCREENING     Status: None   Collection Time    07/27/12  4:20 AM      Result Value Range Status   MRSA by PCR NEGATIVE  NEGATIVE Final   Comment:            The GeneXpert MRSA Assay (FDA     approved for NASAL specimens     only), is one component of a     comprehensive MRSA colonization     surveillance program. It is not     intended to diagnose MRSA     infection nor to guide or     monitor treatment for     MRSA infections.     Studies: Mr Laqueta Jean Contrast  07/30/2012  *RADIOLOGY REPORT*  Clinical Data: Adrenal insufficiency.  Rule out pituitary lesion  MRI HEAD WITH CONTRAST  Technique:  Multiplanar, multiecho pulse sequences of the brain and surrounding structures were obtained according to standard protocol with intravenous contrast  Contrast: 8mL MULTIHANCE GADOBENATE DIMEGLUMINE 529 MG/ML IV SOLN half dose contrast was given due to renal insufficiency  Comparison: CT 07/26/2012  Findings: Image quality degraded by motion.  Generalized atrophy.  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  Mild chronic microvascular ischemia in the white matter and pons.  Negative for acute infarct.   Postcontrast imaging of the brain reveals no enhancing mass lesion. No intracranial hemorrhage.  Pituitary protocol was performed with dynamic scanning through the pituitary.  Unfortunately, the patient was moving during these images degrading image quality.  There is a possible 5 mm microadenoma in the right pituitary gland which shows decreased enhancement compared to expected pituitary tissue.  Infundibulum is midline.  No compression of the optic chiasm.  Cavernous sinus is normal.  IMPRESSION: Atrophy and chronic microvascular ischemia.  No acute infarct.  Possible 5 mm microadenoma involving the right side of the pituitary gland.  Image quality is suboptimal due to patient motion.   Original Report Authenticated By: Janeece Riggers, M.D.    Dg Chest Port 1 View  07/29/2012  *RADIOLOGY REPORT*  Clinical Data: Rule out basilar infiltrate.  PORTABLE CHEST - 1 VIEW  Comparison: Chest x-ray 07/27/2012.  Findings: Lungs are well expanded, without consolidative airspace disease or definite pleural effusions.  There are some patchy chronically increased interstitial opacities in the lung apices and the medial aspect of the lung bases which is similar to prior examinations.  No evidence of pulmonary edema.  Mild cardiomegaly is unchanged.  Upper mediastinal contours are within normal limits. Atherosclerosis of the thoracic aorta.  IMPRESSION: 1.  No definite radiographic evidence of acute cardiopulmonary disease. 2.  Chronically increased interstitial markings, as above, of uncertain etiology and significance. 3.  Mild cardiomegaly. 4.  Atherosclerosis.   Original Report Authenticated By: Trudie Reed, M.D.     Scheduled Meds: . aspirin  81 mg Oral Daily  . heparin  5,000 Units Subcutaneous Q8H  . hydrocortisone sodium succinate  50 mg Intravenous Q8H  . methadone  5 mg Oral Q8H  . multivitamin with minerals  1 tablet Oral Daily  . vancomycin  750 mg Intravenous Q24H   Continuous Infusions: . sodium  chloride 1,000 mL (07/29/12 1230)    Active Problems:   * No active hospital problems. *    Time spent: > 35 minutes    Penny Pia  [facility] Pager [phone]. If 7PM-7AM, please contact night-coverage at [email or web address], password [facility] 07/30/2012, 5:05 PM  LOS: 4 days

## 2012-07-31 DIAGNOSIS — I959 Hypotension, unspecified: Secondary | ICD-10-CM | POA: Diagnosis present

## 2012-07-31 DIAGNOSIS — Z8673 Personal history of transient ischemic attack (TIA), and cerebral infarction without residual deficits: Secondary | ICD-10-CM

## 2012-07-31 LAB — BASIC METABOLIC PANEL
Calcium: 9.9 mg/dL (ref 8.4–10.5)
Creatinine, Ser: 1.46 mg/dL — ABNORMAL HIGH (ref 0.50–1.10)
GFR calc Af Amer: 42 mL/min — ABNORMAL LOW (ref >90)

## 2012-07-31 MED ORDER — AMLODIPINE BESYLATE 10 MG PO TABS
10.0000 mg | ORAL_TABLET | Freq: Every day | ORAL | Status: DC
  Administered 2012-07-31 – 2012-08-02 (×3): 10 mg via ORAL
  Filled 2012-07-31 (×3): qty 1

## 2012-07-31 MED ORDER — HYDROCORTISONE SOD SUCCINATE 100 MG IJ SOLR
50.0000 mg | Freq: Two times a day (BID) | INTRAMUSCULAR | Status: DC
  Administered 2012-07-31 – 2012-08-01 (×2): 50 mg via INTRAVENOUS
  Filled 2012-07-31 (×3): qty 1

## 2012-07-31 MED ORDER — HYDRALAZINE HCL 20 MG/ML IJ SOLN: 10.0000 mg | Freq: Four times a day (QID) | INTRAMUSCULAR | Status: DC | PRN

## 2012-07-31 NOTE — Progress Notes (Signed)
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  Confusion / encephalopathy- Multifactorial, most likely opiod and benzodiazepine medication contributing. -  continue steroids   Chronic pain on high dose methadone  - methadone had been decreased due to somnolence.    COPD Stable    Acute kidney injury; Most likely due to hypotension. Creatinine steady. ? If she has CKD   Chronic anemia No evidence of acute blood loss, Hb stable   Enterococcus UTI sensitive to ampicillin and vancomycin -Patient has penicillin allergy, continue vancomycin  - Follow blood cultures  DVT Prophylaxis:  Code Status: Full code  Disposition:Not medically ready    Subjective: States feels some nausea, otherwise denies any specific complaints  Objective: Weight change: -0.208 kg (-7.4 oz)  Intake/Output Summary (Last 24 hours) at 07/31/12 1423 Last data filed at 07/31/12 1400  Gross per 24 hour  Intake      0 ml  Output   1150 ml  Net  -1150 ml   Blood pressure 164/88, pulse 65, temperature 98.3 F (36.8 C), temperature source Oral, resp. rate 18, height 5\' 6"  (1.676 m), weight 74 kg (163 lb 2.3 oz), SpO2 95.00%.  Physical Exam: General: Sleepy but easily arousable, not in any acute distress. CVS: S1-S2 clear, no murmur rubs or gallops Chest: clear to auscultation bilaterally, no wheezing, rales or rhonchi Abdomen: soft nontender, nondistended, normal bowel sounds,  Extremities: no cyanosis, clubbing or edema noted bilaterally   Lab Results: Basic Metabolic Panel:  Recent  Labs Lab 07/29/12 0450 07/30/12 1405  NA 143 143  K 4.6 4.4  CL 117* 115*  CO2 21 20  GLUCOSE 98 86  BUN 25* 24*  CREATININE 1.66* 1.66*  CALCIUM 9.4 9.7  MG 2.0  --   PHOS 4.1  --    Liver Function Tests:  Recent Labs Lab 07/27/12 0005  AST 20  ALT 11  ALKPHOS 171*  BILITOT 0.2*  PROT 6.7  ALBUMIN 3.2*    Recent Labs Lab 07/27/12 1015  LIPASE 25   No results found for this basename: AMMONIA,  in the last 168 hours CBC:  Recent Labs Lab 07/27/12 0544 07/30/12 1405  WBC 4.4 8.5  HGB 9.4* 10.9*  HCT 29.1* 34.4*  MCV 93.9 95.8  PLT 152 141*   Cardiac Enzymes: No results found for this basename: CKTOTAL, CKMB, CKMBINDEX, TROPONINI,  in the last 168 hours BNP: No components found with this basename: POCBNP,  CBG: No results found for this basename: GLUCAP,  in the last 168 hours   Micro Results: Recent Results (from the past 240 hour(s))  URINE CULTURE     Status: None   Collection Time    07/27/12 12:07 AM      Result Value Range Status   Specimen Description URINE, RANDOM   Final   Special Requests NONE   Final   Culture  Setup Time 07/27/2012 09:47   Final   Colony Count >=100,000 COLONIES/ML   Final   Culture ENTEROCOCCUS SPECIES   Final   Report Status 07/29/2012 FINAL   Final  Organism ID, Bacteria ENTEROCOCCUS SPECIES   Final  CULTURE, BLOOD (ROUTINE X 2)     Status: None   Collection Time    07/27/12 12:20 AM      Result Value Range Status   Specimen Description BLOOD LEFT HAND   Final   Special Requests BOTTLES DRAWN AEROBIC AND ANAEROBIC 10CC EACH   Final   Culture  Setup Time 07/27/2012 09:44   Final   Culture     Final   Value:        BLOOD CULTURE RECEIVED NO GROWTH TO DATE CULTURE WILL BE HELD FOR 5 DAYS BEFORE ISSUING A FINAL NEGATIVE REPORT   Report Status PENDING   Incomplete  CULTURE, BLOOD (ROUTINE X 2)     Status: None   Collection Time    07/27/12 12:25 AM      Result Value Range Status   Specimen Description BLOOD  RIGHT HAND   Final   Special Requests BOTTLES DRAWN AEROBIC ONLY 5CC   Final   Culture  Setup Time 07/27/2012 09:44   Final   Culture     Final   Value:        BLOOD CULTURE RECEIVED NO GROWTH TO DATE CULTURE WILL BE HELD FOR 5 DAYS BEFORE ISSUING A FINAL NEGATIVE REPORT   Report Status PENDING   Incomplete  MRSA PCR SCREENING     Status: None   Collection Time    07/27/12  4:20 AM      Result Value Range Status   MRSA by PCR NEGATIVE  NEGATIVE Final   Comment:            The GeneXpert MRSA Assay (FDA     approved for NASAL specimens     only), is one component of a     comprehensive MRSA colonization     surveillance program. It is not     intended to diagnose MRSA     infection nor to guide or     monitor treatment for     MRSA infections.    Studies/Results: Ct Head Wo Contrast  07/27/2012  *RADIOLOGY REPORT*  Clinical Data:  Fall, loss of consciousness,  CT HEAD WITHOUT CONTRAST CT CERVICAL SPINE WITHOUT CONTRAST  Technique:  Multidetector CT imaging of the head and cervical spine was performed following the standard protocol without intravenous contrast.  Multiplanar CT image reconstructions of the cervical spine were also generated.  Comparison:  Head CT 04/23/2012  CT HEAD  Findings: No intracranial hemorrhage.  No parenchymal contusion. No midline shift or mass effect.  Basilar cisterns are patent. No skull base fracture.  No fluid in the paranasal sinuses or mastoid air cells.  IMPRESSION: No intracranial trauma.  CT CERVICAL SPINE  Findings: No prevertebral soft tissue swelling.  Normal alignment of cervical vertebral bodies.  No loss of vertebral body height. Normal facet articulation.  Normal craniocervical junction.  No evidence epidural or paraspinal hematoma.  There is mild joint space narrowing and endplate osteophytosis at C5-C6.  IMPRESSION: No evidence of acute cervical spine fracture.  Mild spondylosis.   Original Report Authenticated By: Genevive Bi, M.D.    Ct  Cervical Spine Wo Contrast  07/27/2012  *RADIOLOGY REPORT*  Clinical Data:  Fall, loss of consciousness,  CT HEAD WITHOUT CONTRAST CT CERVICAL SPINE WITHOUT CONTRAST  Technique:  Multidetector CT imaging of the head and cervical spine was performed following the standard protocol without intravenous contrast.  Multiplanar CT image reconstructions of the cervical spine  were also generated.  Comparison:  Head CT 04/23/2012  CT HEAD  Findings: No intracranial hemorrhage.  No parenchymal contusion. No midline shift or mass effect.  Basilar cisterns are patent. No skull base fracture.  No fluid in the paranasal sinuses or mastoid air cells.  IMPRESSION: No intracranial trauma.  CT CERVICAL SPINE  Findings: No prevertebral soft tissue swelling.  Normal alignment of cervical vertebral bodies.  No loss of vertebral body height. Normal facet articulation.  Normal craniocervical junction.  No evidence epidural or paraspinal hematoma.  There is mild joint space narrowing and endplate osteophytosis at C5-C6.  IMPRESSION: No evidence of acute cervical spine fracture.  Mild spondylosis.   Original Report Authenticated By: Genevive Bi, M.D.    Mr Laqueta Jean Contrast  07/30/2012  *RADIOLOGY REPORT*  Clinical Data: Adrenal insufficiency.  Rule out pituitary lesion  MRI HEAD WITH CONTRAST  Technique:  Multiplanar, multiecho pulse sequences of the brain and surrounding structures were obtained according to standard protocol with intravenous contrast  Contrast: 8mL MULTIHANCE GADOBENATE DIMEGLUMINE 529 MG/ML IV SOLN half dose contrast was given due to renal insufficiency  Comparison: CT 07/26/2012  Findings: Image quality degraded by motion.  Generalized atrophy.  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  Mild chronic microvascular ischemia in the white matter and pons.  Negative for acute infarct.  Postcontrast imaging of the brain reveals no enhancing mass lesion. No intracranial hemorrhage.   Pituitary protocol was performed with dynamic scanning through the pituitary.  Unfortunately, the patient was moving during these images degrading image quality.  There is a possible 5 mm microadenoma in the right pituitary gland which shows decreased enhancement compared to expected pituitary tissue.  Infundibulum is midline.  No compression of the optic chiasm.  Cavernous sinus is normal.  IMPRESSION: Atrophy and chronic microvascular ischemia.  No acute infarct.  Possible 5 mm microadenoma involving the right side of the pituitary gland.  Image quality is suboptimal due to patient motion.   Original Report Authenticated By: Janeece Riggers, M.D.    Dg Chest Port 1 View  07/29/2012  *RADIOLOGY REPORT*  Clinical Data: Rule out basilar infiltrate.  PORTABLE CHEST - 1 VIEW  Comparison: Chest x-ray 07/27/2012.  Findings: Lungs are well expanded, without consolidative airspace disease or definite pleural effusions.  There are some patchy chronically increased interstitial opacities in the lung apices and the medial aspect of the lung bases which is similar to prior examinations.  No evidence of pulmonary edema.  Mild cardiomegaly is unchanged.  Upper mediastinal contours are within normal limits. Atherosclerosis of the thoracic aorta.  IMPRESSION: 1.  No definite radiographic evidence of acute cardiopulmonary disease. 2.  Chronically increased interstitial markings, as above, of uncertain etiology and significance. 3.  Mild cardiomegaly. 4.  Atherosclerosis.   Original Report Authenticated By: Trudie Reed, M.D.    Dg Chest Port 1 View  07/27/2012  *RADIOLOGY REPORT*  Clinical Data: Shortness of breath.  PORTABLE CHEST - 1 VIEW  Comparison: Chest radiograph performed 07/26/2012  Findings: The lungs are well-aerated.  Mild bibasilar opacities raise concern for pneumonia.  Pulmonary vascularity is at the upper limits of normal.  Chronic peribronchial thickening is seen.  No pleural effusion or pneumothorax is  identified.  The cardiomediastinal silhouette is borderline enlarged.  No acute osseous abnormalities are seen.  IMPRESSION:  1.  Mild bibasilar opacities raise concern for pneumonia. 2.  Borderline cardiomegaly.   Original Report Authenticated By: Tonia Ghent, M.D.  Dg Chest Portable 1 View  07/26/2012  *RADIOLOGY REPORT*  Clinical Data: Syncope  PORTABLE CHEST - 1 VIEW  Comparison: Chest radiograph 05/31/2012  Findings: Stable enlarged cardiac silhouette.  No effusion, infiltrate, or pneumothorax.  No aggressive osseous lesions.  IMPRESSION: Cardiomegaly without acute cardiopulmonary findings.   Original Report Authenticated By: Genevive Bi, M.D.     Medications: Scheduled Meds: . aspirin  81 mg Oral Daily  . heparin  5,000 Units Subcutaneous Q8H  . hydrocortisone sodium succinate  50 mg Intravenous Q8H  . methadone  5 mg Oral Q8H  . multivitamin with minerals  1 tablet Oral Daily  . vancomycin  750 mg Intravenous Q24H      LOS: 5 days   RAI,RIPUDEEP M.D. [facility] 07/31/2012, 2:23 PM Pager: [phone]  If 7PM-7AM, please contact night-coverage [email or web address] Password [email or web address]

## 2012-07-31 NOTE — Progress Notes (Addendum)
ANTIBIOTIC CONSULT NOTE - Follow-up  Pharmacy Consult for Vancomycin Indication: Enterococcal UTI  Allergies  Allergen Reactions  . Penicillins Anaphylaxis  . Sulfa Antibiotics Anaphylaxis  . Sertraline Hcl Other (See Comments)    Caused pt to be hyper and confussed  . Other     ALL ANTI DEPRESSANTS; MUSHROOMS  . Toradol (Ketorolac Tromethamine) Nausea And Vomiting  . Versed (Midazolam)     DID NOT WORK  . Codeine Itching and Rash   Patient Measurements: Height: 5\' 6"  (167.6 cm) Weight: 163 lb 2.3 oz (74 kg) IBW/kg (Calculated) : 59.3  Vital Signs: Temp: 99 F (37.2 C) (03/10 1400) Temp src: Oral (03/10 1400) BP: 166/96 mmHg (03/10 1400) Pulse Rate: 95 (03/10 1400)   Recent Labs  07/29/12 0450 07/30/12 1405 07/31/12 1400  WBC  --  8.5  --   HGB  --  10.9*  --   PLT  --  141*  --   CREATININE 1.66* 1.66* 1.46*   Estimated Creatinine Clearance: 39.5 ml/min (by C-G formula based on Cr of 1.46).   Assessment: 67 yo female continuing on vancomycin (Day #4) for enterococcus UTI (ampicillin/vanc sensitive--- however patient has penicillin allergy). Scr improved at 1.44 (CrCl~39), WBC 8.5, Tmax 99'F.  Blood cultures x2 pending (ngtd).  Vancomycin 3/7 >> Levaquin 3/6 -3/6  Plan:  1. Continue Vancomycin 750mg  IV q24h. 2. Continue to monitor renal function, culture data, LOT, vancomycin trough tomorrow if continues   Benjaman Pott, PharmD, BCPS 07/31/2012   4:22 PM

## 2012-08-01 LAB — MAGNESIUM: Magnesium: 1.5 mg/dL (ref 1.5–2.5)

## 2012-08-01 LAB — VANCOMYCIN, TROUGH: Vancomycin Tr: 10.9 ug/mL (ref 10.0–20.0)

## 2012-08-01 LAB — BASIC METABOLIC PANEL
Calcium: 9.6 mg/dL (ref 8.4–10.5)
GFR calc Af Amer: 54 mL/min — ABNORMAL LOW (ref >90)
GFR calc non Af Amer: 47 mL/min — ABNORMAL LOW (ref >90)
Sodium: 138 mEq/L (ref 135–145)

## 2012-08-01 LAB — POTASSIUM: Potassium: 3.2 mEq/L — ABNORMAL LOW (ref 3.5–5.1)

## 2012-08-01 MED ORDER — VANCOMYCIN HCL 10 G IV SOLR
1250.0000 mg | INTRAVENOUS | Status: DC
  Filled 2012-08-01: qty 1250

## 2012-08-01 MED ORDER — POTASSIUM CHLORIDE CRYS ER 20 MEQ PO TBCR
40.0000 meq | EXTENDED_RELEASE_TABLET | ORAL | Status: AC
  Administered 2012-08-01 (×2): 40 meq via ORAL
  Filled 2012-08-01 (×2): qty 2

## 2012-08-01 MED ORDER — POTASSIUM CHLORIDE 10 MEQ/100ML IV SOLN
10.0000 meq | INTRAVENOUS | Status: AC
  Administered 2012-08-01 (×2): 10 meq via INTRAVENOUS
  Filled 2012-08-01 (×2): qty 100

## 2012-08-01 MED ORDER — HYDROCORTISONE 5 MG PO TABS
5.0000 mg | ORAL_TABLET | Freq: Every day | ORAL | Status: DC
  Administered 2012-08-01: 5 mg via ORAL
  Filled 2012-08-01 (×2): qty 1

## 2012-08-01 MED ORDER — MAGNESIUM SULFATE 50 % IJ SOLN
2.0000 g | Freq: Once | INTRAVENOUS | Status: AC
  Administered 2012-08-01: 2 g via INTRAVENOUS
  Filled 2012-08-01: qty 4

## 2012-08-01 MED ORDER — HYDROCORTISONE 10 MG PO TABS
10.0000 mg | ORAL_TABLET | Freq: Every day | ORAL | Status: DC
  Administered 2012-08-02: 10 mg via ORAL
  Filled 2012-08-01 (×2): qty 1

## 2012-08-01 NOTE — Progress Notes (Signed)
Referral received today for SNF referral. Met with pt and asked permission to discuss private medical infor with family present. Pt stated yes, you can talk to my family. CSW discussed possible SNF and the pt declines SNF referral. CSW explained that the pt's medicare would pay 100% of the first 20 days. Full CSW assessment to follow.   Sherald Barge, LCSW-A Clinical Social Worker [phone]

## 2012-08-01 NOTE — Progress Notes (Signed)
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  Confusion / encephalopathy- Multifactorial, most likely opiod and benzodiazepine medication contributing, improving, patient is more alert and oriented today. -  continue steroids   Chronic pain on high dose methadone  - methadone had been decreased due to somnolence. - Need to verify her dose of methadone outpatient, need a safe disposition    COPD Stable    Acute kidney injury; Most likely due to hypotension. Creatinine steady. ? If she has CKD   Chronic anemia No evidence of acute blood loss, Hb stable  Hypokalemia, hypomagnesemia - Replaced IV and oral will recheck potassium level at 4pm today.  Enterococcus UTI sensitive to ampicillin and vancomycin -Patient has penicillin allergy, continue vancomycin  - Blood cultures negative so far  DVT Prophylaxis:  Code Status: Full code  Disposition: PT evaluation    Subjective: Much more alert and awake today, responding to questions  Objective: Weight change: 0.1 kg (3.5 oz)  Intake/Output Summary (Last 24 hours) at 08/01/12 1148 Last data filed at 08/01/12 1100  Gross per 24 hour  Intake      0 ml  Output   3250 ml  Net  -3250 ml   Blood pressure 160/80, pulse 98, temperature 98.9 F (37.2 C), temperature source Oral, resp. rate 18, height 5\' 6"  (1.676 m), weight 74.1 kg (163 lb 5.8 oz), SpO2 96.00%.  Physical Exam: General: Alert, awake and oriented today, no acute distress CVS: S1-S2 clear, no murmur rubs or gallops Chest: CTA B.  Abdomen: soft nontender, nondistended, normal  bowel sounds,  Extremities: no cyanosis, clubbing or edema noted bilaterally   Lab Results: Basic Metabolic Panel:  Recent Labs Lab 07/29/12 0450  07/31/12 1400 08/01/12 0836  NA 143  < > 144 138  K 4.6  < > 3.0* 2.5*  CL 117*  < > 109 101  CO2 21  < > 23 27  GLUCOSE 98  < > 85 122*  BUN 25*  < > 23 20  CREATININE 1.66*  < > 1.46* 1.19*  CALCIUM 9.4  < > 9.9 9.6  MG 2.0  --   --  1.5  PHOS 4.1  --   --   --   < > = values in this interval not displayed. Liver Function Tests:  Recent Labs Lab 07/27/12 0005  AST 20  ALT 11  ALKPHOS 171*  BILITOT 0.2*  PROT 6.7  ALBUMIN 3.2*    Recent Labs Lab 07/27/12 1015  LIPASE 25   No results found for this basename: AMMONIA,  in the last 168 hours CBC:  Recent Labs Lab 07/27/12 0544 07/30/12 1405  WBC 4.4 8.5  HGB 9.4* 10.9*  HCT 29.1* 34.4*  MCV 93.9 95.8  PLT 152 141*   Cardiac Enzymes: No results found for this basename: CKTOTAL, CKMB, CKMBINDEX, TROPONINI,  in the last 168 hours BNP: No components found with this basename: POCBNP,  CBG: No results found for this basename: GLUCAP,  in the last 168 hours   Micro Results: Recent Results (from the past 240 hour(s))  URINE CULTURE  Status: None   Collection Time    07/27/12 12:07 AM      Result Value Range Status   Specimen Description URINE, RANDOM   Final   Special Requests NONE   Final   Culture  Setup Time 07/27/2012 09:47   Final   Colony Count >=100,000 COLONIES/ML   Final   Culture ENTEROCOCCUS SPECIES   Final   Report Status 07/29/2012 FINAL   Final   Organism ID, Bacteria ENTEROCOCCUS SPECIES   Final  CULTURE, BLOOD (ROUTINE X 2)     Status: None   Collection Time    07/27/12 12:20 AM      Result Value Range Status   Specimen Description BLOOD LEFT HAND   Final   Special Requests BOTTLES DRAWN AEROBIC AND ANAEROBIC 10CC EACH   Final   Culture  Setup Time 07/27/2012 09:44   Final   Culture     Final   Value:        BLOOD CULTURE RECEIVED  NO GROWTH TO DATE CULTURE WILL BE HELD FOR 5 DAYS BEFORE ISSUING A FINAL NEGATIVE REPORT   Report Status PENDING   Incomplete  CULTURE, BLOOD (ROUTINE X 2)     Status: None   Collection Time    07/27/12 12:25 AM      Result Value Range Status   Specimen Description BLOOD RIGHT HAND   Final   Special Requests BOTTLES DRAWN AEROBIC ONLY 5CC   Final   Culture  Setup Time 07/27/2012 09:44   Final   Culture     Final   Value:        BLOOD CULTURE RECEIVED NO GROWTH TO DATE CULTURE WILL BE HELD FOR 5 DAYS BEFORE ISSUING A FINAL NEGATIVE REPORT   Report Status PENDING   Incomplete  MRSA PCR SCREENING     Status: None   Collection Time    07/27/12  4:20 AM      Result Value Range Status   MRSA by PCR NEGATIVE  NEGATIVE Final   Comment:            The GeneXpert MRSA Assay (FDA     approved for NASAL specimens     only), is one component of a     comprehensive MRSA colonization     surveillance program. It is not     intended to diagnose MRSA     infection nor to guide or     monitor treatment for     MRSA infections.    Studies/Results: Ct Head Wo Contrast  07/27/2012  *RADIOLOGY REPORT*  Clinical Data:  Fall, loss of consciousness,  CT HEAD WITHOUT CONTRAST CT CERVICAL SPINE WITHOUT CONTRAST  Technique:  Multidetector CT imaging of the head and cervical spine was performed following the standard protocol without intravenous contrast.  Multiplanar CT image reconstructions of the cervical spine were also generated.  Comparison:  Head CT 04/23/2012  CT HEAD  Findings: No intracranial hemorrhage.  No parenchymal contusion. No midline shift or mass effect.  Basilar cisterns are patent. No skull base fracture.  No fluid in the paranasal sinuses or mastoid air cells.  IMPRESSION: No intracranial trauma.  CT CERVICAL SPINE  Findings: No prevertebral soft tissue swelling.  Normal alignment of cervical vertebral bodies.  No loss of vertebral body height. Normal facet articulation.  Normal craniocervical  junction.  No evidence epidural or paraspinal hematoma.  There is mild joint space narrowing and endplate osteophytosis at C5-C6.  IMPRESSION: No evidence of  acute cervical spine fracture.  Mild spondylosis.   Original Report Authenticated By: Genevive Bi, M.D.    Ct Cervical Spine Wo Contrast  07/27/2012  *RADIOLOGY REPORT*  Clinical Data:  Fall, loss of consciousness,  CT HEAD WITHOUT CONTRAST CT CERVICAL SPINE WITHOUT CONTRAST  Technique:  Multidetector CT imaging of the head and cervical spine was performed following the standard protocol without intravenous contrast.  Multiplanar CT image reconstructions of the cervical spine were also generated.  Comparison:  Head CT 04/23/2012  CT HEAD  Findings: No intracranial hemorrhage.  No parenchymal contusion. No midline shift or mass effect.  Basilar cisterns are patent. No skull base fracture.  No fluid in the paranasal sinuses or mastoid air cells.  IMPRESSION: No intracranial trauma.  CT CERVICAL SPINE  Findings: No prevertebral soft tissue swelling.  Normal alignment of cervical vertebral bodies.  No loss of vertebral body height. Normal facet articulation.  Normal craniocervical junction.  No evidence epidural or paraspinal hematoma.  There is mild joint space narrowing and endplate osteophytosis at C5-C6.  IMPRESSION: No evidence of acute cervical spine fracture.  Mild spondylosis.   Original Report Authenticated By: Genevive Bi, M.D.    Mr Laqueta Jean Contrast  07/30/2012  *RADIOLOGY REPORT*  Clinical Data: Adrenal insufficiency.  Rule out pituitary lesion  MRI HEAD WITH CONTRAST  Technique:  Multiplanar, multiecho pulse sequences of the brain and surrounding structures were obtained according to standard protocol with intravenous contrast  Contrast: 8mL MULTIHANCE GADOBENATE DIMEGLUMINE 529 MG/ML IV SOLN half dose contrast was given due to renal insufficiency  Comparison: CT 07/26/2012  Findings: Image quality degraded by motion.  Generalized atrophy.   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  Mild chronic microvascular ischemia in the white matter and pons.  Negative for acute infarct.  Postcontrast imaging of the brain reveals no enhancing mass lesion. No intracranial hemorrhage.  Pituitary protocol was performed with dynamic scanning through the pituitary.  Unfortunately, the patient was moving during these images degrading image quality.  There is a possible 5 mm microadenoma in the right pituitary gland which shows decreased enhancement compared to expected pituitary tissue.  Infundibulum is midline.  No compression of the optic chiasm.  Cavernous sinus is normal.  IMPRESSION: Atrophy and chronic microvascular ischemia.  No acute infarct.  Possible 5 mm microadenoma involving the right side of the pituitary gland.  Image quality is suboptimal due to patient motion.   Original Report Authenticated By: Janeece Riggers, M.D.    Dg Chest Port 1 View  07/29/2012  *RADIOLOGY REPORT*  Clinical Data: Rule out basilar infiltrate.  PORTABLE CHEST - 1 VIEW  Comparison: Chest x-ray 07/27/2012.  Findings: Lungs are well expanded, without consolidative airspace disease or definite pleural effusions.  There are some patchy chronically increased interstitial opacities in the lung apices and the medial aspect of the lung bases which is similar to prior examinations.  No evidence of pulmonary edema.  Mild cardiomegaly is unchanged.  Upper mediastinal contours are within normal limits. Atherosclerosis of the thoracic aorta.  IMPRESSION: 1.  No definite radiographic evidence of acute cardiopulmonary disease. 2.  Chronically increased interstitial markings, as above, of uncertain etiology and significance. 3.  Mild cardiomegaly. 4.  Atherosclerosis.   Original Report Authenticated By: Trudie Reed, M.D.    Dg Chest Port 1 View  07/27/2012  *RADIOLOGY REPORT*  Clinical Data: Shortness of breath.  PORTABLE CHEST - 1 VIEW  Comparison: Chest  radiograph performed 07/26/2012  Findings:  The lungs are well-aerated.  Mild bibasilar opacities raise concern for pneumonia.  Pulmonary vascularity is at the upper limits of normal.  Chronic peribronchial thickening is seen.  No pleural effusion or pneumothorax is identified.  The cardiomediastinal silhouette is borderline enlarged.  No acute osseous abnormalities are seen.  IMPRESSION:  1.  Mild bibasilar opacities raise concern for pneumonia. 2.  Borderline cardiomegaly.   Original Report Authenticated By: Tonia Ghent, M.D.    Dg Chest Portable 1 View  07/26/2012  *RADIOLOGY REPORT*  Clinical Data: Syncope  PORTABLE CHEST - 1 VIEW  Comparison: Chest radiograph 05/31/2012  Findings: Stable enlarged cardiac silhouette.  No effusion, infiltrate, or pneumothorax.  No aggressive osseous lesions.  IMPRESSION: Cardiomegaly without acute cardiopulmonary findings.   Original Report Authenticated By: Genevive Bi, M.D.     Medications: Scheduled Meds: . amLODipine  10 mg Oral Daily  . aspirin  81 mg Oral Daily  . heparin  5,000 Units Subcutaneous Q8H  . [START ON 08/02/2012] hydrocortisone  10 mg Oral Daily  . hydrocortisone  5 mg Oral Q1400  . magnesium sulfate LVP 250-500 ml  2 g Intravenous Once  . methadone  5 mg Oral Q8H  . multivitamin with minerals  1 tablet Oral Daily  . potassium chloride  10 mEq Intravenous Q1 Hr x 2  . potassium chloride  40 mEq Oral Q4H  . vancomycin  750 mg Intravenous Q24H      LOS: 6 days   RAI,RIPUDEEP M.D. [facility] 08/01/2012, 11:48 AM Pager: [phone]  If 7PM-7AM, please contact night-coverage [email or web address] Password [email or web address]

## 2012-08-01 NOTE — Progress Notes (Signed)
ANTIBIOTIC CONSULT NOTE - Follow-up  Pharmacy Consult for Vancomycin Indication: Enterococcal UTI  Allergies  Allergen Reactions  . Penicillins Anaphylaxis  . Sulfa Antibiotics Anaphylaxis  . Sertraline Hcl Other (See Comments)    Caused pt to be hyper and confussed  . Other     ALL ANTI DEPRESSANTS; MUSHROOMS  . Toradol (Ketorolac Tromethamine) Nausea And Vomiting  . Versed (Midazolam)     DID NOT WORK  . Codeine Itching and Rash   Patient Measurements: Height: 5\' 6"  (167.6 cm) Weight: 163 lb 5.8 oz (74.1 kg) IBW/kg (Calculated) : 59.3  Vital Signs: Temp: 98.1 F (36.7 C) (03/11 1700) Temp src: Oral (03/11 1700) BP: 112/69 mmHg (03/11 1700) Pulse Rate: 64 (03/11 1700)   Recent Labs  07/30/12 1405 07/31/12 1400 08/01/12 0836  WBC 8.5  --   --   HGB 10.9*  --   --   PLT 141*  --   --   CREATININE 1.66* 1.46* 1.19*   Estimated Creatinine Clearance: 48.5 ml/min (by C-G formula based on Cr of 1.19).   Assessment: 67 yo female continuing on vancomycin (Day #5) for enterococcus UTI (ampicillin/vanc sensitive--- however patient has penicillin allergy). Scr continues to improve (CrCl~48), WBC 8.5 (3/9), Afebrile.  Blood cultures x2 pending (ngtd).  Vancomycin 3/7 >> Levaquin 3/6 -3/6  Vancomycin trough reported as 10.9 mcg/ml, 20 min prior to the dose.  Plan:  1. In light of improving renal function will increase  Vancomycin to 1250 mg IV q24h starting tomorrow. 2. Continue to monitor renal function, culture data, LOT.     Thank you for allowing pharmacy to be a part of this patients care team.  Lovenia Kim Pharm.D., BCPS Clinical Pharmacist 08/01/2012 7:56 PM Pager: [phone] Phone: [phone]

## 2012-08-01 NOTE — Progress Notes (Signed)
CRITICAL VALUE ALERT  Critical value received:  K: 2.5  Date of notification:  08/01/12   Time of notification:  1040  Critical value read back:yes  Nurse who received alert:  Albina Billet, RN  MD notified (1st page):  Dr. Isidoro Donning   Time of first page:  1045  MD notified (2nd page):  Time of second page:  Responding MD:  Dr. Isidoro Donning  Time MD responded:  1046

## 2012-08-02 ENCOUNTER — Other Ambulatory Visit (HOSPITAL_COMMUNITY): Payer: Self-pay | Admitting: Internal Medicine

## 2012-08-02 ENCOUNTER — Telehealth: Payer: Self-pay | Admitting: Family Medicine

## 2012-08-02 DIAGNOSIS — E2740 Unspecified adrenocortical insufficiency: Secondary | ICD-10-CM | POA: Diagnosis present

## 2012-08-02 DIAGNOSIS — E2749 Other adrenocortical insufficiency: Secondary | ICD-10-CM

## 2012-08-02 LAB — CULTURE, BLOOD (ROUTINE X 2): Culture: NO GROWTH

## 2012-08-02 LAB — BASIC METABOLIC PANEL
Calcium: 9.2 mg/dL (ref 8.4–10.5)
GFR calc Af Amer: >90 mL/min (ref >90)
GFR calc non Af Amer: >90 mL/min (ref >90)
Sodium: 140 mEq/L (ref 135–145)

## 2012-08-02 MED ORDER — NITROFURANTOIN MACROCRYSTAL 50 MG PO CAPS: 50.0000 mg | ORAL_CAPSULE | Freq: Four times a day (QID) | ORAL | Status: DC

## 2012-08-02 MED ORDER — HYDROCORTISONE 5 MG PO TABS: ORAL_TABLET | ORAL | Status: DC

## 2012-08-02 MED ORDER — NITROFURANTOIN MACROCRYSTAL 50 MG PO CAPS
50.0000 mg | ORAL_CAPSULE | Freq: Four times a day (QID) | ORAL | Status: DC
Start: 2012-08-02 — End: 2012-08-02
  Administered 2012-08-02 (×2): 50 mg via ORAL
  Filled 2012-08-02 (×4): qty 1

## 2012-08-02 NOTE — Evaluation (Signed)
Physical Therapy Evaluation Patient Details Name: [name] [name] MRN: [id number] DOB: 16-Jul-1945 Today's Date: 08/02/2012 Time: 7829-5621 PT Time Calculation (min): 25 min  PT Assessment / Plan / Recommendation Clinical Impression  Pt requiring MIN/guard A level with gait with RW with narrow BOS and decresed step length.  PT feels pt would do best going to SNF for rehab for safety and balance, but pt is refusing SNF at this time.  If pt goes home, she will need HHPT and 24 S due to general unsteadiness.    PT Assessment  Patient needs continued PT services    Follow Up Recommendations  Home health PT;SNF;Supervision/Assistance - 24 hour    Does the patient have the potential to tolerate intense rehabilitation      Barriers to Discharge    (Pt reports she doesn't have money for rehab- SW has discussed with pt- per notes)    Equipment Recommendations  Rolling walker with 5" wheels    Recommendations for Other Services     Frequency Min 3X/week    Precautions / Restrictions     Pertinent Vitals/Pain Chronic back pain      Mobility  Bed Mobility Supine to Sit: 6: Modified independent (Device/Increase time);HOB elevated;With rails Sitting - Scoot to Edge of Bed: 7: Independent Sit to Supine: 6: Modified independent (Device/Increase time);HOB elevated Transfers Transfers: Sit to Stand;Stand to Sit Sit to Stand: 4: Min guard Stand to Sit: 4: Min guard Details for Transfer Assistance: verbal cues for hand placement Ambulation/Gait Ambulation/Gait Assistance: 4: Min guard Ambulation Distance (Feet): 250 Feet Assistive device: Rolling walker Ambulation/Gait Assistance Details: Pt required verbal cues for posture and step length with RW. Amb on room air with o2 sats 92-95% Gait Pattern: Decreased step length - right;Decreased step length - left;Narrow base of support General Gait Details: R foot inverted    Exercises     PT Diagnosis: Difficulty walking  PT Problem List:  Decreased strength;Decreased balance;Decreased mobility;Decreased safety awareness PT Treatment Interventions: Gait training;Stair training;Functional mobility training;Therapeutic activities;Therapeutic exercise;Balance training;Patient/family education   PT Goals Acute Rehab PT Goals PT Goal Formulation: With patient Time For Goal Achievement: 08/09/12 Pt will go Supine/Side to Sit: Independently;with HOB 0 degrees PT Goal: Supine/Side to Sit - Progress: Goal set today Pt will go Sit to Supine/Side: Independently;with HOB 0 degrees PT Goal: Sit to Supine/Side - Progress: Goal set today Pt will go Sit to Stand: Independently PT Goal: Sit to Stand - Progress: Goal set today Pt will go Stand to Sit: Independently PT Goal: Stand to Sit - Progress: Goal set today Pt will Ambulate: >150 feet;with least restrictive assistive device;with supervision PT Goal: Ambulate - Progress: Goal set today Pt will Go Up / Down Stairs: 3-5 stairs;with rail(s) PT Goal: Up/Down Stairs - Progress: Goal set today  Visit Information  Last PT Received On: 08/02/12    Subjective Data  Subjective: "I don't have money to go to a rehab center." Patient Stated Goal: go home   Prior Functioning  Home Living Lives With: Spouse;Other (Comment) Available Help at Discharge: Family Type of Home: Mobile home Home Access: Stairs to enter Entrance Stairs-Number of Steps: 2-3 Entrance Stairs-Rails: Left;Right;Can reach both Home Layout: One level Home Adaptive Equipment: Quad cane;Built-in shower seat;Wheelchair - [employer] - standard Prior Function Level of Independence: Independent with assistive device(s) (quad cane) Able to Take Stairs?: Yes Driving: No Vocation: On disability    Cognition  Cognition Overall Cognitive Status: Appears within functional limits for tasks assessed/performed Arousal/Alertness: Awake/alert  Orientation Level: Appears intact for tasks assessed Behavior During Session: [facility] for  tasks performed    Extremity/Trunk Assessment Right Lower Extremity Assessment RLE ROM/Strength/Tone: [facility] for tasks assessed Left Lower Extremity Assessment LLE ROM/Strength/Tone: [facility] for tasks assessed   Balance    End of Session PT - End of Session Equipment Utilized During Treatment: Gait belt Activity Tolerance: Patient tolerated treatment well Patient left: in bed;with bed alarm set Nurse Communication: Mobility status;Other (comment) (o2 sats)  GP     SMITH,KAREN LUBECK 08/02/2012, 11:31 AM

## 2012-08-02 NOTE — Care Management Note (Unsigned)
    Page 1 of 1   08/02/2012     12:29:22 PM   CARE MANAGEMENT NOTE 08/02/2012  Patient:  [name] [name], [name] [name]   Account Number:  [id number]  Date Initiated:  07/31/2012  Documentation initiated by:  GRAVES-BIGELOW,BRENDA  Subjective/Objective Assessment:   Pt admitted for falls, hypotension and confusion. From home with family and plan is to return at d/c.     Action/Plan:   Will need HH services once pt stable for d/c.   Anticipated DC Date:  08/02/2012   Anticipated DC Plan:  HOME W HOME HEALTH SERVICES      DC Planning Services  CM consult      Choice offered to / List presented to:             Status of service:  In process, will continue to follow Medicare Important Message given?   (If response is "NO", the following Medicare IM given date fields will be blank) Date Medicare IM given:   Date Additional Medicare IM given:    Discharge Disposition:    Per UR Regulation:  Reviewed for med. necessity/level of care/duration of stay  If discussed at Long Length of Stay Meetings, dates discussed:    Comments:  08-02-12 1225 Tomi Bamberger, RN,BSN  [phone] CM did  f/u with pt and she lives wit Husband, cousin and his son. She has supervision at [employer]. Pt states she feels comfortable going home. Husband still drives and she states she has DME RW cane and WC. Pt will need orders for [facility] RN and PT at this time. CM did offer choice and pt needs to speak to husand to verify agency. Will make referral once Orders written for [facility] services. Will continue to f/u.

## 2012-08-02 NOTE — Telephone Encounter (Signed)
Pt recently discharged from hospital. Dx was hypotension, hypothermia, falls and confusion, possible adrenal insufficiency.   Can we call to check how home BP readings have been and schedule 30 min hosp f/u appt for Friday or early next week (w/in 1 wk of discharge)

## 2012-08-02 NOTE — Discharge Summary (Signed)
[facility]  Physician Discharge Summary   Patient ID: [name] [name] MRN: [id number] DOB/AGE: Sep 20, 1945 67 y.o.  Admit date: 07/26/2012 Discharge date: 08/02/2012  PCP: [name], MD  DISCHARGE DIAGNOSES:  Active Problems:   Chronic back pain   Acute renal insufficiency   H/O: CVA (cerebrovascular accident)   Anxiety   HLD (hyperlipidemia)   Adrenal insufficiency   RECOMMENDATIONS FOR OUTPATIENT FOLLOW UP: 1. Needs definitive testing for adrenal insufficiency 2. Needs close monitoring of narcotic and benzo use. 3. Needs close follow up for BP check.   DISCHARGE CONDITION: fair  Diet recommendation: Low sodium  Filed Weights   07/31/12 0500 08/01/12 0500 08/02/12 0400  Weight: 74 kg (163 lb 2.3 oz) 74.1 kg (163 lb 5.8 oz) 74 kg (163 lb 2.3 oz)    INITIAL HISTORY: 67 y/o female with chronic pain, and hypertension who presented to the [facility] ED on 3/5 after multiple falls at home and confusion. She was found to be hypotensive. Critical care medicine was consulted. She required pressors for less than 24 hours. She was also found to be hypothermic which resolved. BP has improved.   Consultations:  PCCM  Cardiology  Procedures: 2D ECHO Study Conclusions - Left ventricle: The cavity size was normal. Systolic function was normal. The estimated ejection fraction was in the range of 55% to 60%. Wall motion was normal; there were no regional wall motion abnormalities. - Mitral valve: Mild regurgitation. - Left atrium: The atrium was mildly dilated. - Right atrium: The atrium was mildly dilated. - Atrial septum: No defect or patent foramen ovale was identified.   HOSPITAL COURSE:   Hypotension, unspecified/ with bradycardia, hypothermia, adrenal insufficiency  Cortisol was low at 2.1, the patient was started on IV hydrocortisone and transitioned to oral agents. She will need deficintive evaluation for adrenal insufficiency. 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 Defer further management to PCP. Discussed with patient.  Confusion / encephalopathy Multifactorial, most likely opiod and benzodiazepine medication contributing. Patient made improvement daily is back to baseline.   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 Home dose of Methadone was verified. Patient has been on this dose for long time. Have asked her to discuss this issue further with her PCP.  COPD  Stable   Acute kidney injury;  Was most likely due to hypotension. Improved with IVF. Now at baseline.   Chronic anemia  No evidence of acute blood loss, Hb stable   Hypokalemia, hypomagnesemia  Repleted.   Enterococcus UTI sensitive to ampicillin and vancomycin  Patient has penicillin allergy. Will start Macrodantin as she has tolerated this in past. Blood cultures negative.  Await PT evaluation. SNF was offered but she has refused. Will see if she needs home health. If she tolrates macrodantin then she should be able to go home today. She says there is always someone with her at home. I have asked her to be careful about her pain medications. Since her BP was low at presentation her BP meds were held. Will resume Lasix for now as she will be on steroids. To follow with PCP in few days to further adjust BP meds.    PERTINENT LABS:  The results of significant diagnostics from this hospitalization (including imaging, microbiology, ancillary and laboratory) are listed below for reference.    Microbiology: Recent Results (from the past 240 hour(s))  URINE CULTURE     Status:  None   Collection Time    07/27/12 12:07 AM      Result Value Range Status   Specimen Description URINE, RANDOM   Final   Special Requests NONE   Final   Culture  Setup Time 07/27/2012 09:47   Final   Colony Count >=100,000 COLONIES/ML   Final   Culture ENTEROCOCCUS SPECIES   Final   Report Status  07/29/2012 FINAL   Final   Organism ID, Bacteria ENTEROCOCCUS SPECIES   Final  CULTURE, BLOOD (ROUTINE X 2)     Status: None   Collection Time    07/27/12 12:20 AM      Result Value Range Status   Specimen Description BLOOD LEFT HAND   Final   Special Requests BOTTLES DRAWN AEROBIC AND ANAEROBIC 10CC EACH   Final   Culture  Setup Time 07/27/2012 09:44   Final   Culture NO GROWTH 5 DAYS   Final   Report Status 08/02/2012 FINAL   Final  CULTURE, BLOOD (ROUTINE X 2)     Status: None   Collection Time    07/27/12 12:25 AM      Result Value Range Status   Specimen Description BLOOD RIGHT HAND   Final   Special Requests BOTTLES DRAWN AEROBIC ONLY 5CC   Final   Culture  Setup Time 07/27/2012 09:44   Final   Culture NO GROWTH 5 DAYS   Final   Report Status 08/02/2012 FINAL   Final  MRSA PCR SCREENING     Status: None   Collection Time    07/27/12  4:20 AM      Result Value Range Status   MRSA by PCR NEGATIVE  NEGATIVE Final   Comment:            The GeneXpert MRSA Assay (FDA     approved for NASAL specimens     only), is one component of a     comprehensive MRSA colonization     surveillance program. It is not     intended to diagnose MRSA     infection nor to guide or     monitor treatment for     MRSA infections.     Labs: Basic Metabolic Panel:  Recent Labs Lab 07/27/12 1015 07/28/12 0530  07/29/12 0450 07/30/12 1405 07/31/12 1400 08/01/12 0836 08/01/12 1734 08/02/12 0540  NA  --   --   < > 143 143 144 138  --  140  K  --   --   < > 4.6 4.4 3.0* 2.5* 3.2* 5.0  CL  --   --   < > 117* 115* 109 101  --  104  CO2  --   --   < > 21 20 23 27   --  29  GLUCOSE  --   --   < > 98 86 85 122*  --  123*  BUN  --   --   < > 25* 24* 23 20  --  16  CREATININE  --   --   < > 1.66* 1.66* 1.46* 1.19*  --  0.64  CALCIUM  --   --   < > 9.4 9.7 9.9 9.6  --  9.2  MG 1.6 2.2  --  2.0  --   --  1.5  --   --   PHOS 4.2 4.7*  --  4.1  --   --   --   --   --   < > =  values in this interval  not displayed. Liver Function Tests:  Recent Labs Lab 07/27/12 0005  AST 20  ALT 11  ALKPHOS 171*  BILITOT 0.2*  PROT 6.7  ALBUMIN 3.2*    Recent Labs Lab 07/27/12 1015  LIPASE 25   CBC:  Recent Labs Lab 07/26/12 2334 07/26/12 2347 07/27/12 0544 07/30/12 1405  WBC 6.0  --  4.4 8.5  HGB 10.6* 10.9* 9.4* 10.9*  HCT 32.4* 32.0* 29.1* 34.4*  MCV 92.6  --  93.9 95.8  PLT 167  --  152 141*    IMAGING STUDIES Ct Head Wo Contrast  07/27/2012  *RADIOLOGY REPORT*  Clinical Data:  Fall, loss of consciousness,  CT HEAD WITHOUT CONTRAST CT CERVICAL SPINE WITHOUT CONTRAST  Technique:  Multidetector CT imaging of the head and cervical spine was performed following the standard protocol without intravenous contrast.  Multiplanar CT image reconstructions of the cervical spine were also generated.  Comparison:  Head CT 04/23/2012  CT HEAD  Findings: No intracranial hemorrhage.  No parenchymal contusion. No midline shift or mass effect.  Basilar cisterns are patent. No skull base fracture.  No fluid in the paranasal sinuses or mastoid air cells.  IMPRESSION: No intracranial trauma.  CT CERVICAL SPINE  Findings: No prevertebral soft tissue swelling.  Normal alignment of cervical vertebral bodies.  No loss of vertebral body height. Normal facet articulation.  Normal craniocervical junction.  No evidence epidural or paraspinal hematoma.  There is mild joint space narrowing and endplate osteophytosis at C5-C6.  IMPRESSION: No evidence of acute cervical spine fracture.  Mild spondylosis.   Original Report Authenticated By: Genevive Bi, M.D.    Ct Cervical Spine Wo Contrast  07/27/2012  *RADIOLOGY REPORT*  Clinical Data:  Fall, loss of consciousness,  CT HEAD WITHOUT CONTRAST CT CERVICAL SPINE WITHOUT CONTRAST  Technique:  Multidetector CT imaging of the head and cervical spine was performed following the standard protocol without intravenous contrast.  Multiplanar CT image reconstructions of the  cervical spine were also generated.  Comparison:  Head CT 04/23/2012  CT HEAD  Findings: No intracranial hemorrhage.  No parenchymal contusion. No midline shift or mass effect.  Basilar cisterns are patent. No skull base fracture.  No fluid in the paranasal sinuses or mastoid air cells.  IMPRESSION: No intracranial trauma.  CT CERVICAL SPINE  Findings: No prevertebral soft tissue swelling.  Normal alignment of cervical vertebral bodies.  No loss of vertebral body height. Normal facet articulation.  Normal craniocervical junction.  No evidence epidural or paraspinal hematoma.  There is mild joint space narrowing and endplate osteophytosis at C5-C6.  IMPRESSION: No evidence of acute cervical spine fracture.  Mild spondylosis.   Original Report Authenticated By: Genevive Bi, M.D.    Mr Laqueta Jean Contrast  07/30/2012  *RADIOLOGY REPORT*  Clinical Data: Adrenal insufficiency.  Rule out pituitary lesion  MRI HEAD WITH CONTRAST  Technique:  Multiplanar, multiecho pulse sequences of the brain and surrounding structures were obtained according to standard protocol with intravenous contrast  Contrast: 8mL MULTIHANCE GADOBENATE DIMEGLUMINE 529 MG/ML IV SOLN half dose contrast was given due to renal insufficiency  Comparison: CT 07/26/2012  Findings: Image quality degraded by motion.  Generalized atrophy.  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  Mild chronic microvascular ischemia in the white matter and pons.  Negative for acute infarct.  Postcontrast imaging of the brain reveals no enhancing mass lesion. No intracranial hemorrhage.  Pituitary protocol was performed with dynamic scanning  through the pituitary.  Unfortunately, the patient was moving during these images degrading image quality.  There is a possible 5 mm microadenoma in the right pituitary gland which shows decreased enhancement compared to expected pituitary tissue.  Infundibulum is midline.  No compression of the optic  chiasm.  Cavernous sinus is normal.  IMPRESSION: Atrophy and chronic microvascular ischemia.  No acute infarct.  Possible 5 mm microadenoma involving the right side of the pituitary gland.  Image quality is suboptimal due to patient motion.   Original Report Authenticated By: Janeece Riggers, M.D.    Dg Chest Port 1 View  07/29/2012  *RADIOLOGY REPORT*  Clinical Data: Rule out basilar infiltrate.  PORTABLE CHEST - 1 VIEW  Comparison: Chest x-ray 07/27/2012.  Findings: Lungs are well expanded, without consolidative airspace disease or definite pleural effusions.  There are some patchy chronically increased interstitial opacities in the lung apices and the medial aspect of the lung bases which is similar to prior examinations.  No evidence of pulmonary edema.  Mild cardiomegaly is unchanged.  Upper mediastinal contours are within normal limits. Atherosclerosis of the thoracic aorta.  IMPRESSION: 1.  No definite radiographic evidence of acute cardiopulmonary disease. 2.  Chronically increased interstitial markings, as above, of uncertain etiology and significance. 3.  Mild cardiomegaly. 4.  Atherosclerosis.   Original Report Authenticated By: Trudie Reed, M.D.    Dg Chest Port 1 View  07/27/2012  *RADIOLOGY REPORT*  Clinical Data: Shortness of breath.  PORTABLE CHEST - 1 VIEW  Comparison: Chest radiograph performed 07/26/2012  Findings: The lungs are well-aerated.  Mild bibasilar opacities raise concern for pneumonia.  Pulmonary vascularity is at the upper limits of normal.  Chronic peribronchial thickening is seen.  No pleural effusion or pneumothorax is identified.  The cardiomediastinal silhouette is borderline enlarged.  No acute osseous abnormalities are seen.  IMPRESSION:  1.  Mild bibasilar opacities raise concern for pneumonia. 2.  Borderline cardiomegaly.   Original Report Authenticated By: Tonia Ghent, M.D.    Dg Chest Portable 1 View  07/26/2012  *RADIOLOGY REPORT*  Clinical Data: Syncope  PORTABLE  CHEST - 1 VIEW  Comparison: Chest radiograph 05/31/2012  Findings: Stable enlarged cardiac silhouette.  No effusion, infiltrate, or pneumothorax.  No aggressive osseous lesions.  IMPRESSION: Cardiomegaly without acute cardiopulmonary findings.   Original Report Authenticated By: Genevive Bi, M.D.     DISCHARGE EXAMINATION: Filed Vitals:   08/01/12 0600 08/01/12 1700 08/01/12 2100 08/02/12 0400  BP: 160/80 112/69 143/75 145/74  Pulse: 98 64 60 53  Temp: 98.9 F (37.2 C) 98.1 F (36.7 C) 98.5 F (36.9 C) 98.3 F (36.8 C)  TempSrc:  Oral Oral Oral  Resp:  18 17 16   Height:      Weight:    74 kg (163 lb 2.3 oz)  SpO2: 96% 97% 96% 96%   General appearance: alert, cooperative, appears stated age and no distress Back: symmetric, no curvature. ROM normal. No CVA tenderness. Resp: clear to auscultation bilaterally Cardio: regular rate and rhythm, S1, S2 normal, no murmur, click, rub or gallop GI: soft, non-tender; bowel sounds normal; no masses,  no organomegaly Skin: Skin color, texture, turgor normal. No rashes or lesions Neurologic: Alert and oriented X 3, normal strength and tone.   DISPOSITION: Home   Current Discharge Medication List    START taking these medications   Details  hydrocortisone (CORTEF) 5 MG tablet Take 2 tabs at 8Am and 1 Tab at 8PM. Qty: 90 tablet, Refills: 0  nitrofurantoin (MACRODANTIN) 50 MG capsule Take 1 capsule (50 mg total) by mouth every 6 (six) hours. For 4 days Qty: 16 capsule, Refills: 0      CONTINUE these medications which have NOT CHANGED   Details  albuterol (PROVENTIL HFA;VENTOLIN HFA) 108 (90 BASE) MCG/ACT inhaler Inhale 2 puffs into the lungs every 6 (six) hours as needed for wheezing. Qty: 1 Inhaler, Refills: 2    ALPRAZolam (XANAX) 1 MG tablet Take 2 tablets (2 mg total) by mouth 4 (four) times daily as needed for anxiety. For anxiety Qty: 100 tablet, Refills: 0    aspirin 81 MG chewable tablet Chew 1 tablet (81 mg total) by  mouth daily.    furosemide (LASIX) 20 MG tablet Take 10 mg by mouth 2 (two) times daily.    gabapentin (NEURONTIN) 300 MG capsule Take 300-1,200 mg by mouth 2 (two) times daily. 300 mg at lunch and 1200 mg at bedtime    methadone (DOLOPHINE) 10 MG tablet Take 5 tablets (50 mg total) by mouth every 6 (six) hours as needed for pain. Qty: 280 tablet, Refills: 0    Multiple Vitamin (MULTIVITAMIN WITH MINERALS) TABS Take 1 tablet by mouth daily.      STOP taking these medications     lisinopril-hydrochlorothiazide (PRINZIDE,ZESTORETIC) 10-12.5 MG per tablet          TOTAL DISCHARGE TIME: 35 mins  [facility]  [facility] Pager [phone]  08/02/2012, 10:52 AM

## 2012-08-02 NOTE — Progress Notes (Signed)
Late Entry:  CM did speak to pt and husband was in the room stating that they do not have power at home and he was not taking pt home tonight. CM stated to husband that this was not a reason to keep her overnight. MD states pt is medically ready for d/c. CM did ask if she was on home02 and she stated no. CM then stated that pt would be okay for d/c and that insurance will not pay for pt's stay due to medically ready for d/c. D/c summary available in system.  Thanks Gala Lewandowsky, RN,BSN [phone]

## 2012-08-03 NOTE — Telephone Encounter (Signed)
Spoke with patient's husband. Her BP was 140/79 last night and seems to be running pretty stable since discharge. Follow up has already been scheduled for Monday of next week.

## 2012-08-06 ENCOUNTER — Encounter: Payer: Self-pay | Admitting: Family Medicine

## 2012-08-07 ENCOUNTER — Encounter: Payer: Self-pay | Admitting: Family Medicine

## 2012-08-07 ENCOUNTER — Ambulatory Visit (INDEPENDENT_AMBULATORY_CARE_PROVIDER_SITE_OTHER): Payer: Medicare Other | Admitting: Family Medicine

## 2012-08-07 VITALS — BP 118/68 | HR 55 | Temp 98.1°F

## 2012-08-07 DIAGNOSIS — F419 Anxiety disorder, unspecified: Secondary | ICD-10-CM

## 2012-08-07 DIAGNOSIS — D352 Benign neoplasm of pituitary gland: Secondary | ICD-10-CM

## 2012-08-07 NOTE — Patient Instructions (Addendum)
Return fasting for blood work to further evaluate possible pituitary adenoma.  We may refer to endocrinologist depending on blood work results. No new blood pressure medicines today - but keep track of BP at home and let me know if consistently elevated. Sign pain contract today. We will try slow wean off of methadone. You do have blockages in arteries of heart - continue aspirin daily, ensure good blood pressure control and we will check cholesterol levels when you return fasting.

## 2012-08-07 NOTE — Progress Notes (Signed)
Subjective:    Patient ID: [name] [name], female    DOB: 29-Dec-1945, 67 y.o.   MRN: [id number]  HPI CC: hospital f/u  Admitted to [facility] after recurrent falls at home and found to be hypotensive, bradycardic, hypothermic and concern for adrenal insufficiency with cortisol level of 2.1.  Also was found to have Enterococcal UTI treated with nitrofurantoin 500mg  qid x 4 more days - completed course.  All records reviewed.  blcx NG x2.  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  Also started on hydrocortisone 10mg  in am and 5mg  in pm.    H/o HTN - due to hypotension, lisinopril hctz was stopped.  Continued lasix but only uses PRN.  States bp at home running 140/70s.    Anxiety - states in hospital xanax was decreased to 1mg  four times daily.  Severe anxiety from sister's death.  Pt states she remembers falling down stairs prior to hospitalization.  According to husband, pt was more confused "not with it" and had trouble understanding questions presented to her.  He became worried she was entering coma so he called EMS.  Episode happened just after pt received her methadone and xanax prescription from me.  Denies overdose.    MR brain with contrast:  IMPRESSION: Atrophy and chronic microvascular ischemia. No acute infarct. Possible 5 mm microadenoma involving the right side of the pituitary gland. Image quality is suboptimal due to patient motion.    Endorses rash behind knee - trouble standing up when awakens 2/2 pain.    Denies urinary sxs such as dysuria, urgency, hematuria.  Some trouble emptying completely. Denies fevers/chills, headache, chest pain, shortness of breath, coughing.  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  [facility] cannot afford pain clinic evaluation.  Asks about slow wean off methadone.  Has had multiple evals with pain clinic.  Currently on methadone 30mg  four times a day (120mg  daily) - has cut  down on her own to this dose.    F/u phone call date: 08/03/2012 Admit date: 07/26/2012  Discharge date: 08/02/2012   DISCHARGE DIAGNOSES:  Active Problems: Chronic back pain  Acute renal insufficiency  H/O: CVA (cerebrovascular accident)  Anxiety  HLD (hyperlipidemia)  Adrenal insufficiency  RECOMMENDATIONS FOR OUTPATIENT FOLLOW UP:  1. Needs definitive testing for adrenal insufficiency 2. Needs close monitoring of narcotic and benzo use. 3. Needs close follow up for BP check.  DISCHARGE CONDITION: fair  Diet recommendation: Low sodium  Filed Weights    07/31/12 0500  08/01/12 0500  08/02/12 0400   Weight:  74 kg (163 lb 2.3 oz)  74.1 kg (163 lb 5.8 oz)  74 kg (163 lb 2.3 oz)   INITIAL HISTORY:  67 y/o female with chronic pain, and hypertension who presented to the [facility] ED on 3/5 after multiple falls at home and confusion. She was found to be hypotensive. Critical care medicine was consulted. She required pressors for less than 24 hours. She was also found to be hypothermic which resolved. BP has improved.  Consultations:  PCCM  Cardiology Procedures:  2D ECHO  Study Conclusions - Left ventricle: The cavity size was normal. Systolic function was normal. The estimated ejection fraction was in the range of 55% to 60%. Wall motion was normal; there were no regional wall motion abnormalities. - Mitral valve: Mild regurgitation. - Left atrium: The atrium was mildly dilated. - Right atrium: The atrium was mildly dilated. - Atrial septum:  No defect or patent foramen ovale was identified.  HOSPITAL COURSE:  Hypotension, unspecified/ with bradycardia, hypothermia, adrenal insufficiency  Cortisol was low at 2.1, the patient was started on IV hydrocortisone and transitioned to oral agents. She will need deficintive evaluation for adrenal insufficiency. MRI reviewed and shows a 5 mm microadenoma involving the right side of the pituitary gland. Defer further management to PCP. Discussed with  patient.  Confusion / encephalopathy  Multifactorial, most likely opiod and benzodiazepine medication contributing. Patient made improvement daily is back to baseline.  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 Home dose of Methadone was verified. Patient has been on this dose for long time. Have asked her to discuss this issue further with her PCP.  COPD  Stable  Acute kidney injury;  Was most likely due to hypotension. Improved with IVF. Now at baseline.  Chronic anemia  No evidence of acute blood loss, Hb stable  Hypokalemia, hypomagnesemia  Repleted.  Enterococcus UTI sensitive to ampicillin and vancomycin  Patient has penicillin allergy. Will start Macrodantin as she has tolerated this in past. Blood cultures negative.  Await PT evaluation. SNF was offered but she has refused. Will see if she needs home health. If she tolrates macrodantin then she should be able to go home today. She says there is always someone with her at home. I have asked her to be careful about her pain medications. Since her BP was low at presentation her BP meds were held. Will resume Lasix for now as she will be on steroids. To follow with PCP in few days to further adjust BP meds.  PERTINENT LABS:   The results of significant diagnostics from this hospitalization (including imaging, microbiology, ancillary and laboratory) are listed below for reference.   Microbiology:  Recent Results (from the past 240 hour(s))   URINE CULTURE Status: None    Collection Time    07/27/12 12:07 AM   Result  Value  Range  Status    Specimen Description  URINE, RANDOM   Final    Special Requests  NONE   Final    Culture Setup Time  07/27/2012 09:47   Final    Colony Count  >=100,000 COLONIES/ML   Final    Culture  ENTEROCOCCUS SPECIES   Final    Report Status  07/29/2012 FINAL   Final    Organism ID, Bacteria  ENTEROCOCCUS SPECIES   Final   CULTURE, BLOOD (ROUTINE X 2) Status: None     Collection Time    07/27/12 12:20 AM   Result  Value  Range  Status    Specimen Description  BLOOD LEFT HAND   Final    Special Requests  BOTTLES DRAWN AEROBIC AND ANAEROBIC 10CC EACH   Final    Culture Setup Time  07/27/2012 09:44   Final    Culture  NO GROWTH 5 DAYS   Final    Report Status  08/02/2012 FINAL   Final   CULTURE, BLOOD (ROUTINE X 2) Status: None    Collection Time    07/27/12 12:25 AM   Result  Value  Range  Status    Specimen Description  BLOOD RIGHT HAND   Final    Special Requests  BOTTLES DRAWN AEROBIC ONLY 5CC   Final    Culture Setup Time  07/27/2012 09:44   Final    Culture  NO GROWTH 5 DAYS   Final    Report Status  08/02/2012  FINAL   Final   MRSA PCR SCREENING Status: None    Collection Time    07/27/12 4:20 AM   Result  Value  Range  Status    MRSA by PCR  NEGATIVE  NEGATIVE  Final    Comment:      The GeneXpert MRSA Assay (FDA     approved for NASAL specimens     only), is one component of a     comprehensive MRSA colonization     surveillance program. It is not     intended to diagnose MRSA     infection nor to guide or     monitor treatment for     MRSA infections.   Labs:  Basic Metabolic Panel:  Recent Labs  Lab  07/27/12 1015  07/28/12 0530   07/29/12 0450  07/30/12 1405  07/31/12 1400  08/01/12 0836  08/01/12 1734  08/02/12 0540   NA  --  --  < >  143  143  144  138  --  140   K  --  --  < >  4.6  4.4  3.0*  2.5*  3.2*  5.0   CL  --  --  < >  117*  115*  109  101  --  104   CO2  --  --  < >  21  20  23  27   --  29   GLUCOSE  --  --  < >  98  86  85  122*  --  123*   BUN  --  --  < >  25*  24*  23  20  --  16   CREATININE  --  --  < >  1.66*  1.66*  1.46*  1.19*  --  0.64   CALCIUM  --  --  < >  9.4  9.7  9.9  9.6  --  9.2   MG  1.6  2.2  --  2.0  --  --  1.5  --  --   PHOS  4.2  4.7*  --  4.1  --  --  --  --  --   < > = values in this interval not displayed.  Liver Function Tests:  Recent Labs  Lab  07/27/12 0005   AST   20   ALT  11   ALKPHOS  171*   BILITOT  0.2*   PROT  6.7   ALBUMIN  3.2*   Recent Labs  Lab  07/27/12 1015   LIPASE  25   CBC:  Recent Labs  Lab  07/26/12 2334  07/26/12 2347  07/27/12 0544  07/30/12 1405   WBC  6.0  --  4.4  8.5   HGB  10.6*  10.9*  9.4*  10.9*   HCT  32.4*  32.0*  29.1*  34.4*   MCV  92.6  --  93.9  95.8   PLT  167  --  152  141*   IMAGING STUDIES  Ct Head Wo Contrast  07/27/2012 *RADIOLOGY REPORT* Clinical Data: Fall, loss of consciousness, CT HEAD WITHOUT CONTRAST CT CERVICAL SPINE WITHOUT CONTRAST Technique: Multidetector CT imaging of the head and cervical spine was performed following the standard protocol without intravenous contrast. Multiplanar CT image reconstructions of the cervical spine were also generated. Comparison: Head CT 04/23/2012 CT HEAD Findings: No intracranial hemorrhage. No parenchymal contusion. No midline shift or mass effect. Basilar cisterns are patent.  No skull base fracture. No fluid in the paranasal sinuses or mastoid air cells. IMPRESSION: No intracranial trauma. CT CERVICAL SPINE Findings: No prevertebral soft tissue swelling. Normal alignment of cervical vertebral bodies. No loss of vertebral body height. Normal facet articulation. Normal craniocervical junction. No evidence epidural or paraspinal hematoma. There is mild joint space narrowing and endplate osteophytosis at C5-C6. IMPRESSION: No evidence of acute cervical spine fracture. Mild spondylosis. Original Report Authenticated By: Genevive Bi, M.D.  Ct Cervical Spine Wo Contrast  07/27/2012 *RADIOLOGY REPORT* Clinical Data: Fall, loss of consciousness, CT HEAD WITHOUT CONTRAST CT CERVICAL SPINE WITHOUT CONTRAST Technique: Multidetector CT imaging of the head and cervical spine was performed following the standard protocol without intravenous contrast. Multiplanar CT image reconstructions of the cervical spine were also generated. Comparison: Head CT 04/23/2012 CT HEAD Findings: No  intracranial hemorrhage. No parenchymal contusion. No midline shift or mass effect. Basilar cisterns are patent. No skull base fracture. No fluid in the paranasal sinuses or mastoid air cells. IMPRESSION: No intracranial trauma. CT CERVICAL SPINE Findings: No prevertebral soft tissue swelling. Normal alignment of cervical vertebral bodies. No loss of vertebral body height. Normal facet articulation. Normal craniocervical junction. No evidence epidural or paraspinal hematoma. There is mild joint space narrowing and endplate osteophytosis at C5-C6. IMPRESSION: No evidence of acute cervical spine fracture. Mild spondylosis. Original Report Authenticated By: Genevive Bi, M.D.  Mr Laqueta Jean Contrast  07/30/2012 *RADIOLOGY REPORT* Clinical Data: Adrenal insufficiency. Rule out pituitary lesion MRI HEAD WITH CONTRAST Technique: Multiplanar, multiecho pulse sequences of the brain and surrounding structures were obtained according to standard protocol with intravenous contrast Contrast: 8mL MULTIHANCE GADOBENATE DIMEGLUMINE 529 MG/ML IV SOLN half dose contrast was given due to renal insufficiency Comparison: CT 07/26/2012 Findings: Image quality degraded by motion. Generalized atrophy. 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 Mild chronic microvascular ischemia in the white matter and pons. Negative for acute infarct. Postcontrast imaging of the brain reveals no enhancing mass lesion. No intracranial hemorrhage. Pituitary protocol was performed with dynamic scanning through the pituitary. Unfortunately, the patient was moving during these images degrading image quality. There is a possible 5 mm microadenoma in the right pituitary gland which shows decreased enhancement compared to expected pituitary tissue. Infundibulum is midline. No compression of the optic chiasm. Cavernous sinus is normal. IMPRESSION: Atrophy and chronic microvascular ischemia. No acute infarct. Possible 5 mm  microadenoma involving the right side of the pituitary gland. Image quality is suboptimal due to patient motion. Original Report Authenticated By: Janeece Riggers, M.D.  Dg Chest Port 1 View  07/29/2012 *RADIOLOGY REPORT* Clinical Data: Rule out basilar infiltrate. PORTABLE CHEST - 1 VIEW Comparison: Chest x-ray 07/27/2012. Findings: Lungs are well expanded, without consolidative airspace disease or definite pleural effusions. There are some patchy chronically increased interstitial opacities in the lung apices and the medial aspect of the lung bases which is similar to prior examinations. No evidence of pulmonary edema. Mild cardiomegaly is unchanged. Upper mediastinal contours are within normal limits. Atherosclerosis of the thoracic aorta. IMPRESSION: 1. No definite radiographic evidence of acute cardiopulmonary disease. 2. Chronically increased interstitial markings, as above, of uncertain etiology and significance. 3. Mild cardiomegaly. 4. Atherosclerosis. Original Report Authenticated By: Trudie Reed, M.D.  Dg Chest Port 1 View  07/27/2012 *RADIOLOGY REPORT* Clinical Data: Shortness of breath. PORTABLE CHEST - 1 VIEW Comparison: Chest radiograph performed 07/26/2012 Findings: The lungs are well-aerated. Mild bibasilar opacities raise concern for pneumonia. Pulmonary vascularity is at the upper limits  of normal. Chronic peribronchial thickening is seen. No pleural effusion or pneumothorax is identified. The cardiomediastinal silhouette is borderline enlarged. No acute osseous abnormalities are seen. IMPRESSION: 1. Mild bibasilar opacities raise concern for pneumonia. 2. Borderline cardiomegaly. Original Report Authenticated By: Tonia Ghent, M.D.  Dg Chest Portable 1 View  07/26/2012 *RADIOLOGY REPORT* Clinical Data: Syncope PORTABLE CHEST - 1 VIEW Comparison: Chest radiograph 05/31/2012 Findings: Stable enlarged cardiac silhouette. No effusion, infiltrate, or pneumothorax. No aggressive osseous lesions.  IMPRESSION: Cardiomegaly without acute cardiopulmonary findings. Original Report Authenticated By: Genevive Bi, M.D.  DISCHARGE EXAMINATION:  Filed Vitals:    08/01/12 0600  08/01/12 1700  08/01/12 2100  08/02/12 0400   BP:  160/80  112/69  143/75  145/74   Pulse:  98  64  60  53   Temp:  98.9 F (37.2 C)  98.1 F (36.7 C)  98.5 F (36.9 C)  98.3 F (36.8 C)   TempSrc:   Oral  Oral  Oral   Resp:   18  17  16    Height:       Weight:     74 kg (163 lb 2.3 oz)   SpO2:  96%  97%  96%  96%   General appearance: alert, cooperative, appears stated age and no distress  Back: symmetric, no curvature. ROM normal. No CVA tenderness.  Resp: clear to auscultation bilaterally  Cardio: regular rate and rhythm, S1, S2 normal, no murmur, click, rub or gallop  GI: soft, non-tender; bowel sounds normal; no masses, no organomegaly  Skin: Skin color, texture, turgor normal. No rashes or lesions  Neurologic: Alert and oriented X 3, normal strength and tone.  DISPOSITION: Home  Current Discharge Medication List     START taking these medications    Details   hydrocortisone (CORTEF) 5 MG tablet  Take 2 tabs at 8Am and 1 Tab at 8PM.  Qty: 90 tablet, Refills: 0     nitrofurantoin (MACRODANTIN) 50 MG capsule  Take 1 capsule (50 mg total) by mouth every 6 (six) hours. For 4 days  Qty: 16 capsule, Refills: 0       CONTINUE these medications which have NOT CHANGED    Details   albuterol (PROVENTIL HFA;VENTOLIN HFA) 108 (90 BASE) MCG/ACT inhaler  Inhale 2 puffs into the lungs every 6 (six) hours as needed for wheezing.  Qty: 1 Inhaler, Refills: 2     ALPRAZolam (XANAX) 1 MG tablet  Take 2 tablets (2 mg total) by mouth 4 (four) times daily as needed for anxiety. For anxiety  Qty: 100 tablet, Refills: 0     aspirin 81 MG chewable tablet  Chew 1 tablet (81 mg total) by mouth daily.     furosemide (LASIX) 20 MG tablet  Take 10 mg by mouth 2 (two) times daily.     gabapentin (NEURONTIN) 300 MG capsule   Take 300-1,200 mg by mouth 2 (two) times daily. 300 mg at lunch and 1200 mg at bedtime     methadone (DOLOPHINE) 10 MG tablet  Take 5 tablets (50 mg total) by mouth every 6 (six) hours as needed for pain.  Qty: 280 tablet, Refills: 0     Multiple Vitamin (MULTIVITAMIN WITH MINERALS) TABS  Take 1 tablet by mouth daily.       STOP taking these medications      lisinopril-hydrochlorothiazide (PRINZIDE,ZESTORETIC) 10-12.5 MG per tablet         Past Medical History  Diagnosis Date  . Arachnoiditis 1970  due to her Pantopaque myelograms in the 1970s, evidence of this on CT scan 11/2002  . Hypertension   . MVP (mitral valve prolapse)     h/o scarlet fever  . Chronic bronchitis   . HLD (hyperlipidemia)   . Colon polyps   . H/O: CVA (cerebrovascular accident)     TIA  . Urine incontinence   . Anxiety     severe with panic attacks  . CAD (coronary artery disease) 2002    catheterization: 60% RCA stenosis, LAD 30%, L circ 25%  . Hydradenitis 2001    chronic, perineal s/p surgeries ([facility]) ?sweet's syndrome  . Psychogenic syncope 2003  . Orthostatic hypotension 2002  . DDD (degenerative disc disease), lumbar     mult HNP with radiculopathy s/p surgeries  . Chronic pain syndrome     high dose methadone since hospitalization 05/16/2002, has not tolerated lyrica, cymbalta, zanaflex  . Chronic back pain     failed ESI and sympathetic blocks  . Osteoarthritis of both knees     severe    Review of Systems Per HPI    Objective:   Physical Exam  Nursing note and vitals reviewed. Constitutional: She appears well-developed and well-nourished. No distress.  Stiff movements throughout  HENT:  Head: Normocephalic and atraumatic.  Mouth/Throat: Oropharynx is clear and moist. No oropharyngeal exudate.  Eyes: Conjunctivae and EOM are normal. Pupils are equal, round, and reactive to light.  Neck: Normal range of motion. Neck supple.  Cardiovascular: Normal rate, regular rhythm and  intact distal pulses.   Murmur (3/6 SEM at LUSB) heard. Pulmonary/Chest: Effort normal and breath sounds normal. No respiratory distress. She has no wheezes. She has no rales.  Musculoskeletal: She exhibits no edema.  No popliteal fullness, no palpable cords, no pitting edema, no rash posterior knees No knee deformity or pain to palpation anterior or lateral knees  Skin: Skin is warm and dry. No rash noted.       Assessment & Plan:

## 2012-08-09 ENCOUNTER — Encounter: Payer: Self-pay | Admitting: Family Medicine

## 2012-08-09 DIAGNOSIS — M170 Bilateral primary osteoarthritis of knee: Secondary | ICD-10-CM | POA: Insufficient documentation

## 2012-08-09 DIAGNOSIS — I2510 Atherosclerotic heart disease of native coronary artery without angina pectoris: Secondary | ICD-10-CM | POA: Insufficient documentation

## 2012-08-09 DIAGNOSIS — I10 Essential (primary) hypertension: Secondary | ICD-10-CM | POA: Insufficient documentation

## 2012-08-09 NOTE — Assessment & Plan Note (Signed)
Known CAD by cath 2002.  Denies chest pain sxs, discussed importance of notifying us if this occurs. Compliant with aspirin daily - emphasized importance of this med.

## 2012-08-09 NOTE — Assessment & Plan Note (Addendum)
Reviewed extensive workup in past as well as office notes from Dr. Sharene Skeans.  Still awaiting records from Drs. Foy Gu[place] and [employer]. Has had trial of countless medications, methadone seemed to work best for her. I discussed with her my hesitance to prescribe such high amounts of med, especially in setting of 1. Seeming lack of efficacy for pain control and 2. recent life threatening hypotension/hypothermia and concern for narcotic/benzo contributing. Pt expresses interest in slow wean off methadone and will see how she does.  Also pt and husband state unable to afford pain management clinic currently. I will take over prescribing methadone for now, will attempt slow taper off methadone.  Currently states taking 3 pills four times a day (total 120mg  daily).  Discussed slow taper will be 5-10 % every month.  Start next month. Last received #280 pills methadone 10mg  on 07/26/2012. pain contract filled out today and copy provided to patient.

## 2012-08-09 NOTE — Assessment & Plan Note (Signed)
Continue aspirin - discussed importance of not missing doses.

## 2012-08-09 NOTE — Assessment & Plan Note (Signed)
Completing nitrofurantoin course.

## 2012-08-09 NOTE — Assessment & Plan Note (Signed)
Question of this with possible pituitary microadenoma found on hospital imaging. Will assess for hypersecretory adenoma when returns early morning for blood work - check cortisol, Free T4 and T3, 24 hour urine cortisol, ACTH, and prolactin. Lab Results  Component Value Date   TSH 1.246 07/27/2012  May refer to endo for assistance.

## 2012-08-09 NOTE — Assessment & Plan Note (Signed)
See above

## 2012-08-09 NOTE — Assessment & Plan Note (Addendum)
Per pt has titrated down to alprazolam 1mg  qid and doing well with this.

## 2012-08-09 NOTE — Assessment & Plan Note (Signed)
Not on statin.  Check FLP next fasting blood draw.

## 2012-08-09 NOTE — Assessment & Plan Note (Signed)
Given marked hypotension in hospital presentation, ACEI/HCTZ was stopped. Good bp control today, did not restart. Only to use lasix prn leg swelling which is how she's been taking this.

## 2012-08-10 DIAGNOSIS — N390 Urinary tract infection, site not specified: Secondary | ICD-10-CM

## 2012-08-10 DIAGNOSIS — M545 Low back pain: Secondary | ICD-10-CM

## 2012-08-10 DIAGNOSIS — I10 Essential (primary) hypertension: Secondary | ICD-10-CM

## 2012-08-10 DIAGNOSIS — G039 Meningitis, unspecified: Secondary | ICD-10-CM

## 2012-08-10 DIAGNOSIS — E2749 Other adrenocortical insufficiency: Secondary | ICD-10-CM

## 2012-08-15 ENCOUNTER — Telehealth: Payer: Self-pay | Admitting: Family Medicine

## 2012-08-15 NOTE — Telephone Encounter (Signed)
Called the [facility], spoke to the scheduler to check on the Pain appt referral that we sent to them. They wanted you to know that patient has not been scheduled and that they had a referral from Dr Porfirio Mylar Dohmeier back in March 2012 and the patient was given an appt and never showed for it and also with many attempts to reach the patient she never followed up to set up another. Do you want me to cancel this referral? Please advise.

## 2012-08-15 NOTE — Telephone Encounter (Signed)
Yes plz cancel referral. Thanks.

## 2012-08-17 ENCOUNTER — Other Ambulatory Visit: Payer: Medicare Other

## 2012-08-17 ENCOUNTER — Ambulatory Visit: Payer: Medicare Other | Admitting: Family Medicine

## 2012-08-20 ENCOUNTER — Encounter: Payer: Self-pay | Admitting: Family Medicine

## 2012-08-21 ENCOUNTER — Other Ambulatory Visit: Payer: Self-pay

## 2012-08-21 MED ORDER — ALPRAZOLAM 1 MG PO TABS: 1.0000 mg | ORAL_TABLET | Freq: Four times a day (QID) | ORAL | Status: DC | PRN

## 2012-08-21 MED ORDER — METHADONE HCL 10 MG PO TABS: 30.0000 mg | ORAL_TABLET | Freq: Four times a day (QID) | ORAL | Status: DC | PRN

## 2012-08-21 NOTE — Telephone Encounter (Signed)
Left message with patient's husband to have her call back when she wakes up.

## 2012-08-21 NOTE — Telephone Encounter (Signed)
Rx called to pharmacy. No answer at home number, will try again later. Rx is ready for pickup.

## 2012-08-21 NOTE — Telephone Encounter (Signed)
Pt request alprazolam called to [facility] on 08/25/12 and request to pick up methadone rx on 08/25/12.Please advise. Call when ready for pick up.

## 2012-08-21 NOTE — Telephone Encounter (Signed)
plz phone in alprazolam. plz notify methadone will be ready for pick up 08/25/2012.  Placed in Kim's box.

## 2012-08-22 NOTE — Telephone Encounter (Signed)
Patient advised by telephone as instructed by telephone.

## 2012-08-24 ENCOUNTER — Telehealth: Payer: Self-pay

## 2012-08-24 NOTE — Telephone Encounter (Signed)
Please have her refer to her after visit summary on 08/07/2012 when she came and saw me after hospitalization. We discussed all this at that time. Plan was to have her come in for blood work - see AVS.

## 2012-08-24 NOTE — Telephone Encounter (Signed)
Attempted to call patient. No answer, no machine. Will try again later. Message from Darl Pikes at [facility] on my VM who has been following patient for her hypotension. She said her BP has stablized and she is discharging her today. However, patient has been stumbling a lot especially when coming down the stairs and Darl Pikes would like order for PT faxed to [phone] if possible.  She doesn't think that her cane is what pt needs to be using for walking assistance.

## 2012-08-24 NOTE — Telephone Encounter (Signed)
Pt admitted to [facility] 07/26/12; pt said was in hospital 8 days and has not seen a doctor since discharged and wants results of all test done while in hospital. Pt said supposed to schedule appt with Dr Reece Agar for f/u of hypotension. Due to pt's scheduled pt could not make appt with Dr Reece Agar until 08/29/12.

## 2012-08-24 NOTE — Telephone Encounter (Signed)
Ok to do this -  PT to eval and treat for imbalance, history of falls, eval for ambulatory assistive device if pt willing.

## 2012-08-25 ENCOUNTER — Other Ambulatory Visit: Payer: Self-pay | Admitting: *Deleted

## 2012-08-25 ENCOUNTER — Other Ambulatory Visit (INDEPENDENT_AMBULATORY_CARE_PROVIDER_SITE_OTHER): Payer: Medicare Other

## 2012-08-25 DIAGNOSIS — D353 Benign neoplasm of craniopharyngeal duct: Secondary | ICD-10-CM

## 2012-08-25 DIAGNOSIS — D352 Benign neoplasm of pituitary gland: Secondary | ICD-10-CM

## 2012-08-25 DIAGNOSIS — I2510 Atherosclerotic heart disease of native coronary artery without angina pectoris: Secondary | ICD-10-CM

## 2012-08-25 LAB — CORTISOL: Cortisol, Plasma: 37.7 ug/dL

## 2012-08-25 LAB — LIPID PANEL
Cholesterol: 179 mg/dL (ref 0–200)
LDL Cholesterol: 107 mg/dL — ABNORMAL HIGH (ref 0–99)
Total CHOL/HDL Ratio: 3

## 2012-08-25 NOTE — Telephone Encounter (Signed)
Order faxed to [facility] for PT.

## 2012-08-26 LAB — PROLACTIN: Prolactin: 9.7 ng/mL

## 2012-08-28 LAB — ACTH: C206 ACTH: 12 pg/mL (ref 10–46)

## 2012-08-28 LAB — INSULIN-LIKE GROWTH FACTOR: Somatomedin (IGF-I): 103 ng/mL (ref 31–179)

## 2012-08-29 ENCOUNTER — Other Ambulatory Visit: Payer: Self-pay | Admitting: Family Medicine

## 2012-08-29 ENCOUNTER — Ambulatory Visit: Payer: Medicare Other | Admitting: Family Medicine

## 2012-08-29 MED ORDER — HYDROCORTISONE 5 MG PO TABS: ORAL_TABLET | ORAL | Status: DC

## 2012-08-31 ENCOUNTER — Ambulatory Visit: Payer: Medicare Other | Admitting: Family Medicine

## 2012-08-31 DIAGNOSIS — F411 Generalized anxiety disorder: Secondary | ICD-10-CM

## 2012-08-31 DIAGNOSIS — E2749 Other adrenocortical insufficiency: Secondary | ICD-10-CM

## 2012-08-31 DIAGNOSIS — G8929 Other chronic pain: Secondary | ICD-10-CM

## 2012-08-31 DIAGNOSIS — Z8673 Personal history of transient ischemic attack (TIA), and cerebral infarction without residual deficits: Secondary | ICD-10-CM

## 2012-08-31 DIAGNOSIS — M1710 Unilateral primary osteoarthritis, unspecified knee: Secondary | ICD-10-CM

## 2012-08-31 DIAGNOSIS — A879 Viral meningitis, unspecified: Secondary | ICD-10-CM

## 2012-08-31 DIAGNOSIS — N390 Urinary tract infection, site not specified: Secondary | ICD-10-CM

## 2012-08-31 DIAGNOSIS — I10 Essential (primary) hypertension: Secondary | ICD-10-CM

## 2012-08-31 DIAGNOSIS — E785 Hyperlipidemia, unspecified: Secondary | ICD-10-CM

## 2012-08-31 DIAGNOSIS — I2510 Atherosclerotic heart disease of native coronary artery without angina pectoris: Secondary | ICD-10-CM

## 2012-08-31 DIAGNOSIS — M549 Dorsalgia, unspecified: Secondary | ICD-10-CM

## 2012-08-31 DIAGNOSIS — D352 Benign neoplasm of pituitary gland: Secondary | ICD-10-CM

## 2012-08-31 DIAGNOSIS — M5137 Other intervertebral disc degeneration, lumbosacral region: Secondary | ICD-10-CM

## 2012-09-13 ENCOUNTER — Ambulatory Visit: Payer: Medicare Other | Admitting: Family Medicine

## 2012-09-14 ENCOUNTER — Ambulatory Visit (INDEPENDENT_AMBULATORY_CARE_PROVIDER_SITE_OTHER): Payer: Medicare Other | Admitting: Family Medicine

## 2012-09-14 ENCOUNTER — Encounter: Payer: Self-pay | Admitting: Family Medicine

## 2012-09-14 ENCOUNTER — Ambulatory Visit (INDEPENDENT_AMBULATORY_CARE_PROVIDER_SITE_OTHER)
Admission: RE | Admit: 2012-09-14 | Discharge: 2012-09-14 | Disposition: A | Discharge status: Home / Self Care | Payer: Medicare Other | Source: Ambulatory Visit | Attending: Family Medicine | Admitting: Family Medicine

## 2012-09-14 VITALS — BP 134/82 | HR 62 | Temp 97.4°F | Ht 63.0 in | Wt 135.2 lb

## 2012-09-14 DIAGNOSIS — M25569 Pain in unspecified knee: Secondary | ICD-10-CM

## 2012-09-14 DIAGNOSIS — M170 Bilateral primary osteoarthritis of knee: Secondary | ICD-10-CM

## 2012-09-14 DIAGNOSIS — M25562 Pain in left knee: Secondary | ICD-10-CM

## 2012-09-14 DIAGNOSIS — M1710 Unilateral primary osteoarthritis, unspecified knee: Secondary | ICD-10-CM

## 2012-09-14 DIAGNOSIS — E2749 Other adrenocortical insufficiency: Secondary | ICD-10-CM

## 2012-09-14 DIAGNOSIS — E2740 Unspecified adrenocortical insufficiency: Secondary | ICD-10-CM

## 2012-09-14 MED ORDER — NAPROXEN 500 MG PO TABS: ORAL_TABLET | ORAL | Status: DC

## 2012-09-14 NOTE — Patient Instructions (Addendum)
Xray today. Start using knee brace. Use ice and heat to knee. Start naprosyn twice daily with food for 5 days then only use as needed. If not better with this, we will refer you to orthopedist. Pass by Marion's office for referral to endocrinology.

## 2012-09-14 NOTE — Progress Notes (Signed)
Subjective:    Patient ID: [name] [name], female    DOB: 10-15-1945, 67 y.o.   MRN: [id number]  HPI CC: leg pain  [name] [name] presents today as acute visit for leg pain.  She has h/o chronic back pain and arachnoiditis on long term methadone for this. I have been prescribing methadone 30mg  TID monthly for this (#280/month), prior she was on 50mg  QID.  Multiple spine surgeries in past. On disability for back.  HHPT has been working with her for deconditioning and imbalance.  They came out today - were worried about left knee swelling, suggested she see her PCP. She endorses 1 wk h/o left buttock pain that radiates to back of knee and calf.  difficulty walking 2/2 pain.  Trouble sleeping 2/2 pain.   Denies numbness of left leg.   Denies fevers/chills. Multiple falls recently - PT working on balance.  Not on hormonal meds.  No prolonged immobility recently.  No personal hx blood clots. Father, grandfather, mother with h/o DVTs according to patient.    States has had L knee surgery in past, thinks with [name] [name]. Does not think would want steroid injection into knee.  Would not want knee surgery either.  Possible microadenoma on MRI from hospitalization, with concern for adrenal insufficiency (see prior note) - has been taking hydrocortisone 10mg  at 8am and 5mg  at 8pm since hospital discharge.    Past Medical History  Diagnosis Date  . Arachnoiditis 1970    due to her Pantopaque myelograms in the 1970s, evidence of this on CT scan 11/2002  . Hypertension   . MVP (mitral valve prolapse)     h/o scarlet fever  . COPD (chronic obstructive pulmonary disease) 2003    by CXR, chronic bronchitis  . HLD (hyperlipidemia)   . Colon polyps   . H/O: CVA (cerebrovascular accident)     TIA  . Urine incontinence   . Anxiety     severe with panic attacks  . CAD (coronary artery disease) 2002    catheterization: 60% RCA stenosis, LAD 30%, L circ 25%  . Hydradenitis 2001    chronic, perineal s/p  surgeries ([facility]) ?sweet's syndrome  . Psychogenic syncope 2003  . Orthostatic hypotension 2002  . DDD (degenerative disc disease), lumbar 2003    mult HNP with radiculopathy s/p mult surgeries and fusion  . Chronic pain syndrome     high dose methadone since hospitalization 05/16/2002, has not tolerated lyrica, cymbalta, zanaflex  . Chronic back pain     failed ESI and sympathetic blocks  . Osteoarthritis of both knees     severe  . Aspiration pneumonia 02/2012    Past Surgical History  Procedure Laterality Date  . Laminectomy  1976,1977,1999,2003    x 4  . Abdominal hysterectomy  1985  . Appendectomy  1963  . Breast biopsy  1966  . Cardiac catheterization  2004  . Lumbar fusion  2003    L4/5 laminotomy with posterior lumbar interbody fusion ([facility])  . Knee arthroscopy Left     Erik Obey   Family History  Problem Relation Age of Onset  . Arthritis    . CAD Mother     MI  . CAD Father     MI s/p 3v CABG  . Kidney failure Father     ESRD on HD  . Stroke Mother   . Cancer Neg Hx   . Diabetes Neg Hx   . Deep vein thrombosis Mother   . Deep vein  thrombosis Father   . Deep vein thrombosis Paternal Grandfather    Review of Systems Per HPI    Objective:   Physical Exam  Nursing note and vitals reviewed. Constitutional: She appears well-developed and well-nourished. No distress.  HENT:  Head: Normocephalic and atraumatic.  Mouth/Throat: Oropharynx is clear and moist. No oropharyngeal exudate.  Eyes: Conjunctivae and EOM are normal. Pupils are equal, round, and reactive to light. No scleral icterus.  Neck: Normal range of motion. Neck supple.  Cardiovascular: Normal rate, regular rhythm, normal heart sounds and intact distal pulses.   No murmur heard. Pulses:      Dorsalis pedis pulses are 2+ on the right side, and 2+ on the left side.  Pulmonary/Chest: Effort normal and breath sounds normal. No respiratory distress. She has no wheezes. She has no rales.   Musculoskeletal: She exhibits no edema.  R calf circ 35cm L calf circ 33cm No palpable cords. No poplital fullness.  FROM at right knee. L knee: tender to palpation entire knee throughout circumference - medial/lateral joint lines, suprapatellar (with fullness), popliteal area, less at patella L knee with crepitus with extension, unable to extend past 120 degrees 2/2 pain.  Edema and swelling present at knee joint.  Skin: Skin is warm and dry. No rash noted.       Assessment & Plan:

## 2012-09-15 ENCOUNTER — Encounter: Payer: Self-pay | Admitting: Family Medicine

## 2012-09-15 NOTE — Assessment & Plan Note (Signed)
With ?of pituitary adenoma on MRI at recent hospitalization. Recent blood work evaluating for hypersecretory adenoma returned normal - including 10 AM cortisol, IGF-1, prolactin, TSH, free T4 and T3, and ACTH (albeit while on cortef). Has not completed 24 hour urine cortisol. I will refer to endo for input on ?insufficiency and adenoma, as well as recommendations for continued hydrocortisone vs taper off (has been on for >1 mo now).

## 2012-09-15 NOTE — Assessment & Plan Note (Addendum)
Anticipate arthritis flare from working with physical therapy vs meniscal injury. Examining [name] [name] is difficult/limited as she is in constant chronic pain, and it's difficult to tell what is chronic and what is acute pain. Some inconsistent endorsement of pain on exam. No evidence of DVT on exam today, left calf actually smaller in circumference than right. As no recent xrays have been done, I suggested we start with left knee xray to eval arthritic burden and r/o acute process. Offered naprosyn for her presumed arthritis pain - sent this to pharmacy although pt states this usually doesn't help. Recommended use of knee sleeve and resting knee for next 1-2 wks. Declines several therapeutic interventions including any intraarticular injection or surgery, declines ortho referral today for further eval meniscus. Pt became upset that I wasn't prescibing stronger narcotic.

## 2012-09-18 ENCOUNTER — Other Ambulatory Visit: Payer: Self-pay

## 2012-09-18 MED ORDER — ALPRAZOLAM 1 MG PO TABS: 1.0000 mg | ORAL_TABLET | Freq: Four times a day (QID) | ORAL | Status: DC | PRN

## 2012-09-18 MED ORDER — METHADONE HCL 10 MG PO TABS: 30.0000 mg | ORAL_TABLET | Freq: Three times a day (TID) | ORAL | Status: DC | PRN

## 2012-09-18 NOTE — Telephone Encounter (Signed)
Please call in the xanax.  Dr. Reece Agar is tapering her methadone; I wrote the rx for the current dose but it is short term and I'll defer to him to adjust the taper upon his return to clinic.  This is a 10 day supply.

## 2012-09-18 NOTE — Telephone Encounter (Signed)
Pt request alprazolam called to [facility] and written rx methadone to be picked up on 09/22/12. Call when ready for pick up.

## 2012-09-19 NOTE — Telephone Encounter (Signed)
Rx called in as directed. Patient notified and Rx placed up front for pick up. 

## 2012-09-25 NOTE — Telephone Encounter (Signed)
Pt said received her methadone # 90 and alprazolam # 120 on 05/02/014. Pt said is out of med today; pt said has taken both meds as prescribed. Pt said she will continue to search for medicine bottles but wants Dr Reece Agar to be aware. Pt said she cannot remember if she put them in another bottle. Pt said she is by herself today and takes her own medicines. Pt will ck with her husband to see if new med might be in the car. Pt said she is not asking for another rx but she feels confused this morning; offered pt appt but she said no she is OK and will continue to lookl for med.Please advise.

## 2012-09-25 NOTE — Telephone Encounter (Signed)
Called patient to let her know that prescriptions could not be re-written and she said that she had just found them in her cousins room.

## 2012-09-28 ENCOUNTER — Ambulatory Visit (INDEPENDENT_AMBULATORY_CARE_PROVIDER_SITE_OTHER): Payer: Medicare Other | Admitting: Family Medicine

## 2012-09-28 ENCOUNTER — Encounter: Payer: Self-pay | Admitting: Family Medicine

## 2012-09-28 VITALS — BP 150/80 | HR 68 | Temp 97.7°F | Wt 135.0 lb

## 2012-09-28 DIAGNOSIS — M549 Dorsalgia, unspecified: Secondary | ICD-10-CM

## 2012-09-28 DIAGNOSIS — G8929 Other chronic pain: Secondary | ICD-10-CM

## 2012-09-28 MED ORDER — METHADONE HCL 10 MG PO TABS: 30.0000 mg | ORAL_TABLET | Freq: Three times a day (TID) | ORAL | Status: DC | PRN

## 2012-09-28 NOTE — Patient Instructions (Signed)
I've refilled 1 month supply of methadone - #280 pills for the next month. I think left leg pain is coming from bulging disk in back. Return to see me at next month scheduled appointment. Good to see you today, call us with questions.

## 2012-09-28 NOTE — Progress Notes (Signed)
  Subjective:    Patient ID: [name] [name], female    DOB: 12/29/1945, 67 y.o.   MRN: [id number]  HPI CC: L leg pain  Today is 1 year anniversary of younger sister's death, unclear cause.  Received #90 methadone on 09/18/2012 from [place] to last 10 days (from Dr. Para March while I was out of town).  Ran out of methadone yesterday.  PT working with her at home.  Recommended she use quad cane.  Continued falls.  Persistent L leg pain from buttock to foot.  Known HNP and lumbar DDD.  States has been on amitriptyline and sertraline in past - caused confusion.  Does not want to try any other antidepressant even if may help pain.  Past Medical History  Diagnosis Date  . Arachnoiditis 1970    due to her Pantopaque myelograms in the 1970s, evidence of this on CT scan 11/2002  . Hypertension   . MVP (mitral valve prolapse)     h/o scarlet fever  . COPD (chronic obstructive pulmonary disease) 2003    by CXR, chronic bronchitis  . HLD (hyperlipidemia)   . Colon polyps   . H/O: CVA (cerebrovascular accident)     TIA  . Urine incontinence   . Anxiety     severe with panic attacks  . CAD (coronary artery disease) 2002    catheterization: 60% RCA stenosis, LAD 30%, L circ 25%  . Hydradenitis 2001    chronic, perineal s/p surgeries ([facility]) ?sweet's syndrome  . Psychogenic syncope 2003  . Orthostatic hypotension 2002  . DDD (degenerative disc disease), lumbar 2003    mult HNP with radiculopathy s/p mult surgeries and fusion  . Chronic pain syndrome     high dose methadone since hospitalization 05/16/2002, has not tolerated lyrica, cymbalta, zanaflex  . Chronic back pain     failed ESI and sympathetic blocks  . Osteoarthritis of both knees     severe  . Aspiration pneumonia 02/2012  . 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 Laminectomy   1976,1977,1999,2003    x 4  . Abdominal hysterectomy  1985  . Appendectomy  1963  . Breast biopsy  1966  . Cardiac catheterization  2004  . Lumbar fusion  2003    L4/5 laminotomy with posterior lumbar interbody fusion ([facility])  . Knee arthroscopy Left     Erik Obey     Review of Systems Per HPI    Objective:   Physical Exam  Nursing note and vitals reviewed. Constitutional: She appears well-developed and well-nourished. No distress.  Starts crying when I enter room, prior was reading magazine calmly  Musculoskeletal:  Midline spine tenderness around lower lumbar region. +++ pain at L sciatic notch. 5/5 strength BLE, left side noted limited by pain.      Assessment & Plan:

## 2012-09-28 NOTE — Assessment & Plan Note (Signed)
With current L radiculopathy but no red flags such as numbness or weakness. Anticipate due to known HNP, declines steroid shots or further surgical intervention. Discussed this and other treatment options.  Pt states does not currently want trial of antidepressant or pain modulating agent (consider gabapentin vs lyrica in future). Will refill methadone today. rtc 1 mo for f/u.

## 2012-10-04 ENCOUNTER — Ambulatory Visit: Payer: Medicare Other | Admitting: Internal Medicine

## 2012-10-17 ENCOUNTER — Encounter: Payer: Self-pay | Admitting: Radiology

## 2012-10-18 ENCOUNTER — Ambulatory Visit (INDEPENDENT_AMBULATORY_CARE_PROVIDER_SITE_OTHER): Payer: Medicare Other | Admitting: Internal Medicine

## 2012-10-18 ENCOUNTER — Encounter: Payer: Self-pay | Admitting: Internal Medicine

## 2012-10-18 VITALS — BP 122/68 | HR 65 | Temp 97.4°F | Resp 12 | Ht 63.0 in | Wt 138.0 lb

## 2012-10-18 DIAGNOSIS — D352 Benign neoplasm of pituitary gland: Secondary | ICD-10-CM

## 2012-10-18 DIAGNOSIS — E2749 Other adrenocortical insufficiency: Secondary | ICD-10-CM

## 2012-10-18 DIAGNOSIS — E2740 Unspecified adrenocortical insufficiency: Secondary | ICD-10-CM

## 2012-10-18 NOTE — Patient Instructions (Addendum)
Please return next Wednesday, June 4th, for blood work. Take the second hydrocortisone dose at 3 or 4 pm instead of bedtime. DO NOT take the hydrocortisone the night before labs.  DO NOT take the Hydrocortisone in the morning of the lab draw, too, but bring it with you and take it right after blood draw.  If you cannot keep anything down (vomitting, diarrhea), you will need to go to the closest urgent care or emergency room to get hydrocortisone in the vein. If you have fever, double the dose of your Hydrocortisone for the duration of the fever. Please try to get a MedAlert bracelet or pendant mentioning: "Adrenal Insufficiency"

## 2012-10-18 NOTE — Progress Notes (Signed)
Subjective:     Patient ID: [name] [name], female   DOB: May 31, 1945, 67 y.o.   MRN: [id number]  HPI [name] [name] is a 67 y/o woman, referred by her PCP, [name], for evaluation and management of adrenal insufficiency and pituitary microadenoma. She is here with her husband, who offers part of the history.   She has a complicated PMH, significant for arachnoiditis of her spine, on chronic methadone tx. She was started on methadone 15 years ago. Even on methadone, she cannot function or walk well as she cannot feel bottom of her feet. [name] is managing her Methadone, trying to taper her down. She failed multiple meds and also ESI and sympathetic block. She also had multiple surgeries and fusion. She is unable to afford going to a pain clinic. She has PT at home; uses quad cane.   She was found to have adrenal insufficiency during a hospitalization in 07/2012 - for seizures, unresponsiveness. They mention that she has a h/o frequent falls and before the hospitalization, she had fallen and hit her head. Since then she was more confused, and, while in the hospital, she had hypotension, bradycardia, hypothermia. At that time, her HCTZ - lisinopril was stopped and she required pressors for a short time. A 2D ECHO showed an EF of 55-60%, mild MR. She also had AKI, with Cr increasing from 0.64 to 1.66. She was found to have enterococcal UTI (tx with Nitrofurantoin). She denied OD on Methadone An MRI head showed a possible 5 mm pituitary microadenoma in R pituitary.   During the above hospitalization, pt had a cortisol level checked on 07/27/2012 at 2 am: 2.1. Based on this, she was considered adrenally insufficient and started on solucortef. She was d/c'd on hydrocortisone (HC) 10 mg at 8 am and 5 mg at 8 pm (!). She cannot feel a difference, but BP has been stable.  She has dizziness, which started before her last hospitalization; no AP; had double vision after she hit her head >2 mo ago (did not see an  ophthalmologist as she cannot afford it); she falls and hits her head. She also lost 30 lbs in 2 mo before last hospitalization, gained 2 lbs since then. No more falls. She cannot sleep at night and sleeps during the day.   [name] initiated an investigation for her pituitary microadenoma: 07/27/2012: TSH 1.246, previously 0.491 08/25/2012: free T4 0.75 (0.6-1.6)  TT3 118.6 (80-204)  PRL 9.7  IGF1 103 Pt had 2 repeated cortisol values, which were normal but I believe these have been drawn while on HC.  No FH of pituitary disorders, adrenal insufficiency, autoimmune ds.  Review of Systems Constitutional: + both weight gain/loss (see HPI), + fatigue, no subjective hyperthermia/hypothermia Eyes: no blurry vision, no xerophthalmia ENT: no sore throat, no nodules palpated in throat, no dysphagia/odynophagia, no hoarseness Cardiovascular: no CP/SOB/palpitations/leg swelling Respiratory: no cough/SOB Gastrointestinal: no N/V/D/C Musculoskeletal: no muscle/joint aches Skin: no rashes Neurological: no tremors/numbness/tingling/+ dizziness, dysequillibrium Psychiatric: no depression/+ anxiety, panic attacks  Past Medical History  Diagnosis Date  . Arachnoiditis 1970    due to her Pantopaque myelograms in the 1970s, evidence of this on CT scan 11/2002  . Hypertension   . MVP (mitral valve prolapse)     h/o scarlet fever  . COPD (chronic obstructive pulmonary disease) 2003    by CXR, chronic bronchitis  . HLD (hyperlipidemia)   . Colon polyps   . H/O: CVA (cerebrovascular accident)     TIA  .  Urine incontinence   . Anxiety     severe with panic attacks  . CAD (coronary artery disease) 2002    catheterization: 60% RCA stenosis, LAD 30%, L circ 25%  . Hydradenitis 2001    chronic, perineal s/p surgeries ([facility]) ?sweet's syndrome  . Psychogenic syncope 2003  . Orthostatic hypotension 2002  . DDD (degenerative disc disease), lumbar 2003    mult HNP with radiculopathy s/p mult  surgeries and fusion  . Chronic pain syndrome     high dose methadone since hospitalization 05/16/2002, has not tolerated lyrica, cymbalta, zanaflex  . Chronic back pain     failed ESI and sympathetic blocks  . Osteoarthritis of both knees     severe  . Aspiration pneumonia 02/2012  . 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 Laminectomy  1976,1977,1999,2003    x 4  . Abdominal hysterectomy  1985  . Appendectomy  1963  . Breast biopsy  1966  . Cardiac catheterization  2004  . Lumbar fusion  2003    L4/5 laminotomy with posterior lumbar interbody fusion ([facility])  . Knee arthroscopy Left     Erik Obey   History   Social History  . Marital Status: Married    Spouse Name: N/A    Number of Children: N/A   Occupational History  . Not on file.   Social History Main Topics  . Smoking status: Current Every Day Smoker -- 0.25 packs/day    Types: Cigarettes  . Smokeless tobacco: Never Used  . Alcohol Use: No  . Drug Use: No   Social History Narrative   Lives with husband and cousin - [place].  Has 6 rescued cats at home.   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 albuterol (PROVENTIL HFA;VENTOLIN HFA) 108 (90 BASE) MCG/ACT inhaler Inhale 2 puffs into the lungs every 6 (six) hours as needed for wheezing.  Marland Kitchen ALPRAZolam (XANAX) 1 MG tablet Take 1 tablet (1 mg total) by mouth 4 (four) times daily as needed for anxiety.  Marland Kitchen aspirin 81 MG chewable tablet Chew 1 tablet (81 mg total) by mouth daily.  . furosemide (LASIX) 20 MG tablet Take 20 mg by mouth daily as needed (swelling).   . gabapentin (NEURONTIN) 300 MG capsule Take 300-1,200 mg by  mouth 2 (two) times daily. 300 mg at lunch and 1200 mg at bedtime  . hydrocortisone (CORTEF) 5 MG tablet Take 2 tabs at 8Am and 1 Tab at 8PM.  . methadone (DOLOPHINE) 10 MG tablet Take 3 tablets (30 mg total) by mouth every 8 (eight) hours as needed for pain.  . Multiple Vitamin (MULTIVITAMIN WITH MINERALS) TABS Take 1 tablet by mouth daily.  . naproxen (NAPROSYN) 500 MG tablet Take one po bid x 1 week then prn pain, take with food  . [DISCONTINUED] diphenhydrAMINE (BENADRYL) 25 MG tablet Take 25 mg by mouth at bedtime as needed. For allergy symptoms   Allergies  Allergen Reactions  . Penicillins Anaphylaxis  . Sulfa Antibiotics Anaphylaxis  . Sertraline Hcl Other (See Comments)    Caused pt to be hyper and confused  . Beta Adrenergic Blockers     Bradycardia,  orthostatic hypotension  . Other     ALL ANTI DEPRESSANTS; MUSHROOMS  . Toradol (Ketorolac Tromethamine) Nausea And Vomiting  . Versed (Midazolam)     DID NOT WORK  . Codeine Itching and Rash   Family History  Problem Relation Age of Onset  . Arthritis    . CAD Mother     MI  . CAD Father     MI s/p 3v CABG  . Kidney failure Father     ESRD on HD  . Stroke Mother   . Cancer Neg Hx   . Diabetes Neg Hx   . Deep vein thrombosis Mother   . Deep vein thrombosis Father   . Deep vein thrombosis Paternal Grandfather       Objective:   Physical Exam BP 122/68  Pulse 65  Temp(Src) 97.4 F (36.3 C) (Oral)  Resp 12  Ht 5\' 3"  (1.6 m)  Wt 138 lb (62.596 kg)  BMI 24.45 kg/m2  SpO2 96% Wt Readings from Last 3 Encounters:  10/18/12 138 lb (62.596 kg)  09/28/12 135 lb (61.236 kg)  09/14/12 135 lb 4 oz (61.349 kg)  Constitutional: normal weight, in NAD, very unsure on her feet, walks with a cane, needed assistance from 2 people when got up on examiner's table Eyes: PERRLA, EOMI, no exophthalmos ENT: moist mucous membranes, no thyromegaly, no cervical lymphadenopathy Cardiovascular: RRR, + SEM 2/6, no RG Respiratory: CTA  B Gastrointestinal: abdomen soft, NT, ND, BS+ Musculoskeletal: no deformities, strength intact in all 4 Skin: moist, warm, no rashes Neurological: mild tremor with outstretched hands, DTR normal in all 4; Visual fields intact by confrontation except upper quadrants since she has mild palpebral ptosis    Assessment:     1. Adrenal insufficiency - not a proven dx - on HC 10 and 5   2. 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 Adrenal insufficiency - this is not a proven dx for the pt, as it was based on a cortisol level of 2 at 2 am. This was likely inappropriately low since she was hospitalized then, but is not definitive. A cortisol of <3 at 8 am would have qualified her for the dx.  - She has 2 possible reasons for AI: her pituitary microadenoma, and more likely, her chronic methadone tx. This has been linked with central adrenal insufficiency and can also cause blunting of a cosyntropin stimulation test. Alternatively, she might have primary AI, although not as likely as the above.  - I encouraged her to continue the [facility], however, the second dose of the day should never be later than 6 pm, best taken at 3-4 pm. - I also advised her to: If you cannot keep anything down (vomiting, diarrhea), you will need to go to the closest urgent care or emergency room to get hydrocortisone in the vein. If you have fever, double the dose of your Hydrocortisone for the duration of the fever. Please try to get a MedAlert bracelet or pendant mentioning: "Adrenal Insufficiency" - I asked her to return for a cosyntropin stimulation test:  if 1h cortisol low (<~18), she has primary or secondary adrenal insufficiency.   If 1h cortisol normal, she does not have primary AI, but may still have secondary AI  If 30 min aldosterone is low (<10), she has  primary AI If the above test is normal, she may still have secondary AI depending on the cortisol level. The best test to establish whether she has secondary AI or not is an Insulin Tolerance Test, however, this is contraindicated if pt has a h/o seizure - she had this per husband's history, but unclear (or with heart ds), and we also do not do this in the office. I can refer them to a center that does this ([facility], [facility], [facility]?).  2. Pituitary microadenoma - thyroid labs, PRL, IGF1 normal. We will test the pit.-adrenal axis as described above. The only labs left to check are part of the pit.-ovarian axis. I added an FSH, LH and estradiol to the next lab draw, but I wonder whether these will be useful as methadone can influence sex hormones, too (decrease).  10/30/2012: stress test pending

## 2012-10-19 ENCOUNTER — Other Ambulatory Visit: Payer: Self-pay | Admitting: *Deleted

## 2012-10-19 MED ORDER — ALPRAZOLAM 1 MG PO TABS: 1.0000 mg | ORAL_TABLET | Freq: Four times a day (QID) | ORAL | Status: DC | PRN

## 2012-10-19 NOTE — Telephone Encounter (Signed)
Last filled 09/19/12 

## 2012-10-19 NOTE — Telephone Encounter (Signed)
plz phone in. 

## 2012-10-20 NOTE — Telephone Encounter (Signed)
Pt called for status of refill.Medication phoned to [facility] as instructed. Pt notified refill done.

## 2012-10-22 ENCOUNTER — Encounter: Payer: Self-pay | Admitting: Family Medicine

## 2012-10-25 ENCOUNTER — Other Ambulatory Visit: Payer: Self-pay

## 2012-10-25 ENCOUNTER — Telehealth: Payer: Self-pay | Admitting: Family Medicine

## 2012-10-25 ENCOUNTER — Other Ambulatory Visit: Payer: Medicare Other

## 2012-10-25 ENCOUNTER — Encounter: Payer: Self-pay | Admitting: Family Medicine

## 2012-10-25 LAB — CORTISOL, URINE, 24 HOUR: RESULTS RECEIVED: 0.56 g/(24.h) — ABNORMAL LOW (ref 0.63–2.50)

## 2012-10-25 MED ORDER — METHADONE HCL 10 MG PO TABS: 30.0000 mg | ORAL_TABLET | Freq: Three times a day (TID) | ORAL | Status: DC | PRN

## 2012-10-25 NOTE — Telephone Encounter (Signed)
Already provided.

## 2012-10-25 NOTE — Telephone Encounter (Signed)
Pt left v/m requesting rx methadone to be picked up on 10/27/12.call when ready for pickup.

## 2012-10-25 NOTE — Telephone Encounter (Signed)
Requests to pick up scripts of controlled substances while here for lab visit today.

## 2012-10-27 ENCOUNTER — Encounter: Payer: Self-pay | Admitting: Family Medicine

## 2012-10-30 ENCOUNTER — Ambulatory Visit: Payer: Medicare Other | Admitting: Internal Medicine

## 2012-10-30 ENCOUNTER — Encounter: Payer: Self-pay | Admitting: Family Medicine

## 2012-11-01 ENCOUNTER — Ambulatory Visit: Payer: Medicare Other | Admitting: Internal Medicine

## 2012-11-01 ENCOUNTER — Ambulatory Visit (INDEPENDENT_AMBULATORY_CARE_PROVIDER_SITE_OTHER): Payer: Medicare Other | Admitting: Internal Medicine

## 2012-11-01 ENCOUNTER — Encounter: Payer: Self-pay | Admitting: Internal Medicine

## 2012-11-01 VITALS — BP 122/74 | HR 51 | Temp 97.4°F | Resp 12 | Ht 63.0 in | Wt 133.0 lb

## 2012-11-01 DIAGNOSIS — F419 Anxiety disorder, unspecified: Secondary | ICD-10-CM

## 2012-11-01 DIAGNOSIS — R609 Edema, unspecified: Secondary | ICD-10-CM

## 2012-11-01 DIAGNOSIS — F411 Generalized anxiety disorder: Secondary | ICD-10-CM

## 2012-11-01 DIAGNOSIS — R600 Localized edema: Secondary | ICD-10-CM

## 2012-11-01 NOTE — Patient Instructions (Signed)
Please return in 6 months. We will discuss your results over the phone.

## 2012-11-01 NOTE — Progress Notes (Signed)
Subjective:     Patient ID: [name] [name], female   DOB: 09-05-1945, 67 y.o.   MRN: [id number]  HPI [name] [name] is a 67 y/o woman, initially seen 2 weeks ago for adrenal insufficiency and pituitary microadenoma. She now comes for an acute appt for increased panic attacks, increased pain and swelling in hands and legs. She is here with her husband, who offers part of the history.   Patient complains having anxiety attacks: 30 min-1h (or sometimes longer) 2-3 x a day. She is on long-term treatment with methadone, and her PCP, [name] is tapering her down, with the most recent taper being from 200 mg a day  (stopped a month ago) to 80 mg now. Since the decreasing dose, she started to have  more frequent panic attacks, and they are affecting her life. The  back and leg pain is much worse, feels it is unbearable, falling all the time. She is tearful in the exam room when she tells me about it. She cannot go out at all, cannot even walk through the grocery store. She also started to have swelling in ankles and hands with the withdrawal,  this has improved recently, but still not back to normal.  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 She was started on methadone 15 years ago. Even on methadone, she cannot function or walk well as she cannot feel bottom of her feet. She failed multiple meds and also ESI and sympathetic block. She also had multiple surgeries and fusion. She is unable to afford going to a pain clinic. She has PT at home; uses quad cane.   She was found to have adrenal insufficiency during a hospitalization in 07/2012 - for seizures, unresponsiveness. 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 The adrenal insufficiency diagnosis is not very well established, and we are in the process of confirming it. If this turns out to be her case, it is more likely that the cause is methadone use  rather than her pituitary microadenoma.  I reviewed pt's medications, allergies, PMH, social hx, family hx and no changes required, except as mentioned above.  Review of Systems Constitutional: + weight loss, + fatigue, no subjective hyperthermia/hypothermia Eyes: no blurry vision, no xerophthalmia ENT: no sore throat, no nodules palpated in throat, no dysphagia/odynophagia, no hoarseness Cardiovascular: no CP/SOB/palpitations/leg swelling Respiratory: no cough/SOB Gastrointestinal: no N/V/D/C Musculoskeletal: no muscle/joint aches Skin: no rashes Neurological: no tremors/numbness/tingling/+ dizziness, dysequilibrium, frequent falls  Psychiatric: panic attacks  Objective:   Physical Exam BP 122/74  Pulse 51  Temp(Src) 97.4 F (36.3 C) (Oral)  Resp 12  Ht 5\' 3"  (1.6 m)  Wt 133 lb (60.328 kg)  BMI 23.57 kg/m2  SpO2 96% Wt Readings from Last 3 Encounters:  11/01/12 133 lb (60.328 kg)  10/18/12 138 lb (62.596 kg)  09/28/12 135 lb (61.236 kg)  Constitutional: normal weight, in NAD, very unsure on her feet, walks with a cane Eyes: PERRLA, EOMI, no exophthalmos ENT: moist mucous membranes, no thyromegaly, no cervical lymphadenopathy Cardiovascular: RRR, + SEM 2/6, no RG, mild periankle edema, no increased swelling in hands Respiratory: CTA B Gastrointestinal: abdomen soft, NT, ND, BS+ Musculoskeletal: no deformities, strength intact in all 4 Skin: moist, warm, no rashes Neurological: mild tremor with outstretched hands, DTR normal in all 4;  Assessment:     1. Adrenal insufficiency - not a proven dx yet - on  HC 10 and 5   2. 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 Panic attacks - Possibly related to methadone taper   4. Extremity swelling     Plan:     1. Adrenal insufficiency and 2. Pituitary microadenoma - Not  addressed at this visit, we are in the process of obtaining a cosyntropin stimulation test to confirm her adrenal insufficiency   - thyroid labs, PRL, IGF1 normal.   3. Panic attacks  - Increased recently, after tapering down her methadone - since they are associated with increased back pain, they might be related to the methadone taper.  - She does have an appointment with [name] in 6 days, and I advised her to keep this appointment.  - I doubt that her panic attacks are due to her steroids, since they accentuated after she decreased the dose of her methadone and the dose of steroids is physiologic  4. Upper and lower extremity swelling - It is possible that this can be associated with hydrocortisone use, due to the mineralocorticoid effect, however I would not expect to see this with such a low hydrocortisone dose.  - This has recently improved

## 2012-11-07 ENCOUNTER — Ambulatory Visit (INDEPENDENT_AMBULATORY_CARE_PROVIDER_SITE_OTHER): Payer: Medicare Other | Admitting: Family Medicine

## 2012-11-07 ENCOUNTER — Encounter: Payer: Self-pay | Admitting: Family Medicine

## 2012-11-07 VITALS — BP 110/76 | HR 84 | Temp 98.8°F | Wt 130.0 lb

## 2012-11-07 DIAGNOSIS — G8929 Other chronic pain: Secondary | ICD-10-CM

## 2012-11-07 DIAGNOSIS — F419 Anxiety disorder, unspecified: Secondary | ICD-10-CM

## 2012-11-07 DIAGNOSIS — E2740 Unspecified adrenocortical insufficiency: Secondary | ICD-10-CM

## 2012-11-07 DIAGNOSIS — E2749 Other adrenocortical insufficiency: Secondary | ICD-10-CM

## 2012-11-07 DIAGNOSIS — M549 Dorsalgia, unspecified: Secondary | ICD-10-CM

## 2012-11-07 DIAGNOSIS — F411 Generalized anxiety disorder: Secondary | ICD-10-CM

## 2012-11-07 NOTE — Patient Instructions (Signed)
I think you are doing well today. We will increase methadone to #300 per month (increase in 20 per month).  Let me know when you are running low (should be around July 2nd/3rd) - we want to get you on a schedule for filling on the 3rd of each month. Return to see me in 3 months for follow up, sooner if needed.

## 2012-11-07 NOTE — Assessment & Plan Note (Signed)
Appreciate excellent endo care.  Pending cosyntropin test tomorrow.  Pt aware to arrive to office at 8am. Actually possible insufficiency due to chronic methadone use.

## 2012-11-07 NOTE — Progress Notes (Signed)
Subjective:    Patient ID: [name] [name], female    DOB: [birth year], 67 y.o.   MRN: [id number]  HPI CC: f/u   [name] [name] presents today as 1 month follow up.  Continued leg pain - having to take extra methadone pill nightly about 4-5 times per week, due to breakthrough pain.  [name] [name] has been on chronic methadone for years.  She has previously been on 200mg  methadone per day, however since December of 2013, she has been receiving #280 pills per month which is equivalent of 90mg  daily.  She established with our office in March 2014 and I continued the dose she had been previously taking, namely 90mg  daily (#280 pills/month).  We did discuss slow taper of methadone dose, however I had not started taper but rather continued at her previous maintenance dose.  Anxiety - not doing well since 1 year anniversary of sister's death.  Feels this has exacerbated her anxiety issues. Takes extra xanax intermittently at night to help her sleep.  Pending cosyntropin stim test tomorrow by endo.  Past Medical History  Diagnosis Date  . Arachnoiditis 1970    due to her Pantopaque myelograms in the 1970s, evidence of this on CT scan 11/2002  . Hypertension   . MVP (mitral valve prolapse)     h/o scarlet fever  . COPD (chronic obstructive pulmonary disease) 2003    by CXR, chronic bronchitis  . HLD (hyperlipidemia)   . Colon polyps   . H/O: CVA (cerebrovascular accident)     TIA  . Urine incontinence   . Anxiety     severe with panic attacks  . CAD (coronary artery disease) 2002    catheterization: 60% RCA stenosis, LAD 30%, L circ 25%  . Hydradenitis 2001    chronic, perineal s/p surgeries ([facility]) ?sweet's syndrome  . Psychogenic syncope 2003  . Orthostatic hypotension 2002  . DDD (degenerative disc disease), lumbar 2003    mult HNP with radiculopathy s/p mult surgeries and fusion  . Chronic pain syndrome     high dose methadone since hospitalization 05/16/2002, has not tolerated lyrica,  cymbalta, zanaflex  . Chronic back pain     failed ESI and sympathetic blocks  . Osteoarthritis of both knees     severe  . Aspiration pneumonia 02/2012    after overdose  . 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 Adenomatous polyp 04/2008    rec rpt colon 3 yrs ([facility])  . 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 Foot fracture, right 2012    ?nonunion     Review of Systems Per HPI    Objective:   Physical Exam  Nursing note and vitals reviewed. Constitutional: She appears well-developed and well-nourished. No distress.  Well kempt  HENT:  Head: Normocephalic and atraumatic.  Mouth/Throat: Oropharynx is clear and moist. No oropharyngeal exudate.  Eyes: Conjunctivae and EOM are normal. Pupils are equal, round, and reactive to light.  Cardiovascular: Normal rate, regular rhythm, normal heart sounds and intact distal pulses.   No murmur heard. Pulmonary/Chest: Effort normal and breath sounds normal. No respiratory distress. She has no wheezes. She has no rales.  Musculoskeletal: She exhibits no edema.  Skin: Skin is warm and dry. No rash noted.  Psychiatric: She has a normal mood and affect.  Calm and collected today. Pleasant.       Assessment &  Plan:

## 2012-11-07 NOTE — Assessment & Plan Note (Addendum)
Multifactorial - due to chronic arachnoiditis as well as known HNP with lumbar radiculopathy. Chronic methadone treatment. Unable to taper 2/2 breakthrough pain - we have decided to increase her # of methadone 10mg  pills from 280/mo to 300/mo - in hopes of better pain control.  Next refill will be around July 1st or 2nd for #300 methadone 10mg  pills.  Goal to get both narcotic and xanax on cycle to fill on 3rd of each month (that is when SS check comes in). rtc 83mo for f/u, sooner if needed.

## 2012-11-07 NOTE — Assessment & Plan Note (Signed)
Doubt related to methadone taper as there has been no actual taper since 04/2012. Anticipate more related to 1 yr anniversary of sister's death last month as well as some current family stress with her brother. Pt inquired about increase in xanax to 2mg  - I suggested she rather try taking 2 pills at bedtime instead of separate PM dose and QHS dose.

## 2012-11-08 ENCOUNTER — Other Ambulatory Visit (INDEPENDENT_AMBULATORY_CARE_PROVIDER_SITE_OTHER): Payer: Medicare Other

## 2012-11-08 ENCOUNTER — Telehealth: Payer: Self-pay

## 2012-11-08 DIAGNOSIS — E2749 Other adrenocortical insufficiency: Secondary | ICD-10-CM

## 2012-11-08 DIAGNOSIS — D352 Benign neoplasm of pituitary gland: Secondary | ICD-10-CM

## 2012-11-08 DIAGNOSIS — I10 Essential (primary) hypertension: Secondary | ICD-10-CM

## 2012-11-08 DIAGNOSIS — N179 Acute kidney failure, unspecified: Secondary | ICD-10-CM | POA: Insufficient documentation

## 2012-11-08 DIAGNOSIS — D353 Benign neoplasm of craniopharyngeal duct: Secondary | ICD-10-CM

## 2012-11-08 DIAGNOSIS — E2740 Unspecified adrenocortical insufficiency: Secondary | ICD-10-CM

## 2012-11-08 LAB — BASIC METABOLIC PANEL
CO2: 28 mEq/L (ref 19–32)
Chloride: 99 mEq/L (ref 96–112)
Sodium: 141 mEq/L (ref 135–145)

## 2012-11-08 LAB — CORTISOL
Cortisol, Plasma: 11 ug/dL
Cortisol, Plasma: 20.3 ug/dL
Cortisol, Plasma: 22.9 ug/dL

## 2012-11-08 LAB — FOLLICLE STIMULATING HORMONE: FSH: 31.2 m[IU]/mL

## 2012-11-08 MED ORDER — COSYNTROPIN 0.25 MG IJ SOLR: 0.2500 mg | Freq: Once | INTRAMUSCULAR | Status: DC

## 2012-11-08 NOTE — Telephone Encounter (Addendum)
I received this message late today. Please notify patient - kidney function returned significantly impaired, unclear why.  Has she had any diarrhea or trouble voiding or new abdominal pains? I want her to increase water intake, and decrease gabapentin to one 300mg  once daily. Also ensure she has stopped previously prescribed naprosyn or other anti inflammatories. I want her to return on Thursday for recheck of kidney function - if staying abnormal will need further evaluation/blood work.

## 2012-11-08 NOTE — Addendum Note (Signed)
Addended by: Alvina Chou on: 11/08/2012 10:19 AM   Modules accepted: Orders

## 2012-11-08 NOTE — Telephone Encounter (Signed)
Pt left v/m pt was not able to sleep last night due to pain (?back and leg pain); pt cannot go thru another sleepless night. Sheliah Plane.Please advise.

## 2012-11-09 ENCOUNTER — Other Ambulatory Visit (INDEPENDENT_AMBULATORY_CARE_PROVIDER_SITE_OTHER): Payer: Medicare Other

## 2012-11-09 DIAGNOSIS — N179 Acute kidney failure, unspecified: Secondary | ICD-10-CM

## 2012-11-09 LAB — RENAL FUNCTION PANEL
Creatinine, Ser: 2.1 mg/dL — ABNORMAL HIGH (ref 0.4–1.2)
GFR: 25.6 mL/min — ABNORMAL LOW (ref >60.00)
Glucose, Bld: 75 mg/dL (ref 70–99)
Sodium: 140 mEq/L (ref 135–145)

## 2012-11-09 LAB — ACTH: C206 ACTH: 16 pg/mL (ref 10–46)

## 2012-11-09 NOTE — Telephone Encounter (Signed)
Spoke with patient. She says she is not taking naprosyn or other anti-inflammatories. She is more concerned about what to do about her pain. I advised we needed to address her kidney function before anything can be done about her pain. Lab appt scheduled for today. I advised I would call her with results and advise what to do about her pain after you get her results.

## 2012-11-09 NOTE — Telephone Encounter (Signed)
Attempted to call patient. No answer, no machine on her number. Husbands number disconnected. Will try again later.

## 2012-11-10 ENCOUNTER — Telehealth: Payer: Self-pay

## 2012-11-10 ENCOUNTER — Ambulatory Visit: Payer: Medicare Other

## 2012-11-10 DIAGNOSIS — N179 Acute kidney failure, unspecified: Secondary | ICD-10-CM

## 2012-11-10 LAB — CBC WITH DIFFERENTIAL/PLATELET
Basophils Absolute: 0 10*3/uL (ref 0.0–0.1)
Basophils Relative: 0 % (ref 0–1)
Eosinophils Relative: 4 % (ref 0–5)
HCT: 37.8 % (ref 36.0–46.0)
MCH: 29.5 pg (ref 26.0–34.0)
MCHC: 32.8 g/dL (ref 30.0–36.0)
MCV: 90 fL (ref 78.0–100.0)
Monocytes Absolute: 0.4 10*3/uL (ref 0.1–1.0)
RDW: 14.8 % (ref 11.5–15.5)

## 2012-11-10 NOTE — Telephone Encounter (Signed)
Pt's husband came by office for himself and then asked for his wife's lab results. Advised Mr Mannor courteously that I was not able to give him results because pt had not signed Designated party release form. Pt visibly upset and left office.

## 2012-11-12 ENCOUNTER — Emergency Department (HOSPITAL_COMMUNITY)
Admission: EM | Admit: 2012-11-12 | Discharge: 2012-11-12 | Discharge status: Against Medical Advice | Payer: Medicare Other | Attending: Emergency Medicine | Admitting: Emergency Medicine

## 2012-11-12 ENCOUNTER — Encounter (HOSPITAL_COMMUNITY): Payer: Self-pay | Admitting: Emergency Medicine

## 2012-11-12 DIAGNOSIS — I2510 Atherosclerotic heart disease of native coronary artery without angina pectoris: Secondary | ICD-10-CM | POA: Insufficient documentation

## 2012-11-12 DIAGNOSIS — Z8739 Personal history of other diseases of the musculoskeletal system and connective tissue: Secondary | ICD-10-CM | POA: Insufficient documentation

## 2012-11-12 DIAGNOSIS — Z8679 Personal history of other diseases of the circulatory system: Secondary | ICD-10-CM | POA: Insufficient documentation

## 2012-11-12 DIAGNOSIS — F17200 Nicotine dependence, unspecified, uncomplicated: Secondary | ICD-10-CM | POA: Insufficient documentation

## 2012-11-12 DIAGNOSIS — Z860100 Personal history of colon polyps, unspecified: Secondary | ICD-10-CM | POA: Insufficient documentation

## 2012-11-12 DIAGNOSIS — Z8701 Personal history of pneumonia (recurrent): Secondary | ICD-10-CM | POA: Insufficient documentation

## 2012-11-12 DIAGNOSIS — F411 Generalized anxiety disorder: Secondary | ICD-10-CM | POA: Insufficient documentation

## 2012-11-12 DIAGNOSIS — R5381 Other malaise: Secondary | ICD-10-CM | POA: Insufficient documentation

## 2012-11-12 DIAGNOSIS — M1710 Unilateral primary osteoarthritis, unspecified knee: Secondary | ICD-10-CM | POA: Insufficient documentation

## 2012-11-12 DIAGNOSIS — Z8673 Personal history of transient ischemic attack (TIA), and cerebral infarction without residual deficits: Secondary | ICD-10-CM | POA: Insufficient documentation

## 2012-11-12 DIAGNOSIS — R5383 Other fatigue: Secondary | ICD-10-CM | POA: Insufficient documentation

## 2012-11-12 DIAGNOSIS — J4489 Other specified chronic obstructive pulmonary disease: Secondary | ICD-10-CM | POA: Insufficient documentation

## 2012-11-12 DIAGNOSIS — Z8781 Personal history of (healed) traumatic fracture: Secondary | ICD-10-CM | POA: Insufficient documentation

## 2012-11-12 DIAGNOSIS — Z862 Personal history of diseases of the blood and blood-forming organs and certain disorders involving the immune mechanism: Secondary | ICD-10-CM | POA: Insufficient documentation

## 2012-11-12 DIAGNOSIS — Z880 Allergy status to penicillin: Secondary | ICD-10-CM | POA: Insufficient documentation

## 2012-11-12 DIAGNOSIS — R079 Chest pain, unspecified: Secondary | ICD-10-CM

## 2012-11-12 DIAGNOSIS — I10 Essential (primary) hypertension: Secondary | ICD-10-CM | POA: Insufficient documentation

## 2012-11-12 DIAGNOSIS — J449 Chronic obstructive pulmonary disease, unspecified: Secondary | ICD-10-CM | POA: Insufficient documentation

## 2012-11-12 DIAGNOSIS — Z8639 Personal history of other endocrine, nutritional and metabolic disease: Secondary | ICD-10-CM | POA: Insufficient documentation

## 2012-11-12 DIAGNOSIS — I059 Rheumatic mitral valve disease, unspecified: Secondary | ICD-10-CM | POA: Insufficient documentation

## 2012-11-12 DIAGNOSIS — R0789 Other chest pain: Secondary | ICD-10-CM | POA: Insufficient documentation

## 2012-11-12 DIAGNOSIS — Z872 Personal history of diseases of the skin and subcutaneous tissue: Secondary | ICD-10-CM | POA: Insufficient documentation

## 2012-11-12 DIAGNOSIS — E785 Hyperlipidemia, unspecified: Secondary | ICD-10-CM | POA: Insufficient documentation

## 2012-11-12 DIAGNOSIS — Z87448 Personal history of other diseases of urinary system: Secondary | ICD-10-CM | POA: Insufficient documentation

## 2012-11-12 DIAGNOSIS — IMO0002 Reserved for concepts with insufficient information to code with codable children: Secondary | ICD-10-CM | POA: Insufficient documentation

## 2012-11-12 DIAGNOSIS — G894 Chronic pain syndrome: Secondary | ICD-10-CM | POA: Insufficient documentation

## 2012-11-12 DIAGNOSIS — Z7982 Long term (current) use of aspirin: Secondary | ICD-10-CM | POA: Insufficient documentation

## 2012-11-12 DIAGNOSIS — Z9889 Other specified postprocedural states: Secondary | ICD-10-CM | POA: Insufficient documentation

## 2012-11-12 DIAGNOSIS — Z79899 Other long term (current) drug therapy: Secondary | ICD-10-CM | POA: Insufficient documentation

## 2012-11-12 DIAGNOSIS — Z8669 Personal history of other diseases of the nervous system and sense organs: Secondary | ICD-10-CM | POA: Insufficient documentation

## 2012-11-12 NOTE — ED Provider Notes (Signed)
History     CSN: [id number]  Arrival date & time 11/12/12  9629   First MD Initiated Contact with Patient 11/12/12 0719      Chief Complaint  Patient presents with  . Chest Pain    (Consider location/radiation/quality/duration/timing/severity/associated sxs/prior treatment) HPI Comments: Pt came in with cc of chest pain. Left without being seen by Physician - told nurse she has to smoke a cigarette.  Per EMS, pt was awoken from sleep this morning at approx 0500 with chest pain. Pt reports pain is in the center of her chest and 10/10 non-radiating. Pt reports pain is worse on inspiration and palpation. Pt also complaining of generalized weakness. Pt has had 1 nitro en route to ED and 1 324asa. Pt experienced no relief from EMS interventions. Pt denies sob, nausea and vomiting. VSS. BP 116/82 HR 58 RR 16  Chart review indicates CAD with stenosis to RCA per 2002 CATH. Pt left prior to me seeing her.  The history is provided by the EMS personnel.    Past Medical History  Diagnosis Date  . Arachnoiditis 1970    due to her Pantopaque myelograms in the 1970s, evidence of this on CT scan 11/2002  . Hypertension   . MVP (mitral valve prolapse)     h/o scarlet fever  . COPD (chronic obstructive pulmonary disease) 2003    by CXR, chronic bronchitis  . HLD (hyperlipidemia)   . Colon polyps   . H/O: CVA (cerebrovascular accident)     TIA  . Urine incontinence   . Anxiety     severe with panic attacks  . CAD (coronary artery disease) 2002    catheterization: 60% RCA stenosis, LAD 30%, L circ 25%  . Hydradenitis 2001    chronic, perineal s/p surgeries ([facility]) ?sweet's syndrome  . Psychogenic syncope 2003  . Orthostatic hypotension 2002  . DDD (degenerative disc disease), lumbar 2003    mult HNP with radiculopathy s/p mult surgeries and fusion  . Chronic pain syndrome     high dose methadone since hospitalization 05/16/2002, has not tolerated lyrica, cymbalta, zanaflex  .  Chronic back pain     failed ESI and sympathetic blocks  . Osteoarthritis of both knees     severe  . Aspiration pneumonia 02/2012    after overdose  . 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 Adenomatous polyp 04/2008    rec rpt colon 3 yrs ([facility])  . 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 Foot fracture, right 2012    ?nonunion    Past Surgical History  Procedure Laterality Date  . Laminectomy  1976,1977,1999,2003    x 4  . Abdominal hysterectomy  1985  . Appendectomy  1963  . Breast biopsy  1966  . Cardiac catheterization  2004  . Lumbar fusion  2003    L4/5 laminotomy with posterior lumbar interbody fusion ([facility])  . Knee arthroscopy Left     [facility]  . Colonoscopy  04/2008    tubular adenoma, int hem, rec rpt 3 yrs - Dr. Bosie Clos with [facility]    Family History  Problem Relation Age of Onset  . Arthritis    . CAD Mother     MI  . CAD Father     MI s/p 3v CABG  . Kidney failure Father     ESRD on HD  . Stroke  Mother   . Cancer Neg Hx   . Diabetes Neg Hx   . Deep vein thrombosis Mother   . Deep vein thrombosis Father   . Deep vein thrombosis Paternal Grandfather     History  Substance Use Topics  . Smoking status: Current Every Day Smoker -- 0.25 packs/day    Types: Cigarettes  . Smokeless tobacco: Never Used  . Alcohol Use: No    OB History   Grav Para Term Preterm Abortions TAB SAB Ect Mult Living                  Review of Systems  Allergies  Penicillins; Sulfa antibiotics; Sertraline hcl; Beta adrenergic blockers; Other; Toradol; Versed; and Codeine  Home Medications   Current Outpatient Rx  Name  Route  Sig  Dispense  Refill  . albuterol (PROVENTIL HFA;VENTOLIN HFA) 108 (90 BASE) MCG/ACT inhaler   Inhalation   Inhale 2 puffs into the lungs every 6 (six) hours as needed for wheezing.   1  Inhaler   2   . ALPRAZolam (XANAX) 1 MG tablet   Oral   Take 1 tablet (1 mg total) by mouth 4 (four) times daily as needed for anxiety.   120 tablet   0   . aspirin 81 MG chewable tablet   Oral   Chew 1 tablet (81 mg total) by mouth daily.         . furosemide (LASIX) 20 MG tablet   Oral   Take 20 mg by mouth daily as needed (swelling).          . gabapentin (NEURONTIN) 300 MG capsule   Oral   Take 300-1,200 mg by mouth See admin instructions. 300 mg at lunch and 1200 mg at bedtime         . hydrocortisone (CORTEF) 5 MG tablet   Oral   Take 5-10 mg by mouth See admin instructions. Take 2 tabs at 8Am and 1 Tab at 3PM.         . methadone (DOLOPHINE) 10 MG tablet   Oral   Take 30 mg by mouth every 8 (eight) hours as needed for pain.         . Multiple Vitamin (MULTIVITAMIN WITH MINERALS) TABS   Oral   Take 1 tablet by mouth daily.           BP 142/72  Pulse 50  Temp(Src) 98.4 F (36.9 C) (Oral)  Resp 16  SpO2 99%  Physical Exam  ED Course  Procedures (including critical care time)  Labs Reviewed - No data to display No results found.   No diagnosis found.    MDM  Differential diagnosis includes: ACS syndrome CHF exacerbation Dissection COPD exacerbation Valvular disorder Myocarditis Pericarditis Pericardial effusion Pneumonia Pleural effusion Pulmonary edema PE Anemia Musculoskeletal pain  Pt comes in with cc of chest pain, woke her up from sleep. She has hx of CAD, COPD - no cardiac interventions.  Before i saw the patient - she left.  Derwood Kaplan, MD 11/14/12 [phone]

## 2012-11-12 NOTE — ED Notes (Signed)
Left patients room went back approximately 5 minutes later and patient had left AMA and was found sitting outside smoking. Ask patient if she was going to stay and she states "I am going home." Patient left [facility] ED provider notified.Charge nurse also notified.

## 2012-11-12 NOTE — Telephone Encounter (Signed)
See result notes. Plz call patient - I want to see her for office visit today or tomorrow to discuss kidneys and pain - appt.

## 2012-11-12 NOTE — ED Notes (Signed)
Per EMS, pt was awoken from sleep this morning at approx 0500 with chest pain. Pt reports pain is in the center of her chest and 10/10 non-radiating. Pt reports pain is worse on inspiration and palpation. Pt also complaining of generalized weakness. Pt has had 1 nitro en route to ED and 1 324asa. Pt experienced no relief from EMS interventions. Pt denies sob, nausea and vomiting. VSS. BP 116/82 HR 58 RR 16

## 2012-11-13 ENCOUNTER — Ambulatory Visit (INDEPENDENT_AMBULATORY_CARE_PROVIDER_SITE_OTHER): Payer: Medicare Other | Admitting: Family Medicine

## 2012-11-13 ENCOUNTER — Encounter: Payer: Self-pay | Admitting: Family Medicine

## 2012-11-13 ENCOUNTER — Other Ambulatory Visit: Payer: Self-pay | Admitting: Family Medicine

## 2012-11-13 VITALS — BP 154/96 | HR 84 | Temp 97.9°F | Wt 135.5 lb

## 2012-11-13 DIAGNOSIS — E2749 Other adrenocortical insufficiency: Secondary | ICD-10-CM

## 2012-11-13 DIAGNOSIS — R8290 Unspecified abnormal findings in urine: Secondary | ICD-10-CM

## 2012-11-13 DIAGNOSIS — G030 Nonpyogenic meningitis: Secondary | ICD-10-CM

## 2012-11-13 DIAGNOSIS — R82998 Other abnormal findings in urine: Secondary | ICD-10-CM

## 2012-11-13 DIAGNOSIS — A879 Viral meningitis, unspecified: Secondary | ICD-10-CM

## 2012-11-13 DIAGNOSIS — N179 Acute kidney failure, unspecified: Secondary | ICD-10-CM

## 2012-11-13 DIAGNOSIS — E2740 Unspecified adrenocortical insufficiency: Secondary | ICD-10-CM

## 2012-11-13 LAB — POCT URINALYSIS DIPSTICK
Bilirubin, UA: NEGATIVE
Glucose, UA: NEGATIVE
Nitrite, UA: NEGATIVE

## 2012-11-13 LAB — ALDOSTERONE: Aldosterone, Serum: 18 ng/dL

## 2012-11-13 MED ORDER — METHADONE HCL 10 MG PO TABS: 30.0000 mg | ORAL_TABLET | Freq: Three times a day (TID) | ORAL | Status: DC | PRN

## 2012-11-13 NOTE — Assessment & Plan Note (Addendum)
Discussed danger in suddenly stopping cortisone after several months of regular use. Has been off for 5days - I advised her to restart at previous dose. awaiting final labs to see if able to discontinue steroids. Currently denies SI or plan. Recent chest pain - ?due to stopping corticosteroid suddenly.

## 2012-11-13 NOTE — Assessment & Plan Note (Signed)
Anticipate leading to burning leg pain. Advised that once we've decided on pain regimen, I will not change that regimen over the phone.  She has no good options to treat acute pain flares - as intolerant of NSAIDs, tylenol, and refuses to try pain modulating medications such as TCAs and other antidepressants. In h/o concern for overdose, I do not want to significantly increase methadone use. She has run out early this month on her methadone - will provide with another script to last her until July 3rd.  Will restart monthly refill at that time.

## 2012-11-13 NOTE — Telephone Encounter (Signed)
Patient notified as instructed by telephone. Appointment scheduled today at 3:00.

## 2012-11-13 NOTE — Assessment & Plan Note (Addendum)
Unclear etiology of this acute renal failure - check FENa today as well as renal ultrasound, U microalb/cr ratio, and renal panel to further evaluate for cause of ARF. No recent lasix use. Encouraged decreased gabapentin 300-600mg  daily. UA checked today - small LE - will send culture.

## 2012-11-13 NOTE — Patient Instructions (Addendum)
Restart hydrocortisone - 2 pills in am and 1 pill in afternoon as you had been taking.  Stopping this medicine suddenly is not safe. If you have thoughts of hurting yourself or others, please call us or seek urgent care. Blood work today as well as urine checked today. Limit gabapentin to 2 pills daily. Hold lasix for now. Methadone refilled for another 10 days, next refill will be July 3rd.

## 2012-11-13 NOTE — Progress Notes (Addendum)
Subjective:    Patient ID: [name] [name], female    DOB: Mar 19, 1946, 67 y.o.   MRN: [id number]  HPI CC: 30 min appt f/u kidneys and pain  See prior note for details.  Seen here 11/07/2012 - at which time we decided on new pain regimen with increase in methadone from #280 to 300 per month.  Called back 1-2 days later with increased leg pain.  Evaluated at ER over weekend 2/2 chest pain, but left early prior to MD eval 2/2 "I wanted to smoke".  No EKG obtained.  Today without chest pain.  Last week, in evaluating for adrenal insufficiency, she was found to have acute renal failure with Cr up to 3.  On repeat blood work, creatinine dropped to 2.1.  Denies NSAID use, doesn't regularly use lasix prescribed.  Did not decrease gabapentin as requested.  She also stopped hydrocortisone suddenly - stopped this 5 days ago.  Aware this was dangerous.  "I stopped this because I wanted to go be with my sister".  Denies persistent SI. Lab Results  Component Value Date   CREATININE 2.1* 11/09/2012   She also took 2400mg  gabapentin despite request to decrease to 300mg  daily 2/2 ARF.  Endorses significant burning leg pain from hips to toes.  Denies back pain.  Ran out of methadone on 11/11/2012 - had increased amt she took daily 2/2 acute pain.  Endorses stooling daily, but recently more constipated.  Past Medical History  Diagnosis Date  . Arachnoiditis 1970    due to her Pantopaque myelograms in the 1970s, evidence of this on CT scan 11/2002  . Hypertension   . MVP (mitral valve prolapse)     h/o scarlet fever  . COPD (chronic obstructive pulmonary disease) 2003    by CXR, chronic bronchitis  . HLD (hyperlipidemia)   . Colon polyps   . H/O: CVA (cerebrovascular accident)     TIA  . Urine incontinence   . Anxiety     severe with panic attacks  . CAD (coronary artery disease) 2002    catheterization: 60% RCA stenosis, LAD 30%, L circ 25%  . Hydradenitis 2001    chronic, perineal s/p surgeries  ([facility]) ?sweet's syndrome  . Psychogenic syncope 2003  . Orthostatic hypotension 2002  . DDD (degenerative disc disease), lumbar 2003    mult HNP with radiculopathy s/p mult surgeries and fusion  . Chronic pain syndrome     high dose methadone since hospitalization 05/16/2002, has not tolerated lyrica, cymbalta, zanaflex  . Chronic back pain     failed ESI and sympathetic blocks  . Osteoarthritis of both knees     severe  . Aspiration pneumonia 02/2012    after overdose  . 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 Adenomatous polyp 04/2008    rec rpt colon 3 yrs ([facility])  . 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 Foot fracture, right 2012    ?nonunion    Review of Systems Per HPI    Objective:   Physical Exam  Nursing note and vitals reviewed. Constitutional: She appears well-developed and well-nourished. No distress.  Stiff movements, able to get onto exam table with assistance  HENT:  Mouth/Throat: Oropharynx is clear and moist. No oropharyngeal exudate.  Eyes: Conjunctivae and EOM are normal. Pupils are equal, round, and reactive to light.  Cardiovascular: Normal rate, regular rhythm, normal  heart sounds and intact distal pulses.   No murmur heard. Pulmonary/Chest: Effort normal and breath sounds normal. No respiratory distress. She has no wheezes. She has no rales.  Abdominal: Soft. Bowel sounds are normal. She exhibits no distension. There is no tenderness. There is no rebound and no guarding.  Musculoskeletal: She exhibits no edema.  Skin: Skin is warm and dry. No rash noted.       Assessment & Plan:

## 2012-11-14 ENCOUNTER — Ambulatory Visit
Admission: RE | Admit: 2012-11-14 | Discharge: 2012-11-14 | Disposition: A | Discharge status: Home / Self Care | Payer: Medicare Other | Source: Ambulatory Visit | Attending: Family Medicine | Admitting: Family Medicine

## 2012-11-14 ENCOUNTER — Encounter: Payer: Self-pay | Admitting: Family Medicine

## 2012-11-14 DIAGNOSIS — N179 Acute kidney failure, unspecified: Secondary | ICD-10-CM

## 2012-11-14 DIAGNOSIS — D3001 Benign neoplasm of right kidney: Secondary | ICD-10-CM | POA: Insufficient documentation

## 2012-11-14 LAB — RENAL FUNCTION PANEL
Albumin: 4 g/dL (ref 3.5–5.2)
BUN: 37 mg/dL — ABNORMAL HIGH (ref 6–23)
Chloride: 108 mEq/L (ref 96–112)
Glucose, Bld: 84 mg/dL (ref 70–99)
Phosphorus: 3.8 mg/dL (ref 2.3–4.6)

## 2012-11-14 LAB — MICROALBUMIN / CREATININE URINE RATIO: Microalb Creat Ratio: 2 mg/g (ref 0.0–30.0)

## 2012-11-14 LAB — SODIUM, URINE, RANDOM: Sodium, Ur: 88 mEq/L

## 2012-11-15 ENCOUNTER — Other Ambulatory Visit: Payer: Self-pay

## 2012-11-15 LAB — PROTEIN ELECTROPHORESIS, SERUM
Alpha-1-Globulin: 5.7 % — ABNORMAL HIGH (ref 2.9–4.9)
Alpha-2-Globulin: 12.9 % — ABNORMAL HIGH (ref 7.1–11.8)
Gamma Globulin: 15.7 % (ref 11.1–18.8)

## 2012-11-15 NOTE — Telephone Encounter (Signed)
Pt left v/m she has appt with Dr Elvera Lennox on 11/22/12 at [facility] and refills for alprazolam (to be called to [facility]) and written rx methadone are due on 11/22/12 and  Pt wants to pick up methadone rx on 11/22/12.Please advise.

## 2012-11-16 LAB — URINE CULTURE: Organism ID, Bacteria: NO GROWTH

## 2012-11-17 LAB — IMMUNOFIXATION ELECTROPHORESIS
IgM, Serum: 87 mg/dL (ref 52–322)
Total Protein, Serum Electrophoresis: 7.4 g/dL (ref 6.0–8.3)

## 2012-11-17 MED ORDER — ALPRAZOLAM 1 MG PO TABS: 1.0000 mg | ORAL_TABLET | Freq: Four times a day (QID) | ORAL | Status: DC | PRN

## 2012-11-17 NOTE — Telephone Encounter (Signed)
plz phone in alprazolam.  I will refill methadone when I return.

## 2012-11-20 LAB — ESTRADIOL, FREE
Estradiol, Free: <0.03 pg/mL
Estradiol: <2 pg/mL

## 2012-11-20 NOTE — Telephone Encounter (Signed)
Kim, this is in "previously signed orders" section of order entry.  Signed 11/15/2012

## 2012-11-20 NOTE — Telephone Encounter (Signed)
SIG for alprazolam please. Thanks!

## 2012-11-21 ENCOUNTER — Other Ambulatory Visit: Payer: Self-pay | Admitting: Family Medicine

## 2012-11-21 MED ORDER — METHADONE HCL 10 MG PO TABS: 30.0000 mg | ORAL_TABLET | Freq: Three times a day (TID) | ORAL | Status: DC | PRN

## 2012-11-21 NOTE — Telephone Encounter (Signed)
Rx placed up front for patient to pick up at appt tomorrow.

## 2012-11-21 NOTE — Telephone Encounter (Signed)
Rx called in as directed.   

## 2012-11-21 NOTE — Telephone Encounter (Signed)
Printed methadone script and placed in Kim's box.

## 2012-11-22 ENCOUNTER — Encounter: Payer: Self-pay | Admitting: Internal Medicine

## 2012-11-22 ENCOUNTER — Ambulatory Visit (INDEPENDENT_AMBULATORY_CARE_PROVIDER_SITE_OTHER): Payer: Medicare Other | Admitting: Internal Medicine

## 2012-11-22 VITALS — BP 112/68 | HR 71 | Temp 98.6°F | Resp 10 | Ht 63.0 in | Wt 133.0 lb

## 2012-11-22 DIAGNOSIS — D353 Benign neoplasm of craniopharyngeal duct: Secondary | ICD-10-CM

## 2012-11-22 DIAGNOSIS — E2740 Unspecified adrenocortical insufficiency: Secondary | ICD-10-CM

## 2012-11-22 DIAGNOSIS — D352 Benign neoplasm of pituitary gland: Secondary | ICD-10-CM | POA: Insufficient documentation

## 2012-11-22 DIAGNOSIS — E2749 Other adrenocortical insufficiency: Secondary | ICD-10-CM

## 2012-11-22 NOTE — Progress Notes (Signed)
Subjective:     Patient ID: [name] [name], female   DOB: 1946-01-01, 67 y.o.   MRN: [id number]  HPI [name] [name] is a 67 y/o unfortunate woman returning for f/u for ? adrenal insufficiency and pituitary microadenoma. As before, she is here with her husband, who offers part of the history.   She was found to have a lower-than-expected cortisol level (and therefore dx with adrenal insufficiency) during a hospitalization in 07/2012 - for seizures, unresponsiveness. 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 The adrenal insufficiency diagnosis is not very well established and she was referred to me for confirmation or R/o the dx. and also to f/u for her pituitary microadenoma.  When she left the hospital, she was started on hydrocortisone 10 mg in am and 5 mg in pm. She continued this, and we performed a cosyntropin stimulation test 2 weeks ago. 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 She was started on methadone 15 years ago. Even on methadone, she cannot function or walk well as she cannot feel bottom of her feet. She failed multiple meds and also ESI and sympathetic block. She also had multiple surgeries and fusion. She is unable to afford going to a pain clinic, to [name] has been managing her methadone. She did not tolerate the decreasing dose very well. She got to the point that she wanted to "be with her sister", who died recently. At that time, she stopped her hydrocortisone for most a week. [name] advised her to start immediately at  the previous dose, however she only restarted at 5 mg daily. She takes this whenever she remembers. She does not feel any different. Denies nausea/vomiting/abdominal pain, but she has severe back and leg pain as mentioned above. She also has increased inguinal pain from hydradenitis. She keeps falling. Her anxiety increased recently, and was not improved after decreasing the dose of hydrocortisone  I reviewed pt's medications, allergies, PMH, social hx, family hx and no changes required, except as mentioned above.  Review of Systems Constitutional: + weight loss, + fatigue, no subjective hyperthermia/hypothermia, + generalized pain, mainly in back, legs Eyes: no blurry vision, no xerophthalmia ENT: no sore throat, no nodules palpated in throat, no dysphagia/odynophagia, no hoarseness Cardiovascular: no CP/SOB/palpitations/leg swelling Respiratory: no cough/SOB Gastrointestinal: no N/V/D/C Musculoskeletal: no muscle/joint aches Skin: no rashes Neurological: no tremors/numbness/tingling/+ dizziness, dysequilibrium, frequent falls  Psychiatric: panic attacks  Objective:   Physical Exam BP 112/68  Pulse 71  Temp(Src) 98.6 F (37 C) (Oral)  Resp 10  Ht 5\' 3"  (1.6 m)  Wt 133 lb (60.328 kg)  BMI 23.57 kg/m2  SpO2 96% Wt Readings from Last 3 Encounters:  11/22/12 133 lb (60.328 kg)  11/13/12 135 lb 8 oz (61.462 kg)  11/07/12 130 lb (58.968 kg)  Constitutional: normal weight, in NAD, very unsure on her feet, walks with a cane Eyes: PERRLA, EOMI, no  exophthalmos ENT: moist mucous membranes, no thyromegaly, no cervical lymphadenopathy Cardiovascular: RRR, + SEM 2/6, no RG, mild periankle edema Respiratory: CTA B Gastrointestinal: abdomen soft, NT, ND, BS+ Musculoskeletal: no deformities, strength intact in all 4 Skin: moist, warm, no rashes Neurological: mild tremor with outstretched hands, DTR normal in all 4 Assessment:     1. Adrenal insufficiency - disproven by recent stim test   - on HC 5, was off med for ~1 week, w/o side effect  2. 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 Low cortisol level - during the admission 3 months ago - Stimulation test showed an adequate increase in cortisol, therefore unlikely secondary adrenal insufficiency. The robust aldosterone level point against primary adrenal insufficiency.  The point can be made that she had renal insufficiency at the time of the stim test, which can decrease cortisol clearance, however the best indication that she does not need hydrocortisone replacement is that she was doing well off it and she does not feel any differently in being only on 5 mg hydrocortisone daily. - I advised her to stop the hydrocortisone, but keep it at hand, and I advised her about possible adrenal insufficiency symptoms, in which case she might need to restart taking it. I advised her to call me if this happens.  2. Pituitary microadenoma  - thyroid labs, PRL, IGF1 normal. She does, however, have a degree of hypogonadism, reflected in the low LH level, however, I suspect that this is actually caused by her methadone treatment and not by her pituitary microadenoma. An undetectable estradiol level is common in postmenopausal women.  I will see her back in 6 months, and after this, I will follow her yearly for her pituitary microadenoma. She and her husband agree with the plan.

## 2012-11-22 NOTE — Patient Instructions (Addendum)
Please stop the hydrocortisone but keep it at hand. Please return in 6 months for a visit and repeat labs.

## 2012-11-28 ENCOUNTER — Encounter: Payer: Self-pay | Admitting: Family Medicine

## 2012-12-12 ENCOUNTER — Telehealth: Payer: Self-pay

## 2012-12-12 NOTE — Telephone Encounter (Signed)
No good options to treat acute pain flare. Please schedule appt to see me.

## 2012-12-12 NOTE — Telephone Encounter (Signed)
Pt said for 2 weeks pt has pain and burning in both legs; difficulty walking and sleeping.pt has increased her Methadone to 3-4 tabs q 8h which helps her to get small amt of sleep. Pt request something to help her pain. Sheliah Plane. Pt request cb.

## 2012-12-12 NOTE — Telephone Encounter (Signed)
Appt scheduled

## 2012-12-14 ENCOUNTER — Ambulatory Visit (INDEPENDENT_AMBULATORY_CARE_PROVIDER_SITE_OTHER): Payer: Medicare Other | Admitting: Family Medicine

## 2012-12-14 ENCOUNTER — Encounter: Payer: Self-pay | Admitting: Family Medicine

## 2012-12-14 VITALS — BP 134/90 | HR 80 | Temp 98.2°F | Wt 137.5 lb

## 2012-12-14 DIAGNOSIS — A879 Viral meningitis, unspecified: Secondary | ICD-10-CM

## 2012-12-14 DIAGNOSIS — G030 Nonpyogenic meningitis: Secondary | ICD-10-CM

## 2012-12-14 DIAGNOSIS — G8929 Other chronic pain: Secondary | ICD-10-CM

## 2012-12-14 DIAGNOSIS — M549 Dorsalgia, unspecified: Secondary | ICD-10-CM

## 2012-12-14 DIAGNOSIS — N179 Acute kidney failure, unspecified: Secondary | ICD-10-CM

## 2012-12-14 MED ORDER — METHADONE HCL 10 MG PO TABS: 30.0000 mg | ORAL_TABLET | Freq: Three times a day (TID) | ORAL | Status: DC | PRN

## 2012-12-14 NOTE — Assessment & Plan Note (Signed)
Again discussed I would not change pain regimen over phone and advised her not to self medicate and increase dose. Again reviewed no good options for acute flare of pain as want to avoid NSAIDs 2/2 renal insufficiency, tylenol ineffective, refuses other pain meds such as TCAs and other antidepressants. I will prescribe another #90 until end of month, then increase dose of methadone to #330/month, refill will be due 12/22/2012. recheck Cr - if stable, will recommend increase in gabapentin. Pt agrees with plan.

## 2012-12-14 NOTE — Patient Instructions (Signed)
I've refilled methadone - should last you until 12/22/2012 New prescription will be for 330# per month. Blood work to check kidneys today - if stable, we will increase gabapentin dose.

## 2012-12-14 NOTE — Progress Notes (Signed)
  Subjective:    Patient ID: [name] [name], female    DOB: 25-Dec-1945, 67 y.o.   MRN: [id number]  HPI CC: leg pain  Recently worsening leg pain due to arachnoiditis for which she's on methadone 30mg  Q8 hours #300 per month.  Last refilled 11/21/2012.    For last 2 weeks, pt tells me she has self increased methadone to 4-5 pills Q8 hours, due to a fall down stairs 2 weeks ago during rain. Increasing falls, increased dizziness. Continues gabapentin 300mg  at lunch and 1200mg  at bedtime.  Has appt with Dr. Nile Riggs next month to check vision.  Last home therapy was earlier this year, only worked with occupational therapy.  Refused physical therapy to work with legs 2/2 chronic pain.  Past Medical History  Diagnosis Date  . Arachnoiditis 1970    due to her Pantopaque myelograms in the 1970s, evidence of this on CT scan 11/2002  . Hypertension   . MVP (mitral valve prolapse)     h/o scarlet fever  . COPD (chronic obstructive pulmonary disease) 2003    by CXR, chronic bronchitis  . HLD (hyperlipidemia)   . Colon polyps   . H/O: CVA (cerebrovascular accident)     TIA  . Urine incontinence   . Anxiety     severe with panic attacks  . CAD (coronary artery disease) 2002    catheterization: 60% RCA stenosis, LAD 30%, L circ 25%  . Hydradenitis 2001    chronic, perineal s/p surgeries ([facility]) ?sweet's syndrome  . Psychogenic syncope 2003  . Orthostatic hypotension 2002  . DDD (degenerative disc disease), lumbar 2003    mult HNP with radiculopathy s/p mult surgeries and fusion  . Chronic pain syndrome     high dose methadone since hospitalization 05/16/2002, has not tolerated lyrica, cymbalta, zanaflex  . Chronic back pain     failed ESI and sympathetic blocks  . Osteoarthritis of both knees     severe  . Aspiration pneumonia 02/2012    after overdose  . Pituitary microadenoma 07/2012    possible by MRI most recent hospitalization, started on hydrocortisone for concern for adrenal  insufficiency  . Adenomatous polyp 04/2008    rec rpt colon 3 yrs ([facility])  . 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 Foot fracture, right 2012    ?nonunion  . 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 Constitutional: She appears well-developed and well-nourished. No distress.       Assessment & Plan:

## 2012-12-15 ENCOUNTER — Other Ambulatory Visit: Payer: Self-pay | Admitting: *Deleted

## 2012-12-15 ENCOUNTER — Encounter: Payer: Self-pay | Admitting: *Deleted

## 2012-12-15 LAB — RENAL FUNCTION PANEL
Albumin: 3.8 g/dL (ref 3.5–5.2)
BUN: 24 mg/dL — ABNORMAL HIGH (ref 6–23)
CO2: 28 mEq/L (ref 19–32)
Chloride: 103 mEq/L (ref 96–112)
Phosphorus: 4.3 mg/dL (ref 2.3–4.6)

## 2012-12-15 MED ORDER — ALPRAZOLAM 1 MG PO TABS: 1.0000 mg | ORAL_TABLET | Freq: Four times a day (QID) | ORAL | Status: DC | PRN

## 2012-12-15 NOTE — Telephone Encounter (Signed)
Ok to refill 

## 2012-12-15 NOTE — Telephone Encounter (Signed)
plz phone in. 

## 2012-12-15 NOTE — Telephone Encounter (Signed)
Medication phoned to pharmacy.  

## 2012-12-21 ENCOUNTER — Emergency Department (HOSPITAL_COMMUNITY)
Admission: EM | Admit: 2012-12-21 | Discharge: 2012-12-21 | Disposition: A | Discharge status: Home / Self Care | Payer: Medicare Other | Attending: Emergency Medicine | Admitting: Emergency Medicine

## 2012-12-21 ENCOUNTER — Encounter (HOSPITAL_COMMUNITY): Payer: Self-pay | Admitting: Emergency Medicine

## 2012-12-21 ENCOUNTER — Emergency Department (HOSPITAL_COMMUNITY): Payer: Medicare Other

## 2012-12-21 ENCOUNTER — Other Ambulatory Visit: Payer: Self-pay

## 2012-12-21 DIAGNOSIS — Z860100 Personal history of colon polyps, unspecified: Secondary | ICD-10-CM | POA: Insufficient documentation

## 2012-12-21 DIAGNOSIS — G030 Nonpyogenic meningitis: Secondary | ICD-10-CM

## 2012-12-21 DIAGNOSIS — Z79899 Other long term (current) drug therapy: Secondary | ICD-10-CM | POA: Insufficient documentation

## 2012-12-21 DIAGNOSIS — Y9389 Activity, other specified: Secondary | ICD-10-CM | POA: Insufficient documentation

## 2012-12-21 DIAGNOSIS — F17200 Nicotine dependence, unspecified, uncomplicated: Secondary | ICD-10-CM | POA: Insufficient documentation

## 2012-12-21 DIAGNOSIS — J449 Chronic obstructive pulmonary disease, unspecified: Secondary | ICD-10-CM | POA: Insufficient documentation

## 2012-12-21 DIAGNOSIS — Z8701 Personal history of pneumonia (recurrent): Secondary | ICD-10-CM | POA: Insufficient documentation

## 2012-12-21 DIAGNOSIS — I10 Essential (primary) hypertension: Secondary | ICD-10-CM | POA: Insufficient documentation

## 2012-12-21 DIAGNOSIS — R296 Repeated falls: Secondary | ICD-10-CM | POA: Insufficient documentation

## 2012-12-21 DIAGNOSIS — Z8679 Personal history of other diseases of the circulatory system: Secondary | ICD-10-CM | POA: Insufficient documentation

## 2012-12-21 DIAGNOSIS — F411 Generalized anxiety disorder: Secondary | ICD-10-CM | POA: Insufficient documentation

## 2012-12-21 DIAGNOSIS — Z8781 Personal history of (healed) traumatic fracture: Secondary | ICD-10-CM | POA: Insufficient documentation

## 2012-12-21 DIAGNOSIS — Z8669 Personal history of other diseases of the nervous system and sense organs: Secondary | ICD-10-CM | POA: Insufficient documentation

## 2012-12-21 DIAGNOSIS — Z880 Allergy status to penicillin: Secondary | ICD-10-CM | POA: Insufficient documentation

## 2012-12-21 DIAGNOSIS — Z872 Personal history of diseases of the skin and subcutaneous tissue: Secondary | ICD-10-CM | POA: Insufficient documentation

## 2012-12-21 DIAGNOSIS — Y92009 Unspecified place in unspecified non-institutional (private) residence as the place of occurrence of the external cause: Secondary | ICD-10-CM | POA: Insufficient documentation

## 2012-12-21 DIAGNOSIS — M1710 Unilateral primary osteoarthritis, unspecified knee: Secondary | ICD-10-CM | POA: Insufficient documentation

## 2012-12-21 DIAGNOSIS — Z862 Personal history of diseases of the blood and blood-forming organs and certain disorders involving the immune mechanism: Secondary | ICD-10-CM | POA: Insufficient documentation

## 2012-12-21 DIAGNOSIS — Z7982 Long term (current) use of aspirin: Secondary | ICD-10-CM | POA: Insufficient documentation

## 2012-12-21 DIAGNOSIS — Z87448 Personal history of other diseases of urinary system: Secondary | ICD-10-CM | POA: Insufficient documentation

## 2012-12-21 DIAGNOSIS — M549 Dorsalgia, unspecified: Secondary | ICD-10-CM

## 2012-12-21 DIAGNOSIS — Z8673 Personal history of transient ischemic attack (TIA), and cerebral infarction without residual deficits: Secondary | ICD-10-CM | POA: Insufficient documentation

## 2012-12-21 DIAGNOSIS — IMO0002 Reserved for concepts with insufficient information to code with codable children: Secondary | ICD-10-CM | POA: Insufficient documentation

## 2012-12-21 DIAGNOSIS — J4489 Other specified chronic obstructive pulmonary disease: Secondary | ICD-10-CM | POA: Insufficient documentation

## 2012-12-21 DIAGNOSIS — Z8639 Personal history of other endocrine, nutritional and metabolic disease: Secondary | ICD-10-CM | POA: Insufficient documentation

## 2012-12-21 DIAGNOSIS — I2510 Atherosclerotic heart disease of native coronary artery without angina pectoris: Secondary | ICD-10-CM | POA: Insufficient documentation

## 2012-12-21 DIAGNOSIS — Z8739 Personal history of other diseases of the musculoskeletal system and connective tissue: Secondary | ICD-10-CM | POA: Insufficient documentation

## 2012-12-21 NOTE — Telephone Encounter (Signed)
Pt left v/m that her methadone is due on 12/24/12 and pts pharmacy is closed on Sat night and Sunday. Pt request to pick up rx on 12/22/12. Call when ready for pick up. (Quantity was changed to #330 as noted in AVS 12/14/12.)

## 2012-12-21 NOTE — ED Notes (Signed)
Pt stated that she fell around 1730 tonight, attempting to catch her cat.  Pt stated she fell on her back, and experienced ShOB.  Pt stated pain to back, denies head pain at this time.  Pt denies taking blood thinners.  Pt ambulatory to triage.

## 2012-12-21 NOTE — ED Provider Notes (Signed)
CSN: [id number]     Arrival date & time 12/21/12  1908 History     First MD Initiated Contact with Patient 12/21/12 1927     Chief Complaint  Patient presents with  . Fall   (Consider location/radiation/quality/duration/timing/severity/associated sxs/prior Treatment) HPI Comments: 67 yo female with upper back pain after falling at home onto the grass.  She landed on upper back, brief wind knocked out of her, no loc or significant head injury. No blood thinners. Pain with movement.  Pt on pain meds at home.   No cp or sob.  No sxs prior to fall, uses walker and tripped.   Patient is a 67 y.o. female presenting with fall. The history is provided by the patient.  Fall This is a new problem. Pertinent negatives include no chest pain, no headaches and no shortness of breath.    Past Medical History  Diagnosis Date  . Arachnoiditis 1970    due to her Pantopaque myelograms in the 1970s, evidence of this on CT scan 11/2002  . Hypertension   . MVP (mitral valve prolapse)     h/o scarlet fever  . COPD (chronic obstructive pulmonary disease) 2003    by CXR, chronic bronchitis  . HLD (hyperlipidemia)   . Colon polyps   . H/O: CVA (cerebrovascular accident)     TIA  . Urine incontinence   . Anxiety     severe with panic attacks  . CAD (coronary artery disease) 2002    catheterization: 60% RCA stenosis, LAD 30%, L circ 25%  . Hydradenitis 2001    chronic, perineal s/p surgeries ([facility]) ?sweet's syndrome  . Psychogenic syncope 2003  . Orthostatic hypotension 2002  . DDD (degenerative disc disease), lumbar 2003    mult HNP with radiculopathy s/p mult surgeries and fusion  . Chronic pain syndrome     high dose methadone since hospitalization 05/16/2002, has not tolerated lyrica, cymbalta, zanaflex  . Chronic back pain     failed ESI and sympathetic blocks  . Osteoarthritis of both knees     severe  . Aspiration pneumonia 02/2012    after overdose  . Pituitary microadenoma 07/2012     possible by MRI most recent hospitalization, started on hydrocortisone for concern for adrenal insufficiency  . Adenomatous polyp 04/2008    rec rpt colon 3 yrs ([facility])  . 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 Foot fracture, right 2012    ?nonunion  . 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 Laminectomy  1976,1977,1999,2003    x 4  . Abdominal hysterectomy  1985  . Appendectomy  1963  . Breast biopsy  1966  . Cardiac catheterization  2004  . Lumbar fusion  2003    L4/5 laminotomy with posterior lumbar interbody fusion ([facility])  . Knee arthroscopy Left     [facility] O[facility]  . Colonoscopy  04/2008    tubular adenoma, int hem, rec rpt 3 yrs - Dr. Bosie Clos with [facility]   Family History  Problem Relation Age of Onset  . Arthritis    . CAD Mother     MI  . CAD Father     MI s/p 3v CABG  . Kidney failure Father     ESRD on HD  . Stroke Mother   . Cancer Neg Hx   . Diabetes Neg Hx   . Deep vein thrombosis  Mother   . Deep vein thrombosis Father   . Deep vein thrombosis Paternal Grandfather    History  Substance Use Topics  . Smoking status: Current Every Day Smoker -- 0.25 packs/day    Types: Cigarettes  . Smokeless tobacco: Never Used  . Alcohol Use: No   OB History   Grav Para Term Preterm Abortions TAB SAB Ect Mult Living                 Review of Systems  Respiratory: Negative for shortness of breath.   Cardiovascular: Negative for chest pain.  Musculoskeletal: Positive for back pain and gait problem (chronic).  Skin: Negative for rash.  Neurological: Negative for syncope, weakness, numbness and headaches.    Allergies  Penicillins; Sulfa antibiotics; Sertraline hcl; Beta adrenergic blockers; Other; Toradol; Tylenol; Versed; and Codeine  Home Medications   Current Outpatient Rx  Name  Route  Sig  Dispense  Refill  . albuterol (PROVENTIL  HFA;VENTOLIN HFA) 108 (90 BASE) MCG/ACT inhaler   Inhalation   Inhale 2 puffs into the lungs every 6 (six) hours as needed for wheezing.   1 Inhaler   2   . ALPRAZolam (XANAX) 1 MG tablet   Oral   Take 1 tablet (1 mg total) by mouth 4 (four) times daily as needed for anxiety.   120 tablet   0   . aspirin 81 MG chewable tablet   Oral   Chew 1 tablet (81 mg total) by mouth daily.         . furosemide (LASIX) 20 MG tablet   Oral   Take 20 mg by mouth daily as needed (swelling).          . gabapentin (NEURONTIN) 300 MG capsule   Oral   Take 300-1,200 mg by mouth See admin instructions. 300 mg at lunch and 1200 mg at bedtime         . methadone (DOLOPHINE) 10 MG tablet   Oral   Take 3-4 tablets (30-40 mg total) by mouth every 8 (eight) hours as needed for pain.   90 tablet   0   . Multiple Vitamin (MULTIVITAMIN WITH MINERALS) TABS   Oral   Take 1 tablet by mouth daily.          BP 158/75  Pulse 71  Temp(Src) 97.9 F (36.6 C) (Oral)  Resp 18  Ht 5\' 3"  (1.6 m)  Wt 130 lb (58.968 kg)  BMI 23.03 kg/m2  SpO2 98% Physical Exam  Nursing note and vitals reviewed. Constitutional: She is oriented to person, place, and time. She appears well-developed and well-nourished.  HENT:  Head: Normocephalic and atraumatic.  Eyes: Pupils are equal, round, and reactive to light.  Neck: Normal range of motion. Neck supple.  Cardiovascular: Normal rate.   Pulmonary/Chest: Effort normal. No respiratory distress.  Abdominal: Soft.  Musculoskeletal: She exhibits tenderness. She exhibits no edema.  Mild mid thoracic midline tender, no step off No cervical pain, full rom  Neurological: She is alert and oriented to person, place, and time.    ED Course   Procedures (including critical care time)  Labs Reviewed - No data to display Dg Thoracic Spine 2 View  12/21/2012   *RADIOLOGY REPORT*  Clinical Data: Fall with upper back pain.  THORACIC SPINE - 2 VIEW  Comparison: None.   Findings: No evidence of thoracic fracture or subluxation.  Mild osteophyte formation is seen at multiple levels.  No bony lesions or destruction identified.  No  surrounding soft tissue abnormalities are seen.  IMPRESSION: Mild degenerative changes of the thoracic spine.  No acute fracture is identified.   Original Report Authenticated By: Irish Lack, M.D.   1. Back pain     MDM  Low risk fall.  Pt walking around the ER, no signs of pain. Xray no fx.   No sxs prior.  Vitals unremarkable.  Filed Vitals:   12/21/12 1923  BP: 158/75  Pulse: 71  Temp: 97.9 F (36.6 C)  Resp: 18   DC  Enid Skeens, MD 12/21/12 2056

## 2012-12-22 MED ORDER — METHADONE HCL 10 MG PO TABS: 30.0000 mg | ORAL_TABLET | Freq: Three times a day (TID) | ORAL | Status: DC | PRN

## 2012-12-22 NOTE — Telephone Encounter (Signed)
Attempted to call patient x3. Someone answered the phone the first time, told me to hold on when I asked to speak with patient and then hung up on me. I called back twice and they picked up the phone and hung it up without answering. Rx placed up front for pick up.

## 2012-12-22 NOTE — Telephone Encounter (Signed)
plz notify ready to pick up. 

## 2012-12-27 ENCOUNTER — Telehealth: Payer: Self-pay

## 2012-12-27 NOTE — Telephone Encounter (Signed)
will see then. 

## 2012-12-27 NOTE — Telephone Encounter (Signed)
Pt said on 12/26/12 pt noticed swelling in ankles; pt is hoarse but no sorethroat; dizzy and hard to breathe on and off but inhaler helps. Pt wanted to know if adrenal gland could cause problems like this. Pt does not have CP and request appt. Pt scheduled appt 12/28/12 at 2 pm(1st appt pt could accept). Advised pt to keep feet elevated and if condition changed or worsened to go to UC> pt said she thought tomorrow's appt was OK.

## 2012-12-28 ENCOUNTER — Ambulatory Visit (INDEPENDENT_AMBULATORY_CARE_PROVIDER_SITE_OTHER): Payer: Medicare Other | Admitting: Family Medicine

## 2012-12-28 ENCOUNTER — Encounter: Payer: Self-pay | Admitting: Family Medicine

## 2012-12-28 VITALS — BP 148/90 | HR 80 | Temp 98.6°F | Wt 136.5 lb

## 2012-12-28 DIAGNOSIS — R490 Dysphonia: Secondary | ICD-10-CM

## 2012-12-28 DIAGNOSIS — N179 Acute kidney failure, unspecified: Secondary | ICD-10-CM

## 2012-12-28 DIAGNOSIS — R0600 Dyspnea, unspecified: Secondary | ICD-10-CM | POA: Insufficient documentation

## 2012-12-28 DIAGNOSIS — E785 Hyperlipidemia, unspecified: Secondary | ICD-10-CM

## 2012-12-28 DIAGNOSIS — R0609 Other forms of dyspnea: Secondary | ICD-10-CM

## 2012-12-28 LAB — BASIC METABOLIC PANEL
BUN: 23 mg/dL (ref 6–23)
CO2: 33 mEq/L — ABNORMAL HIGH (ref 19–32)
Calcium: 10.4 mg/dL (ref 8.4–10.5)
Creatinine, Ser: 1.5 mg/dL — ABNORMAL HIGH (ref 0.4–1.2)
Glucose, Bld: 79 mg/dL (ref 70–99)
Sodium: 143 mEq/L (ref 135–145)

## 2012-12-28 NOTE — Progress Notes (Signed)
Subjective:    Patient ID: [name] [name], female    DOB: Feb 23, 1946, 67 y.o.   MRN: [id number]  HPI CC: multiple issues  Over last few weeks noticing increasing hoarseness without associated sore throat or dysphagia.  Denies GERD sxs.  Denies PDrainage or allergy sxs.   Over last week noticing increased trouble laying flat.  Easily gets short winded with lifting cat.  Denies cough, chest pain. has noticed increased leg swelling for the last week. Current smoker 1/4 ppd, cutting back.  Prior on lasix prn swelling but has run out. On albuterol inhaler - takes twice daily.  Drinks caffeinated tea throughout the day, does not like water.  Worsening pain in legs. Wt Readings from Last 3 Encounters:  12/28/12 136 lb 8 oz (61.916 kg)  12/21/12 130 lb (58.968 kg)  12/14/12 137 lb 8 oz (62.37 kg)   Lab Results  Component Value Date   CREATININE 1.4* 12/14/2012   Past Medical History  Diagnosis Date  . Arachnoiditis 1970    due to her Pantopaque myelograms in the 1970s, evidence of this on CT scan 11/2002  . Hypertension   . MVP (mitral valve prolapse)     h/o scarlet fever  . COPD (chronic obstructive pulmonary disease) 2003    by CXR, chronic bronchitis  . HLD (hyperlipidemia)   . Colon polyps   . H/O: CVA (cerebrovascular accident)     TIA  . Urine incontinence   . Anxiety     severe with panic attacks  . CAD (coronary artery disease) 2002    catheterization: 60% RCA stenosis, LAD 30%, L circ 25%  . Hydradenitis 2001    chronic, perineal s/p surgeries ([facility]) ?sweet's syndrome  . Psychogenic syncope 2003  . Orthostatic hypotension 2002  . DDD (degenerative disc disease), lumbar 2003    mult HNP with radiculopathy s/p mult surgeries and fusion  . Chronic pain syndrome     high dose methadone since hospitalization 05/16/2002, has not tolerated lyrica, cymbalta, zanaflex  . Chronic back pain     failed ESI and sympathetic blocks  . Osteoarthritis of both knees     severe   . Aspiration pneumonia 02/2012    after overdose  . 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 Adenomatous polyp 04/2008    rec rpt colon 3 yrs ([facility])  . 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 Foot fracture, right 2012    ?nonunion  . 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 Laminectomy  1976,1977,1999,2003    x 4  . Abdominal hysterectomy  1985  . Appendectomy  1963  . Breast biopsy  1966  . Cardiac catheterization  2004  . Lumbar fusion  2003    L4/5 laminotomy with posterior lumbar interbody fusion ([facility])  . Knee arthroscopy Left     [facility]  . Colonoscopy  04/2008    tubular adenoma, int hem, rec rpt 3 yrs - Dr. Bosie Clos with [facility]   Review of Systems Per HPI    Objective:   Physical Exam  Nursing note and vitals reviewed. Constitutional: She appears well-developed. No distress.  HENT:  Mouth/Throat: Oropharynx is clear and moist. No oropharyngeal exudate.  Eyes: Conjunctivae and EOM are normal. Pupils are equal, round, and reactive to light.  Neck: Normal range of motion. Neck supple. No thyromegaly present.  Cardiovascular: Normal rate, regular rhythm, normal heart sounds and intact distal pulses.   No murmur heard. Pulmonary/Chest: Effort normal and breath sounds normal. No respiratory distress. She has no wheezes. She has no rales.  Musculoskeletal: She exhibits edema (mild nonpitting bilaterally).  Lymphadenopathy:    She has no cervical adenopathy.  Skin: Skin is warm and dry.  Psychiatric: She has a normal mood and affect.       Assessment & Plan:

## 2012-12-28 NOTE — Assessment & Plan Note (Signed)
Mild, of 1 mo duration.   rec treat with throat lozenges or hard candy, if not improved after 1-2 weeks, will refer to ENT for further eval (given h/o smoking).

## 2012-12-28 NOTE — Patient Instructions (Signed)
Try to back off tea and increase water (may add crystal light to water). Blood work today.  If stable kidneys, we may try short course of lasix. For hoarseness - try sucking on sugarfree hard candy.  If not better after 1-2 weeks, let me know for referral to ENT. For shortness of breath - I'm worried it may be from lungs and smoking.  Return to see me in 1-2 weeks for office spirometry

## 2012-12-28 NOTE — Assessment & Plan Note (Signed)
Exam WNL today. No impressive pedal edema today. In h/o nonobstructive CAD and HTN, will check BNP to eval for CHF as cause. In longtime smoker, I have asked her to return for spirometry to eval for COPD. Discussed limiting albuterol use.

## 2013-01-04 ENCOUNTER — Ambulatory Visit (INDEPENDENT_AMBULATORY_CARE_PROVIDER_SITE_OTHER): Payer: Medicare Other | Admitting: Family Medicine

## 2013-01-04 ENCOUNTER — Telehealth: Payer: Self-pay | Admitting: Family Medicine

## 2013-01-04 ENCOUNTER — Encounter: Payer: Self-pay | Admitting: Family Medicine

## 2013-01-04 VITALS — BP 112/64 | HR 64 | Temp 97.9°F | Wt 135.5 lb

## 2013-01-04 DIAGNOSIS — R0609 Other forms of dyspnea: Secondary | ICD-10-CM

## 2013-01-04 DIAGNOSIS — F17200 Nicotine dependence, unspecified, uncomplicated: Secondary | ICD-10-CM | POA: Insufficient documentation

## 2013-01-04 DIAGNOSIS — R0600 Dyspnea, unspecified: Secondary | ICD-10-CM

## 2013-01-04 MED ORDER — ALBUTEROL SULFATE (2.5 MG/3ML) 0.083% IN NEBU
2.5000 mg | INHALATION_SOLUTION | Freq: Once | RESPIRATORY_TRACT | Status: AC
  Administered 2013-01-04: 2.5 mg via RESPIRATORY_TRACT

## 2013-01-04 MED ORDER — FUROSEMIDE 20 MG PO TABS: 20.0000 mg | ORAL_TABLET | Freq: Every day | ORAL | Status: AC | PRN

## 2013-01-04 NOTE — Progress Notes (Signed)
Subjective:    Patient ID: [name] [name], female    DOB: [birth year], 67 y.o.   MRN: [id number]  HPI CC: spirometry  [name] [name] is a 67 yo patient of mine with h/o chronic back pain and arachnoiditis on long term methadone. Multiple spine surgeries in past. On disability for chronic debilitating spine and leg pain.  Seen here last week with concern for increasing dyspnea on exertion.  BNP 148.  In h/o smoker, I asked her to return today for pre/post spirometry. Endorses orthopnea, PNDyspnea, pedal edema (actually improved), easily getting short winded with lifting cat.  Denies cough, chest pain.   Current smoker 1/4 ppd, cutting back.  Started smoking 45 yrs ago, about 1 ppd on average. On albuterol inhaler - takes twice daily.  Doesn't think this helps. Prior on lasix prn swelling but had run out.  H/o COPD by CXR (2003).  H/o chronic bronchitis. H/o remote pneumonia (~2004). Denies h/o childhood lung infections.  (07/2012): Clinical Data: Rule out basilar infiltrate.  PORTABLE CHEST - 1 VIEW  Comparison: Chest x-ray 07/27/2012.  Findings: Lungs are well expanded, without consolidative airspace disease or definite pleural effusions. There are some patchy chronically increased interstitial opacities in the lung apices and the medial aspect of the lung bases which is similar to prior examinations. No evidence of pulmonary edema. Mild cardiomegaly is unchanged. Upper mediastinal contours are within normal limits. Atherosclerosis of the thoracic aorta.  IMPRESSION:  1. No definite radiographic evidence of acute cardiopulmonary disease.  2. Chronically increased interstitial markings, as above, of uncertain etiology and significance.  3. Mild cardiomegaly.  4. Atherosclerosis.   (07/2012): 2D ECHOCARDIOGRAM Study Conclusions - Left ventricle: The cavity size was normal. Systolicfunction was normal. The estimated ejection fraction wasin the range of 55% to 60%. Wall motion was normal; there  were no regional wall motion abnormalities. - Mitral valve: Mild regurgitation. - Left atrium: The atrium was mildly dilated. - Right atrium: The atrium was mildly dilated. - Atrial septum: No defect or patent foramen ovale wasidentified.   Past Medical History  Diagnosis Date  . Arachnoiditis 1970    due to her Pantopaque myelograms in the 1970s, evidence of this on CT scan 11/2002  . Hypertension   . MVP (mitral valve prolapse)     h/o scarlet fever  . COPD (chronic obstructive pulmonary disease) 2003    by CXR, chronic bronchitis  . HLD (hyperlipidemia)   . Colon polyps   . H/O: CVA (cerebrovascular accident)     TIA  . Urine incontinence   . Anxiety     severe with panic attacks  . CAD (coronary artery disease) 2002    catheterization: 60% RCA stenosis, LAD 30%, L circ 25%  . Hydradenitis 2001    chronic, perineal s/p surgeries ([facility]) ?sweet's syndrome  . Psychogenic syncope 2003  . Orthostatic hypotension 2002  . DDD (degenerative disc disease), lumbar 2003    mult HNP with radiculopathy s/p mult surgeries and fusion  . Chronic pain syndrome     high dose methadone since hospitalization 05/16/2002, has not tolerated lyrica, cymbalta, zanaflex  . Chronic back pain     failed ESI and sympathetic blocks  . Osteoarthritis of both knees     severe  . Aspiration pneumonia 02/2012    after overdose  . 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 Adenomatous polyp 04/2008    rec rpt  colon 3 yrs [employer])  . 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 Foot fracture, right 2012    ?nonunion  . 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 Constitutional: She appears well-developed and well-nourished. No distress.  Thin and  tired, nontoxic  HENT:  Mouth/Throat: Oropharynx is clear and moist. No oropharyngeal exudate.  Cardiovascular: Normal rate, regular rhythm, normal heart sounds and intact distal pulses.   No murmur heard. Pulmonary/Chest: Effort normal and breath sounds normal. No respiratory distress. She has no wheezes. She has no rales.  Some bibasilar crackles  Musculoskeletal: She exhibits no edema (no pitting edema today).       Assessment & Plan:

## 2013-01-04 NOTE — Assessment & Plan Note (Signed)
I continue to encourage smoking cessation. ~45 PY hx.

## 2013-01-04 NOTE — Assessment & Plan Note (Addendum)
BNP 148 last month.  2Decho stable 07/2012. In office spirometry today showing severe restriction.  Unsure how good of an effort she was able to provide today for spirometry 2/2 chronic shoulder and back pain. I will refer to pulm for formal PFTs, would like their input on her persistent dyspnea. Pt agrees with plan.

## 2013-01-04 NOTE — Telephone Encounter (Signed)
The patient is hoping to get her referral to her pulmonary dr as soon as possible. Thanks!

## 2013-01-04 NOTE — Patient Instructions (Signed)
May use lasix as needed for leg swelling - but I don't want you using more than 3 days in a row.  And make sure you drink plenty of water.  Keep elevating legs when at home. Pass by Marion's office for referral to lung doctor.

## 2013-01-11 ENCOUNTER — Other Ambulatory Visit: Payer: Self-pay | Admitting: Family Medicine

## 2013-01-11 NOTE — Telephone Encounter (Signed)
Electronic refill request, please advise  

## 2013-01-11 NOTE — Telephone Encounter (Signed)
plz phone in. 

## 2013-01-11 NOTE — Telephone Encounter (Signed)
Rx called in as prescribed 

## 2013-01-12 ENCOUNTER — Telehealth: Payer: Self-pay | Admitting: Family Medicine

## 2013-01-12 NOTE — Telephone Encounter (Signed)
The patient called inquiring about her pulmonary referral.  She stated this was discussed in her last office visit, but no appointments have been made yet.  She is hoping someone will call her soon about to make this appointment.   Thanks!

## 2013-01-17 ENCOUNTER — Encounter: Payer: Self-pay | Admitting: Family Medicine

## 2013-01-17 ENCOUNTER — Ambulatory Visit (INDEPENDENT_AMBULATORY_CARE_PROVIDER_SITE_OTHER): Payer: Medicare Other | Admitting: Family Medicine

## 2013-01-17 VITALS — BP 128/80 | HR 84 | Temp 97.8°F | Wt 135.0 lb

## 2013-01-17 DIAGNOSIS — R197 Diarrhea, unspecified: Secondary | ICD-10-CM | POA: Insufficient documentation

## 2013-01-17 DIAGNOSIS — G030 Nonpyogenic meningitis: Secondary | ICD-10-CM

## 2013-01-17 DIAGNOSIS — R0609 Other forms of dyspnea: Secondary | ICD-10-CM

## 2013-01-17 DIAGNOSIS — M549 Dorsalgia, unspecified: Secondary | ICD-10-CM

## 2013-01-17 DIAGNOSIS — F419 Anxiety disorder, unspecified: Secondary | ICD-10-CM

## 2013-01-17 DIAGNOSIS — R0600 Dyspnea, unspecified: Secondary | ICD-10-CM

## 2013-01-17 DIAGNOSIS — G8929 Other chronic pain: Secondary | ICD-10-CM

## 2013-01-17 DIAGNOSIS — W19XXXA Unspecified fall, initial encounter: Secondary | ICD-10-CM | POA: Insufficient documentation

## 2013-01-17 DIAGNOSIS — A879 Viral meningitis, unspecified: Secondary | ICD-10-CM

## 2013-01-17 DIAGNOSIS — F411 Generalized anxiety disorder: Secondary | ICD-10-CM

## 2013-01-17 DIAGNOSIS — W19XXXS Unspecified fall, sequela: Secondary | ICD-10-CM

## 2013-01-17 NOTE — Patient Instructions (Addendum)
Go to the lab on the way out.  We'll contact you with your lab report. I'll talk to Dr. Reece Agar in the meantime.  Take care.

## 2013-01-17 NOTE — Assessment & Plan Note (Signed)
Will defer to PCP

## 2013-01-17 NOTE — Assessment & Plan Note (Signed)
With pulm f/u pending per patient report.  ctab today

## 2013-01-17 NOTE — Assessment & Plan Note (Signed)
fx unlikely, will defer to PCP about consideration of balance training referral.

## 2013-01-17 NOTE — Progress Notes (Signed)
She has fallen down a staircase twice in the last few days.  She landed on her back both times w/o LOC.  He upper back is sore.   She has a f/u with pulmonary pending.  She is still SOB episodically.    Diarrhea. Going on for 3-4 months.  Having to wear depends in meantime. Diarrhea is happening 5-6 times per 24 hours.  No pain with BMs. No blood but mucous noted in stools.  No vomiting but some nausea.  No similar sx like this prev.  She was having to take imodium frequently before the diarrhea started.  No exotic food or travel.  Wasn't on abx before the sx started.    Colonoscopy done in 2009.  H/o polyps noted.  "They wanted me to come back but they couldn't get me to sleep for the procedure and it was painful."    She is still upset about the death of her sister. "I'm living in a run down trailer.  My husband has hepatitis C."  She is still anxious and had talked with PCP about her xanax rx.   OBTW- "I need my methadone filled."  I told her I would talk with her PCP.   Meds, vitals, and allergies reviewed.   ROS: See HPI.  Otherwise, noncontributory.  Chronically ill appearing woman in NAD Mmm rrr ctab Upper back ttp but not focally and w/o bruising noted abd soft RLQ mildly ttp (chronic per patient), normal BS, no rebound Ext w/o edema Walking with cane

## 2013-01-17 NOTE — Assessment & Plan Note (Addendum)
Benign exam with mild tenderness.  Would check LFTs and stool studies.  We can go from there.  Will d/w PCP.  I held off on GI referral for now.

## 2013-01-17 NOTE — Assessment & Plan Note (Deleted)
Will defer to PCP

## 2013-01-18 ENCOUNTER — Telehealth: Payer: Self-pay | Admitting: Family Medicine

## 2013-01-18 ENCOUNTER — Ambulatory Visit: Payer: Medicare Other | Admitting: Family Medicine

## 2013-01-18 ENCOUNTER — Telehealth: Payer: Self-pay | Admitting: *Deleted

## 2013-01-18 NOTE — Telephone Encounter (Signed)
Message left regarding diarrhea that patient was seen for. It is no better and she needs something to help it. Please advise.

## 2013-01-18 NOTE — Telephone Encounter (Signed)
Patient called to find out if anything would be called in for the diarrhea.  Patient said the diarrhea is constant.  Patient uses [facility] on [address].

## 2013-01-18 NOTE — Telephone Encounter (Signed)
I would use otc imodium, up to 3 tabs a day.  That may help in the meantime.

## 2013-01-19 ENCOUNTER — Other Ambulatory Visit: Payer: Self-pay

## 2013-01-19 DIAGNOSIS — G030 Nonpyogenic meningitis: Secondary | ICD-10-CM

## 2013-01-19 DIAGNOSIS — G8929 Other chronic pain: Secondary | ICD-10-CM

## 2013-01-19 LAB — HEPATIC FUNCTION PANEL
ALT: 17 U/L (ref 0–35)
AST: 23 U/L (ref 0–37)
Bilirubin, Direct: 0 mg/dL (ref 0.0–0.3)
Total Bilirubin: 0.3 mg/dL (ref 0.3–1.2)

## 2013-01-19 MED ORDER — METHADONE HCL 10 MG PO TABS: 30.0000 mg | ORAL_TABLET | Freq: Three times a day (TID) | ORAL | Status: DC | PRN

## 2013-01-19 NOTE — Telephone Encounter (Signed)
May pick up - printed out - but make pt aware will regardless need to wait until 9/1 to fill.  Printed and placed in Kim's box

## 2013-01-19 NOTE — Telephone Encounter (Signed)
Spoke with patient. She said she wasn't given containers for any stool studies at her visit. Advised to come pick them up now so they can be returned today. She said she has taken "4 bottles of imodium over them last month and they haven't helped". Her husband is on the way now to pick up stool containers and they will return them today. Advised to stay hydrated in the meantime.

## 2013-01-19 NOTE — Telephone Encounter (Signed)
Patient notified as instructed by telephone. pts husband coming now to pick up rx. Be here within 10 mins.

## 2013-01-19 NOTE — Telephone Encounter (Signed)
Stool studies aren't back yet. Agree with below - has she tried immodium?

## 2013-01-19 NOTE — Telephone Encounter (Signed)
Pt request methadone rx to be picked up today; pt will be out of med on 01/22/13. Reviewed with pt refill request and allowing 24-48 hrs prior to needing med. Pt request cb today so pt's husband can pick up today before 5 pm.

## 2013-01-19 NOTE — Telephone Encounter (Signed)
Pt called wanting instructions on how to do both stool tests. She said she had two containers one with a white lid, and another container that came with white paper and instructions on it. These are the two containers needed to do both the C diff and iFob tests.

## 2013-01-19 NOTE — Telephone Encounter (Signed)
Pt's husband just brought in specimen for C diff, did not return ifob kit.

## 2013-01-19 NOTE — Telephone Encounter (Signed)
Advised patient.  She states she has been taking a couple of extra pills a day, so she will run out of the medicine this week end, thinks she has enough to get her through Saturday.  She says she's doing the best that she can.

## 2013-01-19 NOTE — Addendum Note (Signed)
Addended by: Baldomero Lamy on: 01/19/2013 01:19 PM   Modules accepted: Orders

## 2013-01-20 NOTE — Telephone Encounter (Signed)
Looks like iFOB and C diff was ordered, not stool cultures.

## 2013-01-22 NOTE — Telephone Encounter (Signed)
I didn't order stool culture, just the IFOB and the Cdiff given the duration and lack of travel/exotic foods.   All was discussed with pt at the OV.  Please let me know how I can be of service.

## 2013-01-23 ENCOUNTER — Other Ambulatory Visit (INDEPENDENT_AMBULATORY_CARE_PROVIDER_SITE_OTHER): Payer: Medicare Other

## 2013-01-23 DIAGNOSIS — R197 Diarrhea, unspecified: Secondary | ICD-10-CM

## 2013-01-24 ENCOUNTER — Encounter: Payer: Self-pay | Admitting: *Deleted

## 2013-02-08 ENCOUNTER — Other Ambulatory Visit: Payer: Self-pay | Admitting: Family Medicine

## 2013-02-08 ENCOUNTER — Encounter: Payer: Self-pay | Admitting: Family Medicine

## 2013-02-08 ENCOUNTER — Telehealth: Payer: Self-pay | Admitting: Family Medicine

## 2013-02-08 ENCOUNTER — Ambulatory Visit: Payer: Medicare Other | Admitting: Family Medicine

## 2013-02-08 ENCOUNTER — Ambulatory Visit (INDEPENDENT_AMBULATORY_CARE_PROVIDER_SITE_OTHER): Payer: Medicare Other | Admitting: Family Medicine

## 2013-02-08 VITALS — BP 130/82 | HR 71 | Temp 97.9°F | Wt 137.5 lb

## 2013-02-08 DIAGNOSIS — A879 Viral meningitis, unspecified: Secondary | ICD-10-CM

## 2013-02-08 DIAGNOSIS — G8929 Other chronic pain: Secondary | ICD-10-CM

## 2013-02-08 DIAGNOSIS — Z23 Encounter for immunization: Secondary | ICD-10-CM

## 2013-02-08 DIAGNOSIS — G030 Nonpyogenic meningitis: Secondary | ICD-10-CM

## 2013-02-08 DIAGNOSIS — R748 Abnormal levels of other serum enzymes: Secondary | ICD-10-CM

## 2013-02-08 MED ORDER — TRAMADOL HCL 50 MG PO TABS: 50.0000 mg | ORAL_TABLET | Freq: Three times a day (TID) | ORAL | Status: DC | PRN

## 2013-02-08 NOTE — Progress Notes (Signed)
  Subjective:    Patient ID: [name] [name], female    DOB: Aug 09, 1945, 67 y.o.   MRN: [id number]  HPI CC: discuss leg pains  67 yo with chronic leg pain from chronic arachnoiditis since 1970s presents to discuss breakthrough pain for chronic arachnoiditis.  She is on methadone for this, 30-40mg  Q8 hours #330 per month.  Last filled 01/22/2013.  For the last week (since Thursday) has been having breakthrough pain in her legs described as severe burning pain - trouble sleeping 2/2 pain.  Despite methadone 4 pills QID.  Taking extra gabapentin at night time - doesn't help. TENS unit hasn't helped in the past. Several other pain regimens haven't helped in the past.   Lab Results  Component Value Date   CREATININE 1.5* 12/28/2012    Past Medical History  Diagnosis Date  . Arachnoiditis 1970    due to her Pantopaque myelograms in the 1970s, evidence of this on CT scan 11/2002  . Hypertension   . MVP (mitral valve prolapse)     h/o scarlet fever  . COPD (chronic obstructive pulmonary disease) 2003    by CXR, chronic bronchitis  . HLD (hyperlipidemia)   . Colon polyps   . H/O: CVA (cerebrovascular accident)     TIA  . Urine incontinence   . Anxiety     severe with panic attacks  . CAD (coronary artery disease) 2002    catheterization: 60% RCA stenosis, LAD 30%, L circ 25%  . Hydradenitis 2001    chronic, perineal s/p surgeries ([facility]) ?sweet's syndrome  . Psychogenic syncope 2003  . Orthostatic hypotension 2002  . DDD (degenerative disc disease), lumbar 2003    mult HNP with radiculopathy s/p mult surgeries and fusion  . Chronic pain syndrome     high dose methadone since hospitalization 05/16/2002, has not tolerated lyrica, cymbalta, zanaflex  . Chronic back pain     failed ESI and sympathetic blocks  . Osteoarthritis of both knees     severe  . Aspiration pneumonia 02/2012    after overdose  . 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 Adenomatous polyp 04/2008    rec rpt colon 3 yrs ([facility])  . 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 Foot fracture, right 2012    ?nonunion  . 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 Smoker 1969     Review of Systems Per HPI    Objective:   Physical Exam  Nursing note and vitals reviewed. Constitutional: She appears well-developed and well-nourished.  Grimaces in pain with movements  Musculoskeletal: She exhibits edema (tr).  Leg pain bilaterally - ttp at calves, popliteal area, buttock, and sciatic notch. No pain at lateral or anterior thighs No significant joint pains  Neurological:  Walks with cane.  Skin: Skin is warm and dry. No rash noted.       Assessment & Plan:

## 2013-02-08 NOTE — Telephone Encounter (Signed)
Ok to refill 

## 2013-02-08 NOTE — Telephone Encounter (Signed)
plz phone in and notify patient. however let her know she is early on her refill.

## 2013-02-08 NOTE — Assessment & Plan Note (Signed)
Mildly elevated alk phos over the last year, along with new development of kidney insufficiency. Recent SPEP without Mspike.  Renal US stable. I will ask patient to return for further blood work to eval this - including GGT, vit D, and PTH.

## 2013-02-08 NOTE — Telephone Encounter (Signed)
Rx called in as directed. Patient's husband notified.

## 2013-02-08 NOTE — Telephone Encounter (Signed)
Pt left v/m requesting refill of alprazolam to [facility] today so pt can have med picked up prior to pharmacy closing at 6 pm today. Lyla Son said pt cancelled appt at 6 pm today due to no transportation.Please advise.pt request call back when refilled.

## 2013-02-08 NOTE — Assessment & Plan Note (Signed)
Having breakthrough pain presumed from arachnoiditis. Discussed ER is an option for uncontrolled severe pain. Again reviewed no good options for acute flare of pain as we want to avoid NSAIDs 2/2 renal insufficiency, tylenol ineffective, refuses other pain modalities such as TCAs and other antidepressants. Again discussed pain clinic referral.  Pt today seemed open to this option - will see if we can set [facility] with [facility] as [facility] is [facility] she went in the past. If not, may try another pain clinic - to discuss other pain management options.

## 2013-02-08 NOTE — Patient Instructions (Addendum)
If uncontrolled pain, ER is an option. I will see if we can get you in to see [facility] again for discussion of other pain control options given current pain is uncontrolled with current regimen. I've given you tramadol to take as needed for breakthrough leg pain. Flu shot today.

## 2013-02-08 NOTE — Telephone Encounter (Signed)
plz notify patient - I'd like her to come in at her convenience in next 1 -2 wks to recheck blood work (kidney function as well as some other tests)

## 2013-02-12 NOTE — Telephone Encounter (Signed)
Appt scheduled. Husband asked that methadone Rx's for her and him be ready to pick up at that time as well. (02/22/13)

## 2013-02-13 ENCOUNTER — Ambulatory Visit: Payer: Medicare Other | Admitting: Internal Medicine

## 2013-02-19 MED ORDER — METHADONE HCL 10 MG PO TABS: 30.0000 mg | ORAL_TABLET | Freq: Three times a day (TID) | ORAL | Status: DC | PRN

## 2013-02-19 NOTE — Addendum Note (Signed)
Addended by: [name] on: 02/19/2013 05:24 AM   Modules accepted: Orders

## 2013-02-19 NOTE — Telephone Encounter (Signed)
Rx placed up front for pick up. 

## 2013-02-19 NOTE — Telephone Encounter (Signed)
Printed and placed in Kim's box. 

## 2013-02-21 ENCOUNTER — Telehealth: Payer: Self-pay | Admitting: *Deleted

## 2013-02-21 NOTE — Telephone Encounter (Signed)
Spoke with patient. Her BP has been elevated the last few days. (150's/100's) No symptoms currently. She was asking if it was ok to take 2 lisinopril tablets to help lower it. I advised you would have to decide that. No appts available tomorrow. Ok to add on to the end of the day or wait until Friday?

## 2013-02-21 NOTE — Telephone Encounter (Signed)
Lisinopril is not on her list - has she been taking this med?  Is it plain lisinopril or lisinopril hctz combo pill (which she was previously taking).  plz update her med list. Recommend against taking 2 pills regardless.   plz verify what she is consistently taking, if lisinopril may add amlodipine 2.5mg  daily (send in #30, RF 3).  Schedule f/u appt with me for next week. Lab Results  Component Value Date   CREATININE 1.5* 12/28/2012

## 2013-02-22 ENCOUNTER — Other Ambulatory Visit: Payer: Medicare Other

## 2013-02-22 NOTE — Telephone Encounter (Signed)
Spoke with patient's husband and he said she took some of the leftover plain lisinopril yesterday. I advised to not take more than 1. BP has still been running high. Follow up scheduled for next week. Transportation is an issue for patient right now and she needs late appt. She started vomiting today. Patient's husband stated he wasn't sure how he would get to pharmacy to pick up med. Offered to call into [facility] for delivery. He was fine and appreciative with that. Advised to have her push small sips of fluid throughout the evening and if worsening-go to [facility] or come in for OV here tomorrow. He verbalized understanding. Rx called to [facility] for delivery.

## 2013-02-25 ENCOUNTER — Other Ambulatory Visit: Payer: Self-pay | Admitting: Emergency Medicine

## 2013-02-26 ENCOUNTER — Telehealth: Payer: Self-pay | Admitting: Family Medicine

## 2013-02-26 NOTE — Telephone Encounter (Signed)
Called residence and a female (?her nephew) advised she had been admitted to [facility].

## 2013-02-26 NOTE — Telephone Encounter (Signed)
[facility] Triage Call Report Triage Record Num: [id number] Operator: April Gaither Patient Name: [name] [name] Call Date & Time: 02/23/2013 5:04:01PM Patient Phone: [phone] PCP: Patient Gender: Female PCP Fax : Patient DOB: 03-14-1946 Practice Name: [facility] Reason for Call: Caller: Hillary Bow; PCP: [name] [facility]); CB#: [phone]; Call regarding Low Blood Pressure; Patient was started on Amlodipine 2.5 mg; BP has dropped to 60/40 but is now up to 90/50; Patient c/o bilateral shoulder pain, dizziness and fatigue. Emergent sxs "Pressure, fullness, squeezing sensation or pain anywhere in the chest lasting 5 minutes or more now or within the last last hour" positive per Weakness or Paralysis Protocol. Instructed spouse to call 911 for ED evaluation. Protocol(s) Used: Weakness or Paralysis Recommended Outcome per Protocol: Activate EMS 911 Reason for Outcome: Pressure, fullness, squeezing sensation or pain anywhere in the chest lasting 5 or more minutes now or within the last hour. Pain is NOT associated with taking a deep breath or a productive cough, movement, or touch to a localized area on the chest. Care Advice: ~ 02/23/2013 5:16:25PM Page 1 of 1 [email or web address]

## 2013-02-26 NOTE — Telephone Encounter (Signed)
Noted. Can we call later today for an update?

## 2013-02-26 NOTE — Telephone Encounter (Signed)
[facility] Triage Call Report Triage Record Num: [id number] Operator: Remonia Richter Patient Name: [name] [name] Call Date & Time: 02/25/2013 7:54:53PM Patient Phone: [phone] PCP: [name] Patient Gender: Female PCP Fax : [phone] Patient DOB: 08-18-1945 Practice Name: [facility] Reason for Call: Caller: Hillary Bow; PCP: [name] (Family Practice); CB#: [phone]; Call regarding Blood pressure really low; this has been going on for "several days" and not able to walk across floor due to being ligh headed, she was recently started on Amlodipine and she " bottomed out", now it not being given past few days, now weak and dizzy and not fully awake this PM 02/25/13 and given 911 disposition due to symptoms of shock per dizziness guideline, agrees and will go to ED at [facility] per protocol Protocol(s) Used: Dizziness or Vertigo Recommended Outcome per Protocol: Activate EMS 911 Reason for Outcome: New or worsening signs and symptoms that may indicate shock Care Advice: ~ 02/25/2013 8:02:08PM Page 1 of 1 [email or web address]

## 2013-03-01 ENCOUNTER — Ambulatory Visit: Payer: Medicare Other | Admitting: Family Medicine

## 2013-03-02 NOTE — Telephone Encounter (Signed)
Admitted 10/5-12/2012 with acute on chronic renal failure with Cr to 7 and UTI. Rehydrated and discharge Cr 2.14. Please call to schedule f/u with me next week.

## 2013-03-05 NOTE — Telephone Encounter (Signed)
Already has follow up scheduled.

## 2013-03-08 ENCOUNTER — Ambulatory Visit: Payer: Medicare Other | Admitting: Family Medicine

## 2013-03-08 ENCOUNTER — Other Ambulatory Visit: Payer: Self-pay | Admitting: *Deleted

## 2013-03-08 NOTE — Telephone Encounter (Signed)
Ok to refill 

## 2013-03-09 ENCOUNTER — Ambulatory Visit: Payer: Medicare Other | Admitting: Family Medicine

## 2013-03-09 MED ORDER — ALPRAZOLAM 1 MG PO TABS: ORAL_TABLET | ORAL | Status: DC

## 2013-03-09 NOTE — Telephone Encounter (Signed)
plz phone in. 

## 2013-03-09 NOTE — Telephone Encounter (Signed)
Rx called in as directed.   

## 2013-03-12 ENCOUNTER — Encounter: Payer: Self-pay | Admitting: Family Medicine

## 2013-03-12 ENCOUNTER — Ambulatory Visit (INDEPENDENT_AMBULATORY_CARE_PROVIDER_SITE_OTHER): Payer: Medicare Other | Admitting: Family Medicine

## 2013-03-12 VITALS — BP 116/72 | HR 60 | Temp 97.5°F | Wt 137.5 lb

## 2013-03-12 DIAGNOSIS — R748 Abnormal levels of other serum enzymes: Secondary | ICD-10-CM

## 2013-03-12 DIAGNOSIS — N179 Acute kidney failure, unspecified: Secondary | ICD-10-CM

## 2013-03-12 DIAGNOSIS — Z780 Asymptomatic menopausal state: Secondary | ICD-10-CM

## 2013-03-12 DIAGNOSIS — N289 Disorder of kidney and ureter, unspecified: Secondary | ICD-10-CM

## 2013-03-12 DIAGNOSIS — I10 Essential (primary) hypertension: Secondary | ICD-10-CM

## 2013-03-12 DIAGNOSIS — E0590 Thyrotoxicosis, unspecified without thyrotoxic crisis or storm: Secondary | ICD-10-CM

## 2013-03-12 DIAGNOSIS — E559 Vitamin D deficiency, unspecified: Secondary | ICD-10-CM | POA: Insufficient documentation

## 2013-03-12 MED ORDER — VITAMIN D (ERGOCALCIFEROL) 1.25 MG (50000 UNIT) PO CAPS: 50000.0000 [IU] | ORAL_CAPSULE | ORAL | Status: DC

## 2013-03-12 NOTE — Assessment & Plan Note (Signed)
Stable off meds - continue to monitor. 

## 2013-03-12 NOTE — Assessment & Plan Note (Addendum)
Recent episodes of ATN- with kidney function persistently elevated.   Recheck renal panel today. Latest episode due to ATN from dehydration. Recent SPEP negative, prior renal US showing R anigomyolipoma - I have requested records from recent renal ultrasound in hospital.

## 2013-03-12 NOTE — Progress Notes (Addendum)
Subjective:    Patient ID: [name] [name], female    DOB: September 09, 1945, 67 y.o.   MRN: [id number]  HPI CC: hospital f/u  F/u phone call 03/05/2013  [name] [name] presents today for f/u recent hospitalization from 02/25/2013 to 02/28/2013 with admitting complaint of weakness and discharge diagnosis of ARF due to ATN 2/2 dehydration, UTI.  She had RIJ central line placed 2/2 difficulty placing IV.  Admission Cr 7.2, discharge Cr 2.14 after IV hydration.  Prior to hospitalization, Cr 1.5. Renal US at hospitalization showed only simple cyst.  She has known h/o R renal angiomolipoma - I have requested copy of report to compare. PTH elevated to 158, vit D low at 14. TSH 0.276, free T3/T4 WNL.  Since home, feeling well.  Records reviewed.  She has not had dexa scan.  Medications and allergies reviewed and updated in chart.  Past histories reviewed and updated if relevant as below. Patient Active Problem List   Diagnosis Date Noted  . CKD (chronic kidney disease) 03/15/2013  . Vitamin D deficiency 03/12/2013  . Elevated alkaline phosphatase level 02/08/2013  . Falls 01/17/2013  . Diarrhea 01/17/2013  . Smoker   . Dyspnea 12/28/2012  . Hoarseness 12/28/2012  . 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 Angiomyolipoma of right kidney   . ARF (acute renal failure) 11/08/2012  . Lower extremity edema 11/01/2012  . CAD (coronary artery disease) 08/09/2012  . Osteoarthritis of both knees   . DDD (degenerative disc disease), lumbar   . Hypertension   . 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 H/O: CVA (cerebrovascular accident)   . Anxiety   . HLD (hyperlipidemia)   . Chronic back pain    Past Medical History  Diagnosis Date  . Arachnoiditis 1970    due to her Pantopaque myelograms in the 1970s, evidence of this on CT scan 11/2002  . Hypertension   . MVP (mitral valve prolapse)     h/o scarlet fever  . COPD (chronic obstructive pulmonary disease) 2003    by CXR, chronic bronchitis  . HLD  (hyperlipidemia)   . Colon polyps   . H/O: CVA (cerebrovascular accident)     TIA  . Urine incontinence   . Anxiety     severe with panic attacks  . CAD (coronary artery disease) 2002    catheterization: 60% RCA stenosis, LAD 30%, L circ 25%  . Hydradenitis 2001    chronic, perineal s/p surgeries ([facility]) ?sweet's syndrome  . Psychogenic syncope 2003  . Orthostatic hypotension 2002  . DDD (degenerative disc disease), lumbar 2003    mult HNP with radiculopathy s/p mult surgeries and fusion  . Chronic pain syndrome     high dose methadone since hospitalization 05/16/2002, has not tolerated lyrica, cymbalta, zanaflex  . Chronic back pain     failed ESI and sympathetic blocks  . Osteoarthritis of both knees     severe  . Aspiration pneumonia 02/2012    after overdose  . 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 Adenomatous polyp 04/2008    rec rpt colon 3 yrs ([facility])  . 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 Foot fracture, right 2012    ?nonunion  . 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 Smoker 1969   Past Surgical History  Procedure Laterality Date  . Laminectomy  1976,1977,1999,2003    x  4  . Abdominal hysterectomy  1985  . Appendectomy  1963  . Breast biopsy  1966  . Cardiac catheterization  2004  . Lumbar fusion  2003    L4/5 laminotomy with posterior lumbar interbody fusion ([facility])  . Knee arthroscopy Left     Erik Obey  . Colonoscopy  04/2008    tubular adenoma, int hem, rec rpt 3 yrs - Dr. Bosie Clos with [facility]   History  Substance Use Topics  . Smoking status: Current Every Day Smoker -- 0.25 packs/day for 45 years    Types: Cigarettes    Start date: 05/25/1967  . Smokeless tobacco: Never Used  . Alcohol Use: No   Family History  Problem Relation Age of Onset  . Arthritis    . CAD Mother      MI  . CAD Father     MI s/p 3v CABG  . Kidney failure Father     ESRD on HD  . Stroke Mother   . Cancer Neg Hx   . Diabetes Neg Hx   . Deep vein thrombosis Mother   . Deep vein thrombosis Father   . Deep vein thrombosis Paternal Grandfather    Allergies  Allergen Reactions  . Penicillins Anaphylaxis  . Sulfa Antibiotics Anaphylaxis  . Sertraline Hcl Other (See Comments)    Caused pt to be hyper and confused  . Beta Adrenergic Blockers     Bradycardia, orthostatic hypotension  . Other     ALL ANTI DEPRESSANTS; MUSHROOMS  . Toradol [Ketorolac Tromethamine] Nausea And Vomiting  . Tylenol [Acetaminophen] Nausea And Vomiting  . Versed [Midazolam]     DID NOT WORK  . Codeine Itching and Rash   Current Outpatient Prescriptions on File Prior to Visit  Medication Sig Dispense Refill  . albuterol (PROVENTIL HFA;VENTOLIN HFA) 108 (90 BASE) MCG/ACT inhaler Inhale 2 puffs into the lungs every 6 (six) hours as needed for wheezing.  1 Inhaler  2  . ALPRAZolam (XANAX) 1 MG tablet TAKE ONE TABLET FOUR TIMES DAILY AS NEEDED FOR ANXIETY  120 tablet  0  . aspirin 81 MG chewable tablet Chew 1 tablet (81 mg total) by mouth daily.      . furosemide (LASIX) 20 MG tablet Take 1 tablet (20 mg total) by mouth daily as needed (swelling).  30 tablet  0  . gabapentin (NEURONTIN) 300 MG capsule Take by mouth See admin instructions. 600 mg at lunch and 900 mg at bedtime      . methadone (DOLOPHINE) 10 MG tablet Take 3-4 tablets (30-40 mg total) by mouth every 8 (eight) hours as needed for pain.  330 tablet  0  . Multiple Vitamin (MULTIVITAMIN WITH MINERALS) TABS Take 1 tablet by mouth daily.      . traMADol (ULTRAM) 50 MG tablet Take 1 tablet (50 mg total) by mouth every 8 (eight) hours as needed (breakthrough pain).  30 tablet  0  . [DISCONTINUED] diphenhydrAMINE (BENADRYL) 25 MG tablet Take 25 mg by mouth at bedtime as needed. For allergy symptoms       No current facility-administered medications on  file prior to visit.    Review of Systems Per HPI    Objective:   Physical Exam  Nursing note and vitals reviewed. Constitutional: She appears well-developed and well-nourished. No distress.  HENT:  Mouth/Throat: Oropharynx is clear and moist. No oropharyngeal exudate.  Cardiovascular: Normal rate, regular rhythm, normal heart sounds and intact distal pulses.   No murmur heard. Pulmonary/Chest: Effort  normal and breath sounds normal. No respiratory distress. She has no wheezes. She has no rales.  Musculoskeletal: She exhibits no edema.  Senile kyphosis       Assessment & Plan:  Check DEXA scan as patient is due

## 2013-03-12 NOTE — Assessment & Plan Note (Signed)
Start 50,000 unit weekly for 8 wks (02/2013) then start oral supplement daily.

## 2013-03-12 NOTE — Patient Instructions (Addendum)
[name] [name] please obtain report of kidney ultrasound done at [facility]. Start vitamin D prescription strength once weekly for 8 weeks then start 2000 units vitamin D daily (over the counter) Blood work today to check kidneys and thyroid. We will call you to set up bone density scan. Good to see you today Return to see me in 3 months for medicare wellness visit as you're due.

## 2013-03-13 LAB — RENAL FUNCTION PANEL
Albumin: 3.2 g/dL — ABNORMAL LOW (ref 3.5–5.2)
BUN: 27 mg/dL — ABNORMAL HIGH (ref 6–23)
Calcium: 10 mg/dL (ref 8.4–10.5)
Chloride: 104 mEq/L (ref 96–112)
Glucose, Bld: 54 mg/dL — ABNORMAL LOW (ref 70–99)
Phosphorus: 2.9 mg/dL (ref 2.3–4.6)
Potassium: 4.9 mEq/L (ref 3.5–5.1)

## 2013-03-13 LAB — GAMMA GT: GGT: 26 U/L (ref 7–51)

## 2013-03-13 NOTE — Addendum Note (Signed)
Addended by: Alvina Chou on: 03/13/2013 08:23 AM   Modules accepted: Orders

## 2013-03-14 ENCOUNTER — Telehealth: Payer: Self-pay

## 2013-03-14 DIAGNOSIS — N179 Acute kidney failure, unspecified: Secondary | ICD-10-CM

## 2013-03-14 DIAGNOSIS — N183 Chronic kidney disease, stage 3 (moderate): Secondary | ICD-10-CM

## 2013-03-14 NOTE — Telephone Encounter (Signed)
Pt left v/m requesting cb at [phone] when 03/12/13 lab results available.

## 2013-03-15 NOTE — Telephone Encounter (Signed)
Patient notified

## 2013-03-15 NOTE — Telephone Encounter (Signed)
See result note.  

## 2013-03-22 ENCOUNTER — Other Ambulatory Visit: Payer: Self-pay

## 2013-03-22 DIAGNOSIS — G030 Nonpyogenic meningitis: Secondary | ICD-10-CM

## 2013-03-22 DIAGNOSIS — G8929 Other chronic pain: Secondary | ICD-10-CM

## 2013-03-22 MED ORDER — METHADONE HCL 10 MG PO TABS: 30.0000 mg | ORAL_TABLET | Freq: Three times a day (TID) | ORAL | Status: DC | PRN

## 2013-03-22 NOTE — Telephone Encounter (Signed)
Pt left v/m requesting rx methadone. Pt has someone to pick up rx this week but will not have transportation next week so needs to pick up rx this week. Call when rx ready for pickup.

## 2013-03-22 NOTE — Telephone Encounter (Signed)
Printed and placed in Kim's box. 

## 2013-03-23 ENCOUNTER — Telehealth: Payer: Self-pay

## 2013-03-23 DIAGNOSIS — E46 Unspecified protein-calorie malnutrition: Secondary | ICD-10-CM | POA: Insufficient documentation

## 2013-03-23 MED ORDER — ENSURE PO LIQD: 1.0000 | Freq: Every day | ORAL | Status: DC

## 2013-03-23 NOTE — Telephone Encounter (Signed)
This is OTC. I doubt ensure will be covered.  I have sent request to [facility].

## 2013-03-23 NOTE — Telephone Encounter (Signed)
Patient's husband notified and Rx placed up front for patient to pick up.

## 2013-03-23 NOTE — Telephone Encounter (Signed)
Pt left v/m that 3 weeks ago in hospital for dehydration; pt said ensure helped. Pt cannot drink water because water nauseates pt and request prescription for ensure sent to [facility] . Ensure is expensive and pt hopes medicare will help pay for ensure.

## 2013-04-06 ENCOUNTER — Other Ambulatory Visit: Payer: Self-pay | Admitting: *Deleted

## 2013-04-06 NOTE — Telephone Encounter (Signed)
Ok to refill 

## 2013-04-08 MED ORDER — ALPRAZOLAM 1 MG PO TABS: ORAL_TABLET | ORAL | Status: DC

## 2013-04-08 NOTE — Telephone Encounter (Signed)
plz phone in. 

## 2013-04-09 ENCOUNTER — Encounter: Payer: Self-pay | Admitting: Family Medicine

## 2013-04-09 ENCOUNTER — Ambulatory Visit (INDEPENDENT_AMBULATORY_CARE_PROVIDER_SITE_OTHER): Payer: Medicare Other | Admitting: Family Medicine

## 2013-04-09 VITALS — BP 136/90 | HR 84 | Temp 97.9°F | Wt 133.5 lb

## 2013-04-09 DIAGNOSIS — N189 Chronic kidney disease, unspecified: Secondary | ICD-10-CM

## 2013-04-09 DIAGNOSIS — M1710 Unilateral primary osteoarthritis, unspecified knee: Secondary | ICD-10-CM

## 2013-04-09 DIAGNOSIS — M170 Bilateral primary osteoarthritis of knee: Secondary | ICD-10-CM

## 2013-04-09 NOTE — Patient Instructions (Addendum)
Limit gabapentin to 6 pills daily. Kidneys are some better but still showing some impairment - we will need to monitor closely. Return in 3-4 months for follow up

## 2013-04-09 NOTE — Assessment & Plan Note (Signed)
Referral to ortho placed.  Prior eval thought worsening arthritis vs meniscal injury, pt had declined ortho referral at that time as well as steroid injection and several other interventions.

## 2013-04-09 NOTE — Progress Notes (Signed)
Pre-visit discussion using our clinic review tool. No additional management support is needed unless otherwise documented below in the visit note.  

## 2013-04-09 NOTE — Addendum Note (Signed)
Addended by: Annamarie Major on: 04/09/2013 04:57 PM   Modules accepted: Orders

## 2013-04-09 NOTE — Progress Notes (Signed)
  Subjective:    Patient ID: [name] [name], female    DOB: 1945-08-17, 67 y.o.   MRN: [id number]  HPI CC: discuss renal doctor visit  Saw nephrologist - kidney function improving.  rec f/u 6 mo, unsure if will return. Pt increased gabapentin on her own 2/2 worsening burning leg pain - to 600mg  at lunch and 900mg  at dinner. States she takes up to 8-12 tablets 300mg  per day.  Bilateral knee pain - would like referral to orthopedist at [facility] - to see Dr. Zachery Dauer 04/27/2013.  Already scheduled to see him, requests referral today. Continued falls - knees give out on her.  Xray from 08/2012: LEFT KNEE - COMPLETE 4+ VIEW  Comparison: None  Correlation: MRI left knee 11/22/2006  Findings:  Mild osseous demineralization.  Joint space narrowing and marginal spur formation right compartmental.  Slight lateral patellar tilt.  No acute fracture, dislocation, or bone destruction.  No definite knee joint effusion.  IMPRESSION:  Osteoarthritic changes left knee.    Past Medical History  Diagnosis Date  . Arachnoiditis 1970    due to her Pantopaque myelograms in the 1970s, evidence of this on CT scan 11/2002  . Hypertension   . MVP (mitral valve prolapse)     h/o scarlet fever  . COPD (chronic obstructive pulmonary disease) 2003    by CXR, chronic bronchitis  . HLD (hyperlipidemia)   . Colon polyps   . H/O: CVA (cerebrovascular accident)     TIA  . Urine incontinence   . Anxiety     severe with panic attacks  . CAD (coronary artery disease) 2002    catheterization: 60% RCA stenosis, LAD 30%, L circ 25%  . Hydradenitis 2001    chronic, perineal s/p surgeries ([facility]) ?sweet's syndrome  . Psychogenic syncope 2003  . Orthostatic hypotension 2002  . DDD (degenerative disc disease), lumbar 2003    mult HNP with radiculopathy s/p mult surgeries and fusion  . Chronic pain syndrome     high dose methadone since hospitalization 05/16/2002, has not tolerated lyrica, cymbalta, zanaflex  .  Chronic back pain     failed ESI and sympathetic blocks  . Osteoarthritis of both knees     severe  . Aspiration pneumonia 02/2012    after overdose  . 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 Adenomatous polyp 04/2008    rec rpt colon 3 yrs ([facility])  . 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 Foot fracture, right 2012    ?nonunion  . 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 Smoker 1969     Review of Systems Per HPI    Objective:   Physical Exam  Nursing note and vitals reviewed. Constitutional: She appears well-developed and well-nourished. No distress.  HENT:  Mouth/Throat: Oropharynx is clear and moist. No oropharyngeal exudate.  Cardiovascular: Normal rate, regular rhythm, normal heart sounds and intact distal pulses.   No murmur heard. Pulmonary/Chest: Effort normal and breath sounds normal. No respiratory distress. She has no wheezes. She has no rales.  Musculoskeletal: She exhibits no edema.       Assessment & Plan:

## 2013-04-09 NOTE — Telephone Encounter (Signed)
Pt left v/m requesting status alprazolam refill; Medication phoned to [facility] as instructed.pt notified done.

## 2013-04-09 NOTE — Assessment & Plan Note (Signed)
S/p nephrology evaluation - at that time Cr was improved to 1.3.  Merits continued close f/u.  Will recheck in 3 months.

## 2013-04-18 ENCOUNTER — Ambulatory Visit: Payer: Medicare Other | Admitting: Family Medicine

## 2013-04-18 ENCOUNTER — Other Ambulatory Visit: Payer: Self-pay | Admitting: *Deleted

## 2013-04-18 DIAGNOSIS — G8929 Other chronic pain: Secondary | ICD-10-CM

## 2013-04-18 DIAGNOSIS — G030 Nonpyogenic meningitis: Secondary | ICD-10-CM

## 2013-04-23 ENCOUNTER — Ambulatory Visit: Payer: Medicare Other | Admitting: Family Medicine

## 2013-04-23 MED ORDER — METHADONE HCL 10 MG PO TABS: 30.0000 mg | ORAL_TABLET | Freq: Three times a day (TID) | ORAL | Status: DC | PRN

## 2013-04-23 NOTE — Telephone Encounter (Signed)
Script printed and placed in Kim's box. 

## 2013-04-23 NOTE — Telephone Encounter (Signed)
Patient's husband notified that Rx was ready for pick up. Placed up front. Advised to bring ID.

## 2013-04-23 NOTE — Telephone Encounter (Signed)
Pt left v/m requesting cb about picking up methadone rx.

## 2013-04-25 ENCOUNTER — Observation Stay (HOSPITAL_COMMUNITY)
Admission: EM | Admit: 2013-04-25 | Discharge: 2013-04-27 | Disposition: A | Discharge status: Home / Self Care | Payer: Medicare Other | Attending: Internal Medicine | Admitting: Internal Medicine

## 2013-04-25 ENCOUNTER — Emergency Department (HOSPITAL_COMMUNITY): Payer: Medicare Other

## 2013-04-25 ENCOUNTER — Encounter (HOSPITAL_COMMUNITY): Payer: Self-pay | Admitting: Emergency Medicine

## 2013-04-25 DIAGNOSIS — J4489 Other specified chronic obstructive pulmonary disease: Secondary | ICD-10-CM | POA: Insufficient documentation

## 2013-04-25 DIAGNOSIS — E785 Hyperlipidemia, unspecified: Secondary | ICD-10-CM | POA: Insufficient documentation

## 2013-04-25 DIAGNOSIS — E46 Unspecified protein-calorie malnutrition: Secondary | ICD-10-CM

## 2013-04-25 DIAGNOSIS — I951 Orthostatic hypotension: Secondary | ICD-10-CM | POA: Insufficient documentation

## 2013-04-25 DIAGNOSIS — Z860100 Personal history of colon polyps, unspecified: Secondary | ICD-10-CM | POA: Insufficient documentation

## 2013-04-25 DIAGNOSIS — R0600 Dyspnea, unspecified: Secondary | ICD-10-CM

## 2013-04-25 DIAGNOSIS — R0789 Other chest pain: Principal | ICD-10-CM | POA: Insufficient documentation

## 2013-04-25 DIAGNOSIS — I2510 Atherosclerotic heart disease of native coronary artery without angina pectoris: Secondary | ICD-10-CM | POA: Diagnosis present

## 2013-04-25 DIAGNOSIS — G894 Chronic pain syndrome: Secondary | ICD-10-CM | POA: Diagnosis present

## 2013-04-25 DIAGNOSIS — M170 Bilateral primary osteoarthritis of knee: Secondary | ICD-10-CM

## 2013-04-25 DIAGNOSIS — R748 Abnormal levels of other serum enzymes: Secondary | ICD-10-CM

## 2013-04-25 DIAGNOSIS — N189 Chronic kidney disease, unspecified: Secondary | ICD-10-CM

## 2013-04-25 DIAGNOSIS — IMO0002 Reserved for concepts with insufficient information to code with codable children: Secondary | ICD-10-CM | POA: Insufficient documentation

## 2013-04-25 DIAGNOSIS — J449 Chronic obstructive pulmonary disease, unspecified: Secondary | ICD-10-CM | POA: Insufficient documentation

## 2013-04-25 DIAGNOSIS — Z8673 Personal history of transient ischemic attack (TIA), and cerebral infarction without residual deficits: Secondary | ICD-10-CM

## 2013-04-25 DIAGNOSIS — M549 Dorsalgia, unspecified: Secondary | ICD-10-CM | POA: Insufficient documentation

## 2013-04-25 DIAGNOSIS — D3001 Benign neoplasm of right kidney: Secondary | ICD-10-CM

## 2013-04-25 DIAGNOSIS — Z7982 Long term (current) use of aspirin: Secondary | ICD-10-CM | POA: Insufficient documentation

## 2013-04-25 DIAGNOSIS — Z880 Allergy status to penicillin: Secondary | ICD-10-CM | POA: Insufficient documentation

## 2013-04-25 DIAGNOSIS — R079 Chest pain, unspecified: Secondary | ICD-10-CM | POA: Diagnosis present

## 2013-04-25 DIAGNOSIS — G8929 Other chronic pain: Secondary | ICD-10-CM | POA: Insufficient documentation

## 2013-04-25 DIAGNOSIS — G039 Meningitis, unspecified: Secondary | ICD-10-CM | POA: Insufficient documentation

## 2013-04-25 DIAGNOSIS — I10 Essential (primary) hypertension: Secondary | ICD-10-CM | POA: Insufficient documentation

## 2013-04-25 DIAGNOSIS — F419 Anxiety disorder, unspecified: Secondary | ICD-10-CM

## 2013-04-25 DIAGNOSIS — M5136 Other intervertebral disc degeneration, lumbar region: Secondary | ICD-10-CM

## 2013-04-25 DIAGNOSIS — I059 Rheumatic mitral valve disease, unspecified: Secondary | ICD-10-CM | POA: Insufficient documentation

## 2013-04-25 DIAGNOSIS — F411 Generalized anxiety disorder: Secondary | ICD-10-CM | POA: Insufficient documentation

## 2013-04-25 DIAGNOSIS — M1710 Unilateral primary osteoarthritis, unspecified knee: Secondary | ICD-10-CM | POA: Insufficient documentation

## 2013-04-25 DIAGNOSIS — Z8781 Personal history of (healed) traumatic fracture: Secondary | ICD-10-CM | POA: Insufficient documentation

## 2013-04-25 DIAGNOSIS — R600 Localized edema: Secondary | ICD-10-CM

## 2013-04-25 DIAGNOSIS — D352 Benign neoplasm of pituitary gland: Secondary | ICD-10-CM

## 2013-04-25 DIAGNOSIS — N179 Acute kidney failure, unspecified: Secondary | ICD-10-CM

## 2013-04-25 DIAGNOSIS — F458 Other somatoform disorders: Secondary | ICD-10-CM | POA: Insufficient documentation

## 2013-04-25 DIAGNOSIS — R21 Rash and other nonspecific skin eruption: Secondary | ICD-10-CM | POA: Diagnosis present

## 2013-04-25 DIAGNOSIS — Z79899 Other long term (current) drug therapy: Secondary | ICD-10-CM | POA: Insufficient documentation

## 2013-04-25 DIAGNOSIS — G030 Nonpyogenic meningitis: Secondary | ICD-10-CM

## 2013-04-25 DIAGNOSIS — F17200 Nicotine dependence, unspecified, uncomplicated: Secondary | ICD-10-CM

## 2013-04-25 DIAGNOSIS — E559 Vitamin D deficiency, unspecified: Secondary | ICD-10-CM

## 2013-04-25 DIAGNOSIS — R55 Syncope and collapse: Secondary | ICD-10-CM | POA: Diagnosis present

## 2013-04-25 DIAGNOSIS — R490 Dysphonia: Secondary | ICD-10-CM

## 2013-04-25 DIAGNOSIS — L732 Hidradenitis suppurativa: Secondary | ICD-10-CM | POA: Insufficient documentation

## 2013-04-25 DIAGNOSIS — Z9861 Coronary angioplasty status: Secondary | ICD-10-CM | POA: Insufficient documentation

## 2013-04-25 DIAGNOSIS — R197 Diarrhea, unspecified: Secondary | ICD-10-CM

## 2013-04-25 HISTORY — DX: Adverse effect of unspecified anesthetic, initial encounter: T41.45XA

## 2013-04-25 HISTORY — DX: Other complications of anesthesia, initial encounter: T88.59XA

## 2013-04-25 LAB — CBC WITH DIFFERENTIAL/PLATELET
Basophils Absolute: 0 10*3/uL (ref 0.0–0.1)
Eosinophils Absolute: 0.1 10*3/uL (ref 0.0–0.7)
Eosinophils Relative: 1 % (ref 0–5)
Hemoglobin: 14.4 g/dL (ref 12.0–15.0)
Lymphs Abs: 1.1 10*3/uL (ref 0.7–4.0)
MCH: 31 pg (ref 26.0–34.0)
MCV: 92.9 fL (ref 78.0–100.0)
Monocytes Absolute: 0.7 10*3/uL (ref 0.1–1.0)
Monocytes Relative: 6 % (ref 3–12)
RBC: 4.64 MIL/uL (ref 3.87–5.11)

## 2013-04-25 LAB — BASIC METABOLIC PANEL
BUN: 28 mg/dL — ABNORMAL HIGH (ref 6–23)
Calcium: 10.5 mg/dL (ref 8.4–10.5)
Creatinine, Ser: 1.62 mg/dL — ABNORMAL HIGH (ref 0.50–1.10)
GFR calc Af Amer: 37 mL/min — ABNORMAL LOW (ref >90)
GFR calc non Af Amer: 32 mL/min — ABNORMAL LOW (ref >90)
Glucose, Bld: 138 mg/dL — ABNORMAL HIGH (ref 70–99)

## 2013-04-25 LAB — POCT I-STAT TROPONIN I: Troponin i, poc: 0.01 ng/mL (ref 0.00–0.08)

## 2013-04-25 MED ORDER — GABAPENTIN 600 MG PO TABS
600.0000 mg | ORAL_TABLET | Freq: Every morning | ORAL | Status: DC
  Administered 2013-04-26: 600 mg via ORAL
  Filled 2013-04-25 (×2): qty 1

## 2013-04-25 MED ORDER — GI COCKTAIL ~~LOC~~: 30.0000 mL | Freq: Four times a day (QID) | ORAL | Status: DC | PRN

## 2013-04-25 MED ORDER — HYDROMORPHONE HCL PF 1 MG/ML IJ SOLN: 0.5000 mg | INTRAMUSCULAR | Status: DC | PRN

## 2013-04-25 MED ORDER — DIPHENHYDRAMINE HCL 25 MG PO CAPS
25.0000 mg | ORAL_CAPSULE | Freq: Once | ORAL | Status: AC
  Administered 2013-04-25: 25 mg via ORAL
  Filled 2013-04-25: qty 1

## 2013-04-25 MED ORDER — GABAPENTIN 300 MG PO CAPS
600.0000 mg | ORAL_CAPSULE | Freq: Every day | ORAL | Status: DC
  Administered 2013-04-26 – 2013-04-27 (×2): 600 mg via ORAL
  Filled 2013-04-25 (×2): qty 2

## 2013-04-25 MED ORDER — GABAPENTIN 300 MG PO CAPS
900.0000 mg | ORAL_CAPSULE | Freq: Every day | ORAL | Status: DC
  Administered 2013-04-26 (×2): 900 mg via ORAL
  Filled 2013-04-25 (×3): qty 3

## 2013-04-25 MED ORDER — NITROGLYCERIN 0.4 MG SL SUBL
0.4000 mg | SUBLINGUAL_TABLET | SUBLINGUAL | Status: DC | PRN
  Administered 2013-04-25: 0.4 mg via SUBLINGUAL
  Filled 2013-04-25: qty 25

## 2013-04-25 MED ORDER — HEPARIN SODIUM (PORCINE) 5000 UNIT/ML IJ SOLN
5000.0000 [IU] | Freq: Three times a day (TID) | INTRAMUSCULAR | Status: DC
  Administered 2013-04-26 – 2013-04-27 (×5): 5000 [IU] via SUBCUTANEOUS
  Filled 2013-04-25 (×8): qty 1

## 2013-04-25 MED ORDER — ONDANSETRON HCL 4 MG/2ML IJ SOLN
4.0000 mg | Freq: Four times a day (QID) | INTRAMUSCULAR | Status: DC | PRN
  Filled 2013-04-25: qty 2

## 2013-04-25 MED ORDER — SODIUM CHLORIDE 0.9 % IV BOLUS (SEPSIS)
500.0000 mL | Freq: Once | INTRAVENOUS | Status: AC
  Administered 2013-04-25: 500 mL via INTRAVENOUS

## 2013-04-25 MED ORDER — ALPRAZOLAM 0.5 MG PO TABS
1.0000 mg | ORAL_TABLET | Freq: Every evening | ORAL | Status: DC | PRN
  Administered 2013-04-26 (×2): 1 mg via ORAL
  Filled 2013-04-25 (×2): qty 2

## 2013-04-25 MED ORDER — ACETAMINOPHEN 325 MG PO TABS: 650.0000 mg | ORAL_TABLET | ORAL | Status: DC | PRN

## 2013-04-25 MED ORDER — ASPIRIN 81 MG PO CHEW
81.0000 mg | CHEWABLE_TABLET | Freq: Every day | ORAL | Status: DC
  Administered 2013-04-26 – 2013-04-27 (×2): 81 mg via ORAL
  Filled 2013-04-25 (×2): qty 1

## 2013-04-25 MED ORDER — METHADONE HCL 10 MG PO TABS
30.0000 mg | ORAL_TABLET | Freq: Three times a day (TID) | ORAL | Status: DC | PRN
  Administered 2013-04-26: 30 mg via ORAL
  Administered 2013-04-26: 40 mg via ORAL
  Filled 2013-04-25: qty 4
  Filled 2013-04-25: qty 3

## 2013-04-25 MED ORDER — ALBUTEROL SULFATE HFA 108 (90 BASE) MCG/ACT IN AERS: 2.0000 | INHALATION_SPRAY | Freq: Four times a day (QID) | RESPIRATORY_TRACT | Status: DC | PRN

## 2013-04-25 NOTE — ED Notes (Signed)
Pt husband came out to tell me she is leaving.  Spoke to pt who said she was going home. Advised against that, asked her to remain in room until I could have the physician come in and speak to pt.

## 2013-04-25 NOTE — ED Provider Notes (Signed)
CSN: [id number]     Arrival date & time 04/25/13  1646 History   First MD Initiated Contact with Patient 04/25/13 1652     Chief Complaint  Patient presents with  . Chest Pain   (Consider location/radiation/quality/duration/timing/severity/associated sxs/prior Treatment) HPI  Chief complaint: Chest pain  Patient's a 67 year old female past medical history of COPD, CAD, hyperlipidemia, hypertension who comes in chief complaint of chest pain. Patient states she had onset of chest pain last night. Has been waxing and waning since. Describes pain as a pressure. Located over the sternum and left-sided chest without radiation. Mild to moderate in severity. Patient denies any shortness of breath or diaphoresis. Given ASA with EMS. No other treatments tried.   Past Medical History  Diagnosis Date  . Arachnoiditis 1970    due to her Pantopaque myelograms in the 1970s, evidence of this on CT scan 11/2002  . Hypertension   . MVP (mitral valve prolapse)     h/o scarlet fever  . COPD (chronic obstructive pulmonary disease) 2003    by CXR, chronic bronchitis  . HLD (hyperlipidemia)   . Colon polyps   . H/O: CVA (cerebrovascular accident)     TIA  . Urine incontinence   . Anxiety     severe with panic attacks  . CAD (coronary artery disease) 2002    catheterization: 60% RCA stenosis, LAD 30%, L circ 25%  . Hydradenitis 2001    chronic, perineal s/p surgeries ([facility]) ?sweet's syndrome  . Psychogenic syncope 2003  . Orthostatic hypotension 2002  . DDD (degenerative disc disease), lumbar 2003    mult HNP with radiculopathy s/p mult surgeries and fusion  . Chronic pain syndrome     high dose methadone since hospitalization 05/16/2002, has not tolerated lyrica, cymbalta, zanaflex  . Chronic back pain     failed ESI and sympathetic blocks  . Osteoarthritis of both knees     severe  . Aspiration pneumonia 02/2012    after overdose  . Pituitary microadenoma 07/2012    possible by MRI most  recent hospitalization, started on hydrocortisone for concern for adrenal insufficiency  . Adenomatous polyp 04/2008    rec rpt colon 3 yrs ([facility])  . 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 Foot fracture, right 2012    ?nonunion  . 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 Smoker 1969   Past Surgical History  Procedure Laterality Date  . Laminectomy  1976,1977,1999,2003    x 4  . Abdominal hysterectomy  1985  . Appendectomy  1963  . Breast biopsy  1966  . Cardiac catheterization  2004  . Lumbar fusion  2003    L4/5 laminotomy with posterior lumbar interbody fusion ([facility])  . Knee arthroscopy Left     Erik Obey  . Colonoscopy  04/2008    tubular adenoma, int hem, rec rpt 3 yrs - Dr. Bosie Clos with [facility]   Family History  Problem Relation Age of Onset  . Arthritis    . CAD Mother     MI  . CAD Father     MI s/p 3v CABG  . Kidney failure Father     ESRD on HD  . Stroke Mother   . Cancer Neg Hx   . Diabetes Neg Hx   . Deep vein thrombosis Mother   . Deep vein thrombosis Father   . Deep vein thrombosis Paternal Grandfather    History  Substance Use Topics  . Smoking status: Current Every Day Smoker -- 0.25 packs/day for 45 years    Types: Cigarettes    Start date: 05/25/1967  . Smokeless tobacco: Never Used  . Alcohol Use: No   OB History   Grav Para Term Preterm Abortions TAB SAB Ect Mult Living                 Review of Systems  Constitutional: Negative for fatigue.  Respiratory: Negative for shortness of breath.   Cardiovascular: Positive for chest pain.  Gastrointestinal: Negative for abdominal pain.  Genitourinary: Negative for dysuria.  Musculoskeletal: Positive for back pain (chronic pain).  Skin: Negative for rash.  Neurological: Negative for headaches.  Psychiatric/Behavioral: Negative for agitation.  All other systems reviewed and are negative.    Allergies  Penicillins; Sulfa  antibiotics; Sertraline hcl; Beta adrenergic blockers; Mushroom extract complex; Other; Toradol; Tylenol; Versed; and Codeine  Home Medications   Current Outpatient Rx  Name  Route  Sig  Dispense  Refill  . albuterol (PROVENTIL HFA;VENTOLIN HFA) 108 (90 BASE) MCG/ACT inhaler   Inhalation   Inhale 2 puffs into the lungs every 6 (six) hours as needed for wheezing.   1 Inhaler   2   . ALPRAZolam (XANAX) 1 MG tablet      TAKE ONE TABLET FOUR TIMES DAILY AS NEEDED FOR ANXIETY   120 tablet   0   . aspirin 81 MG chewable tablet   Oral   Chew 1 tablet (81 mg total) by mouth daily.         . Calcium Carb-Cholecalciferol (CALCIUM-VITAMIN D) 600-400 MG-UNIT TABS   Oral   Take 1 tablet by mouth 2 (two) times daily.         . furosemide (LASIX) 20 MG tablet   Oral   Take 1 tablet (20 mg total) by mouth daily as needed (swelling).   30 tablet   0   . gabapentin (NEURONTIN) 300 MG capsule   Oral   Take by mouth See admin instructions. 600 mg at lunch and 900 mg at bedtime         . methadone (DOLOPHINE) 10 MG tablet   Oral   Take 30-40 mg by mouth every 8 (eight) hours as needed for moderate pain.         . Multiple Vitamin (MULTIVITAMIN WITH MINERALS) TABS   Oral   Take 1 tablet by mouth daily.          BP 99/55  Pulse 58  Resp 16  SpO2 97% Physical Exam  Nursing note and vitals reviewed. Constitutional: She is oriented to person, place, and time. She appears well-developed and well-nourished.  HENT:  Head: Normocephalic and atraumatic.  Eyes: EOM are normal. Pupils are equal, round, and reactive to light.  Neck: Normal range of motion.  Cardiovascular: Normal rate, regular rhythm and intact distal pulses.   Pulmonary/Chest: Effort normal and breath sounds normal. No respiratory distress. She exhibits tenderness (chest wall tenderness palpation).  Abdominal: Soft. She exhibits no distension. There is no tenderness. There is no rebound and no guarding.   Musculoskeletal: Normal range of motion.  Neurological: She is alert and oriented to person, place, and time. No cranial nerve deficit. She exhibits normal muscle tone. Coordination normal.  Skin: Skin is warm and dry.  Psychiatric: She has a normal mood and affect.    ED Course  Procedures (including critical care time) Labs Review Labs Reviewed  CBC WITH DIFFERENTIAL -  Abnormal; Notable for the following:    WBC 11.0 (*)    Neutrophils Relative % 84 (*)    Neutro Abs 9.2 (*)    Lymphocytes Relative 10 (*)    All other components within normal limits  BASIC METABOLIC PANEL - Abnormal; Notable for the following:    Potassium 3.4 (*)    Glucose, Bld 138 (*)    BUN 28 (*)    Creatinine, Ser 1.62 (*)    GFR calc non Af Amer 32 (*)    GFR calc Af Amer 37 (*)    All other components within normal limits  POCT I-STAT TROPONIN I   Imaging Review Dg Chest 2 View  04/25/2013   CLINICAL DATA:  Chest pain, smoker  EXAM: CHEST  2 VIEW  COMPARISON:  07/29/2012  FINDINGS: Cardiomediastinal silhouette is stable. Again noted hyperinflation and chronic interstitial prominence. Stable infrahilar bronchitic changes. Thoracic spine osteopenia. No definite superimposed infiltrate or pulmonary edema.  IMPRESSION: Again noted hyperinflation and chronic interstitial prominence. Stable infrahilar bronchitic changes. Thoracic spine osteopenia. No definite superimposed infiltrate or pulmonary edema.   Electronically Signed   By: Natasha Mead M.D.   On: 04/25/2013 18:17    EKG Interpretation    Date/Time:  Wednesday April 25 2013 16:50:52 EST Ventricular Rate:  76 PR Interval:  166 QRS Duration: 102 QT Interval:  421 QTC Calculation: 473 R Axis:   74 Text Interpretation:  Sinus rhythm Consider left atrial enlargement No significant change since last tracing Confirmed by YAO  MD, DAVID [phone]) on 04/25/2013 4:58:55 PM            MDM   1. Chest pain     Afebrile vital signs stable on  arrival. Patient with chest pain since last night. Patient with multiple risk factors including known CAD, hypertension, hyperlipidemia, smoking and strong family history. Initial EKG findings as above. However no overt signs of ischemia. Initial troponin is negative. CBC and BMP unremarkable. Chest x-ray within normal limits. History not consistent with aortic pathology. Patient hypoxic, tachycardic. No clinical or history consistent with pulmonary embolism. Patient does have COPD however has no wheezing, hypoxia or other signs of bleeding to believe this would be a COPD exacerbation. Secondary patient's multiple risk factors will need to be admitted for continued cardiac workup. Patient did received ASA 325 with EMS. No chest pain on arrival the nitroglycerin given. Patient remained stable emergency department until time of transfer to floor.     Bridgett Larsson, MD 04/25/13 2209

## 2013-04-25 NOTE — ED Notes (Signed)
Report to [facility] on 3W.  Hospitalist into room to see pt at this time.  Will transport to floor when he is finished with pt.

## 2013-04-25 NOTE — ED Notes (Signed)
Assisted pt to RR to void; UA at bedside if needed.  Pt unsteady on feet, very sleepy.  Instructed pt to remain in bed, not to get up without assistance of a staff member.  Husband verbalized understanding of instructions as well.

## 2013-04-25 NOTE — ED Notes (Signed)
Pt reports CP unchanged, refuses further NTG SL.  Upon evaluation, pt chest pain reproduced with palpation.  Rash covers entire trunk and extends to legs.  Pt had husband itch back, now has visible scratches.  Husband told me he found peroxide in the cupboard and rubbed it all over her back.  Requested he allow staff to agree with his treatment modalities while in the ED; he agreed.

## 2013-04-25 NOTE — ED Notes (Signed)
Pt husband not in room.  Pt stated she was going to stay "but don't tell my husband."  Just then husband came into room with wheelchair telling her he would take her to see a cardiologist in [place] tomorrow.  I was replacing all the monitor hook ups both to pt and to monitor which pt husband had removed.  He was quite upset but left.  Apparently he made threatening gestures to the physician just prior to my contact with patient.

## 2013-04-25 NOTE — H&P (Signed)
[facility] History and Physical  Patient: [name] [name]  AVW:[id number]  DOB: [birth year]  DOS: the patient was seen and examined on 04/25/2013 PCP: [name], MD  Chief Complaint: Chest pain  HPI: [name] [name] is a 67 y.o. female with Past medical history of Hypertension, COPD, dyslipidemia, CVA, coronary artery disease, chronic pain syndrome on methadone. The patient is coming from home. The patient presents withComplains of chest pain that is located on the left side accident remaining she describes the pain as a sharp stabbing pain without any radiation located in the left lower chest and sternum. She also mentioned that the patient only she has a pressure-like sensation. She denies any worsening with breathing or cough and she denies any fever or chills. She denies any similar symptoms or association leg this pain in her prior coronary angina. She has been given nitroglycerin aspirin which has not helped her pain but she continues to have sharp pain located in the lower chest. The pain she mentions has been ongoing since afternoon and started at rest. She does complains of some GERD. She denies any orthopnea or PND. She has some dyspnea on exertion at her baseline which is about the same.  Review of Systems: as mentioned in the history of present illness.  A Comprehensive review of the other systems is negative.  Past Medical History  Diagnosis Date  . Arachnoiditis 1970    due to her Pantopaque myelograms in the 1970s, evidence of this on CT scan 11/2002  . Hypertension   . MVP (mitral valve prolapse)     h/o scarlet fever  . COPD (chronic obstructive pulmonary disease) 2003    by CXR, chronic bronchitis  . HLD (hyperlipidemia)   . Colon polyps   . H/O: CVA (cerebrovascular accident)     TIA  . Urine incontinence   . Anxiety     severe with panic attacks  . CAD (coronary artery disease) 2002    catheterization: 60% RCA stenosis, LAD 30%, L circ 25%  . Hydradenitis  2001    chronic, perineal s/p surgeries ([facility]) ?sweet's syndrome  . Psychogenic syncope 2003  . Orthostatic hypotension 2002  . DDD (degenerative disc disease), lumbar 2003    mult HNP with radiculopathy s/p mult surgeries and fusion  . Chronic pain syndrome     high dose methadone since hospitalization 05/16/2002, has not tolerated lyrica, cymbalta, zanaflex  . Chronic back pain     failed ESI and sympathetic blocks  . Osteoarthritis of both knees     severe  . Aspiration pneumonia 02/2012    after overdose  . 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 Adenomatous polyp 04/2008    rec rpt colon 3 yrs ([facility])  . 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 Foot fracture, right 2012    ?nonunion  . 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 Smoker 1969   Past Surgical History  Procedure Laterality Date  . Laminectomy  1976,1977,1999,2003    x 4  . Abdominal hysterectomy  1985  . Appendectomy  1963  . Breast biopsy  1966  . Cardiac catheterization  2004  . Lumbar fusion  2003    L4/5 laminotomy with posterior lumbar interbody fusion ([facility])  . Knee arthroscopy Left     Erik Obey  . Colonoscopy  04/2008  tubular adenoma, int hem, rec rpt 3 yrs - Dr. Bosie Clos with [facility]   Social History:  reports that she has been smoking Cigarettes.  She started smoking about 45 years ago. She has a 11.25 pack-year smoking history. She has never used smokeless tobacco. She reports that she does not drink alcohol or use illicit drugs. Independent for most of her  ADL.  Allergies  Allergen Reactions  . Penicillins Anaphylaxis  . Sulfa Antibiotics Anaphylaxis  . Sertraline Hcl Other (See Comments)    Caused pt to be hyper and confused  . Beta Adrenergic Blockers     Bradycardia, orthostatic hypotension  . Mushroom Extract  Complex     unknwon  . Other     ALL ANTI DEPRESSANTS; MUSHROOMS  . Toradol [Ketorolac Tromethamine] Nausea And Vomiting  . Tylenol [Acetaminophen] Nausea And Vomiting  . Versed [Midazolam]     DID NOT WORK  . Codeine Itching and Rash    Family History  Problem Relation Age of Onset  . Arthritis    . CAD Mother     MI  . CAD Father     MI s/p 3v CABG  . Kidney failure Father     ESRD on HD  . Stroke Mother   . Cancer Neg Hx   . Diabetes Neg Hx   . Deep vein thrombosis Mother   . Deep vein thrombosis Father   . Deep vein thrombosis Paternal Grandfather     Prior to Admission medications   Medication Sig Start Date End Date Taking? Authorizing Provider  albuterol (PROVENTIL HFA;VENTOLIN HFA) 108 (90 BASE) MCG/ACT inhaler Inhale 2 puffs into the lungs every 6 (six) hours as needed for wheezing. 05/31/12  Yes Clanford Cyndie Mull, MD  ALPRAZolam Prudy Feeler) 1 MG tablet TAKE ONE TABLET FOUR TIMES DAILY AS NEEDED FOR ANXIETY 04/06/13  Yes [name], MD  aspirin 81 MG chewable tablet Chew 1 tablet (81 mg total) by mouth daily. 03/13/12  Yes Russella Dar, NP  Calcium Carb-Cholecalciferol (CALCIUM-VITAMIN D) 600-400 MG-UNIT TABS Take 1 tablet by mouth 2 (two) times daily.   Yes Historical Provider, MD  furosemide (LASIX) 20 MG tablet Take 1 tablet (20 mg total) by mouth daily as needed (swelling). 01/04/13  Yes [name], MD  gabapentin (NEURONTIN) 300 MG capsule Take by mouth See admin instructions. 600 mg at lunch and 900 mg at bedtime   Yes Historical Provider, MD  methadone (DOLOPHINE) 10 MG tablet Take 30-40 mg by mouth every 8 (eight) hours as needed for moderate pain. 04/18/13  Yes [name], MD  Multiple Vitamin (MULTIVITAMIN WITH MINERALS) TABS Take 1 tablet by mouth daily.   Yes Historical Provider, MD    Physical Exam: Filed Vitals:   04/25/13 2130 04/25/13 2200 04/25/13 2230 04/25/13 2328  BP: 99/55 102/52 110/43 151/72  Pulse: 58 68 50 64  Temp:    97.6  F (36.4 C)  TempSrc:    Oral  Resp: 16 15 13    Height:    5\' 3"  (1.6 m)  Weight:    59.3 kg (130 lb 11.7 oz)  SpO2: 97% 96% 98% 100%    General: Alert, Awake and Oriented to Time, Place and Person. Appear in mild distress Eyes: PERRL ENT: Oral Mucosa clear moist. Neck: no JVD Cardiovascular: S1 and S2 Present, no Murmur, Peripheral Pulses Present Respiratory: Bilateral Air entry equal and Decreased, Clear to Auscultation,  no Crackles,no wheezes Abdomen: Bowel Sound Present, Soft and Non tender Skin: no  Rash Extremities: no Pedal edema, no calf tenderness Neurologic: Grossly Unremarkable.  Labs on Admission:  CBC:  Recent Labs Lab 04/25/13 1730  WBC 11.0*  NEUTROABS 9.2*  HGB 14.4  HCT 43.1  MCV 92.9  PLT 177    CMP     Component Value Date/Time   NA 137 04/25/2013 1730   K 3.4* 04/25/2013 1730   CL 99 04/25/2013 1730   CO2 25 04/25/2013 1730   GLUCOSE 138* 04/25/2013 1730   BUN 28* 04/25/2013 1730   CREATININE 1.62* 04/25/2013 1730   CALCIUM 10.5 04/25/2013 1730   PROT 7.5 01/17/2013 1620   ALBUMIN 3.2* 03/12/2013 1714   AST 23 01/17/2013 1620   ALT 17 01/17/2013 1620   ALKPHOS 184* 01/17/2013 1620   BILITOT 0.3 01/17/2013 1620   GFRNONAA 32* 04/25/2013 1730   GFRAA 37* 04/25/2013 1730    No results found for this basename: LIPASE, AMYLASE,  in the last 168 hours No results found for this basename: AMMONIA,  in the last 168 hours  No results found for this basename: CKTOTAL, CKMB, CKMBINDEX, TROPONINI,  in the last 168 hours BNP (last 3 results)  Recent Labs  12/28/12 1456  PROBNP 138.0*    Radiological Exams on Admission: Dg Chest 2 View  04/25/2013   CLINICAL DATA:  Chest pain, smoker  EXAM: CHEST  2 VIEW  COMPARISON:  07/29/2012  FINDINGS: Cardiomediastinal silhouette is stable. Again noted hyperinflation and chronic interstitial prominence. Stable infrahilar bronchitic changes. Thoracic spine osteopenia. No definite superimposed infiltrate or pulmonary  edema.  IMPRESSION: Again noted hyperinflation and chronic interstitial prominence. Stable infrahilar bronchitic changes. Thoracic spine osteopenia. No definite superimposed infiltrate or pulmonary edema.   Electronically Signed   By: Natasha Mead M.D.   On: 04/25/2013 18:17    EKG: Independently reviewed. normal sinus rhythm, nonspecific ST and T waves changes.  Assessment/Plan Principal Problem:   Chest pain Active Problems:   Chronic back pain   H/O: CVA (cerebrovascular accident)   CAD (coronary artery disease)   1. Chest pain The patient is presenting with chest pain without any EKG changes and normal troponin levels. Considering her history of CAD and CVA she will be admitted to the hospital for further workup including serial troponins and a possible echocardiogram. She would also be monitored on telemetry. I will give her as needed nitroglycerin and pain medications for pain control. Continue her on a baby aspirin  2.Chronic back pain the patient initially told me that she has been taking 5 tablets of 10 mg methadone 4 times a day. I did inform her that since last 3 months at least her PCP has been providing her prescriptions of 30-40 mg of methadone every 8 hours as needed only and mention that I would only be able to provide the same dose in the hospital which she agreed upon. She'll be close to monitor for any possible withdrawal symptoms. Continue Neurontin as well   DVT Prophylaxis: subcutaneous Heparin Nutrition: Cardiac diet  Code Status: Full  Disposition: Admitted to observationin telemetry unit.  Author: Lynden Oxford, MD [facility] Pager: [phone] 04/25/2013, 11:40 PM    If 7PM-7AM, please contact night-coverage [email or web address] Password [email or web address]

## 2013-04-25 NOTE — ED Notes (Signed)
Pt arrived from home by [facility] with c/o CP that started last night that is stabbing and constant. Denies any n/v or SOB with CP. EMS administered ASA 324mg . 12 lead unremarkable. BP-116/84 HR-72 O2sat-99% RA.

## 2013-04-25 NOTE — ED Notes (Signed)
Pt refuses another nitro d/t headache.

## 2013-04-26 ENCOUNTER — Encounter (HOSPITAL_COMMUNITY): Payer: Self-pay | Admitting: General Practice

## 2013-04-26 ENCOUNTER — Telehealth: Payer: Self-pay | Admitting: Family Medicine

## 2013-04-26 ENCOUNTER — Ambulatory Visit: Payer: Medicare Other | Admitting: Family Medicine

## 2013-04-26 DIAGNOSIS — M549 Dorsalgia, unspecified: Secondary | ICD-10-CM

## 2013-04-26 DIAGNOSIS — R55 Syncope and collapse: Secondary | ICD-10-CM

## 2013-04-26 DIAGNOSIS — R21 Rash and other nonspecific skin eruption: Secondary | ICD-10-CM

## 2013-04-26 DIAGNOSIS — G8929 Other chronic pain: Secondary | ICD-10-CM

## 2013-04-26 LAB — COMPREHENSIVE METABOLIC PANEL
ALT: 23 U/L (ref 0–35)
AST: 33 U/L (ref 0–37)
Albumin: 3 g/dL — ABNORMAL LOW (ref 3.5–5.2)
Alkaline Phosphatase: 152 U/L — ABNORMAL HIGH (ref 39–117)
Creatinine, Ser: 1.48 mg/dL — ABNORMAL HIGH (ref 0.50–1.10)
GFR calc Af Amer: 41 mL/min — ABNORMAL LOW (ref >90)
GFR calc non Af Amer: 36 mL/min — ABNORMAL LOW (ref >90)
Glucose, Bld: 104 mg/dL — ABNORMAL HIGH (ref 70–99)
Potassium: 3.2 mEq/L — ABNORMAL LOW (ref 3.5–5.1)
Sodium: 138 mEq/L (ref 135–145)
Total Protein: 6.4 g/dL (ref 6.0–8.3)

## 2013-04-26 LAB — CBC WITH DIFFERENTIAL/PLATELET
Basophils Absolute: 0 10*3/uL (ref 0.0–0.1)
Eosinophils Absolute: 0.2 10*3/uL (ref 0.0–0.7)
Eosinophils Relative: 3 % (ref 0–5)
Hemoglobin: 12 g/dL (ref 12.0–15.0)
Lymphs Abs: 2.3 10*3/uL (ref 0.7–4.0)
MCH: 30.3 pg (ref 26.0–34.0)
MCV: 91.9 fL (ref 78.0–100.0)
Monocytes Absolute: 0.6 10*3/uL (ref 0.1–1.0)
Neutrophils Relative %: 56 % (ref 43–77)
Platelets: 189 10*3/uL (ref 150–400)
RBC: 3.96 MIL/uL (ref 3.87–5.11)
RDW: 13.8 % (ref 11.5–15.5)
WBC: 7.2 10*3/uL (ref 4.0–10.5)

## 2013-04-26 LAB — TROPONIN I
Troponin I: <0.3 ng/mL (ref <0.30)
Troponin I: <0.3 ng/mL (ref <0.30)

## 2013-04-26 MED ORDER — METHADONE HCL 10 MG PO TABS
30.0000 mg | ORAL_TABLET | Freq: Three times a day (TID) | ORAL | Status: DC
  Administered 2013-04-26 – 2013-04-27 (×2): 30 mg via ORAL
  Filled 2013-04-26 (×3): qty 3

## 2013-04-26 MED ORDER — DIPHENHYDRAMINE HCL 25 MG PO CAPS
25.0000 mg | ORAL_CAPSULE | Freq: Four times a day (QID) | ORAL | Status: DC | PRN
  Administered 2013-04-26: 25 mg via ORAL
  Filled 2013-04-26: qty 1

## 2013-04-26 MED ORDER — METHADONE HCL 10 MG PO TABS
10.0000 mg | ORAL_TABLET | Freq: Three times a day (TID) | ORAL | Status: DC | PRN
  Administered 2013-04-26: 10 mg via ORAL
  Filled 2013-04-26: qty 1

## 2013-04-26 NOTE — Progress Notes (Addendum)
[facility] PROGRESS NOTE    [name] [name]:[id number] DOB: Apr 02, 1946 DOA: 04/25/2013 PCP: [name], MD  HPI/Brief narrative 67 year old female with history of hyperlipidemia, CVA/TIA, anxiety, CAD, chronic pain, COPD and hypertension presented to the ED with complaints of chest pain and an episode of syncope (did not mention to admitting or ED physician). Patient states pain left side of the chest underneath the breast, sharp, 7-8/10 in intensity, nonradiating, worse with deep breathing/coughing and even touching her chest wall. Nonproductive cough. No fever or chills. She stated that she was sitting in front of the dressing table and passed out for 4-5 minutes that was witnessed by her cousin. In the ED, EKG without acute findings, troponin and chest x-ray negative. Admitted for further evaluation and management.  Assessment/Plan:  Atypical chest pain - Historically and clinically seems musculoskeletal in etiology. - EKG without acute changes and troponin x3 negative - Hesitant to use NSAIDs secondary to renal insufficiency. - Continue methadone and K pad for pain management and monitor  Syncope - Seems orthostatic in nature  - Continue monitoring on telemetry. Check orthostatic blood pressures  - Check 2-D echo and carotid Dopplers. No focal deficits on exam.   Skin rash - Seems fungal in nature. Try nystatin powder and monitor. - When necessary Benadryl for itching.  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 - Continue gabapentin  COPD  - Stable   Hypokalemia - Replete and follow BMP  Stage III chronic kidney disease - Creatinine seems to be at baseline. - Recent episode of acute renal failure in October-  resolved  Hypertension - Soft blood pressures. Monitor  ? Hypothermia - ? Error checking temperature. Normothermic now. Monitor closely.   DVT prophylaxis:  heparin  Code Status: Full Family Communication: None Disposition Plan: Home when medically stable   Consultants:  None   Procedures:  None   Antibiotics:  None    Subjective:  Continued left sided chest pain. Itchy skin rash.   Objective: Filed Vitals:   04/26/13 0700 04/26/13 1029 04/26/13 1033 04/26/13 1035  BP:  105/57 93/56 99/60   Pulse:  58 60 65  Temp: 97.4 F (36.3 C)   97.6 F (36.4 C)  TempSrc: Oral   Oral  Resp:      Height:      Weight:      SpO2:  99% 98% 100%    Intake/Output Summary (Last 24 hours) at 04/26/13 1334 Last data filed at 04/26/13 0900  Gross per 24 hour  Intake    200 ml  Output      0 ml  Net    200 ml   Filed Weights   04/25/13 2328  Weight: 59.3 kg (130 lb 11.7 oz)     Exam:  Patient was examined with her female nurse as chaperone in the room. General exam:  moderately built and nourished female patient, looking older than stated age, sitting up in bed, mildly anxious and in mild painful distress. Respiratory system: Clear. No increased work of breathing. Definitely reproducible left lower anterior chest wall tenderness.  Cardiovascular system: S1 & S2 heard, RRR. No JVD, murmurs, gallops, clicks or pedal edema. Telemetry: Sinus bradycardia in the 40s-50s.  Gastrointestinal system: Abdomen is nondistended, soft and nontender.  Normal bowel sounds heard. Central nervous system: Alert and oriented. No focal neurological deficits. Extremities: Symmetric 5 x 5 power. Skin: Patchy, erythematous, faint, pruritic rash right anterior trunk, underneath bilateral breasts, bilateral groin and some on the back. Scratch marks on the back.    Data Reviewed: Basic Metabolic Panel:  Recent Labs Lab 04/25/13 1730 04/26/13 0526  NA 137 138  K 3.4* 3.2*  CL 99 102  CO2 25  24  GLUCOSE 138* 104*  BUN 28* 26*  CREATININE 1.62* 1.48*  CALCIUM 10.5 9.9   Liver Function Tests:  Recent Labs Lab 04/26/13 0526  AST 33  ALT 23  ALKPHOS 152*  BILITOT 0.2*  PROT 6.4  ALBUMIN 3.0*   No results found for this basename: LIPASE, AMYLASE,  in the last 168 hours No results found for this basename: AMMONIA,  in the last 168 hours CBC:  Recent Labs Lab 04/25/13 1730 04/26/13 0526  WBC 11.0* 7.2  NEUTROABS 9.2* 4.0  HGB 14.4 12.0  HCT 43.1 36.4  MCV 92.9 91.9  PLT 177 189   Cardiac Enzymes:  Recent Labs Lab 04/25/13 0030 04/26/13 0526 04/26/13 1145  TROPONINI <0.30 <0.30 <0.30   BNP (last 3 results)  Recent Labs  12/28/12 1456  PROBNP 138.0*   CBG: No results found for this basename: GLUCAP,  in the last 168 hours  No results found for this or any previous visit (from the past 240 hour(s)).    Additional labs: 1. None      Studies: Dg Chest 2 View  04/25/2013   CLINICAL DATA:  Chest pain, smoker  EXAM: CHEST  2 VIEW  COMPARISON:  07/29/2012  FINDINGS: Cardiomediastinal silhouette is stable. Again noted hyperinflation and chronic interstitial prominence. Stable infrahilar bronchitic changes. Thoracic spine osteopenia. No definite superimposed infiltrate or pulmonary edema.  IMPRESSION: Again noted hyperinflation and chronic interstitial prominence. Stable infrahilar bronchitic changes. Thoracic spine osteopenia. No definite superimposed infiltrate or pulmonary edema.   Electronically Signed   By: Natasha Mead M.D.   On: 04/25/2013 18:17        Scheduled Meds: . aspirin  81 mg Oral Daily  . gabapentin  600 mg Oral Daily  . gabapentin  900 mg Oral QHS  . gabapentin  600 mg Oral q morning - 10a  . heparin  5,000 Units Subcutaneous Q8H   Continuous Infusions:   Principal Problem:   Chest pain Active Problems:   Chronic back pain   H/O: CVA (cerebrovascular accident)   CAD (coronary artery disease)    Time spent: 45 minutes.  Time spent in evaluating patient, reviewing all lab and imaging data, discussing with patient in updating care and plan, discussing with patient's nursing.     Marcellus Scott, MD, FACP, FHM. [facility] Pager [phone]  If 7PM-7AM, please contact night-coverage [email or web address] Password [email or web address] 04/26/2013, 1:34 PM    LOS: 1 day

## 2013-04-26 NOTE — Evaluation (Signed)
Occupational Therapy Evaluation Patient Details Name: [name] [name] MRN: [id number] DOB: 29-Dec-1945 Today's Date: 04/26/2013 Time: 1204-1228 OT Time Calculation (min): 24 min  OT Assessment / Plan / Recommendation History of present illness 67 y.o. female admitted from home with c/o chest pain on left side. Pt with hx of HTN . COPD, CVA, dyslipidemia, CAD and chronic pain syndrome on methadone.   Clinical Impression   PT admitted with Chest pain left side. Pt currently with functional limitiations due to the deficits listed below (see OT problem list).  Pt will benefit from skilled OT to increase their independence and safety with adls and balance to allow discharge SNF. Question ability to complete ADLs and personal hygiene due to rash present. Pt with balance deficits and unable to ambulate without (A) at this time.     OT Assessment  Patient needs continued OT Services    Follow Up Recommendations  SNF    Barriers to Discharge      Equipment Recommendations  None recommended by OT    Recommendations for Other Services    Frequency  Min 2X/week    Precautions / Restrictions Precautions Precautions: Fall   Pertinent Vitals/Pain Holding left rib cage but reports pain is at sternum Pt reports "sharp" Pt unable to rate pain at this time    ADL  Eating/Feeding: Minimal assistance Where Assessed - Eating/Feeding: Bed level Grooming: Wash/dry hands;Minimal assistance Where Assessed - Grooming: Supported standing Lower Body Dressing: Moderate assistance Where Assessed - Lower Body Dressing: Supported sit to Pharmacist, hospital: Moderate assistance Toilet Transfer Method: Sit to stand Toilet Transfer Equipment: Regular height toilet;Grab bars Toileting - Clothing Manipulation and Hygiene: Moderate assistance Where Assessed - Toileting Clothing Manipulation and Hygiene: Sit to stand from 3-in-1 or toilet Equipment Used: Gait belt;Other (comment) (hand held  (A)) Transfers/Ambulation Related to ADLs: Pt ambulating with hand held (A) with full body tremors and jerking motion. pt unsteady and required MOD (A) ADL Comments: Pt agreeable to bathroom transfer. Pt reports feeling that staff were annoyed with helping her this AM. pt reassured that our staff are here to help. Pt then reports the reason for feeling this way was due to inability to walk to bathroom alone. Pt completed transfer this session and educated by OT that she is not safe to transfer without help. Pt states "WHATEVER" Pt holding onto unsafe environmental supports and heavily relies on therapist for support to transfer. Pt demosntrates lack of awareness to gait deficits. Pt with rash on abdomen bil legs and peri area. RN called to room and providing fungi powder. Pt and Ot applying to skin. Pt returning to supine position and eating apple sauce. pt closing eyes during session adn demonstrates focused attention. Question cognitive changes v/s need for psych consult. pt with scratches on back and reports she asked her husband to scratch her back with a hair brush. pt states "he did exactly what I asked him to do and he did it right even if it looks bad he did it correct." Pt seems grateful for scratch marks being present on her skin.    OT Diagnosis: Generalized weakness;Acute pain  OT Problem List: Decreased strength;Decreased activity tolerance;Impaired balance (sitting and/or standing);Decreased safety awareness;Decreased knowledge of use of DME or AE;Decreased knowledge of precautions;Pain OT Treatment Interventions: Self-care/ADL training;Therapeutic exercise;DME and/or AE instruction;Cognitive remediation/compensation;Therapeutic activities;Patient/family education;Balance training   OT Goals(Current goals can be found in the care plan section) Acute Rehab OT Goals Patient Stated Goal: to go to the bathroom  alone OT Goal Formulation: With patient Time For Goal Achievement:  05/10/13 Potential to Achieve Goals: Good  Visit Information  Last OT Received On: 04/26/13 Assistance Needed: +1 History of Present Illness: 67 y.o. female admitted from home with c/o chest pain on left side. Pt with hx of HTN . COPD, CVA, dyslipidemia, CAD and chronic pain syndrome on methadone.       Prior Functioning     Home Living Family/patient expects to be discharged to:: Private residence Living Arrangements: Spouse/significant other Available Help at Discharge: Family Type of Home: Mobile home Home Access: Stairs to enter Entrance Stairs-Number of Steps: 2-3 Entrance Stairs-Rails: Left;Right;Can reach both Home Layout: One level Home Equipment: Gilmer Mor - quad;Walker - 2 wheels;Wheelchair - manual;Bedside commode Additional Comments: spouse must take care of 40 yo mother Prior Function Level of Independence: Independent with assistive device(s) Communication Communication: No difficulties Dominant Hand: Right         Vision/Perception Vision - History Baseline Vision: Wears glasses only for reading Patient Visual Report: No change from baseline   Cognition  Cognition Arousal/Alertness: Awake/alert Behavior During Therapy: Flat affect Overall Cognitive Status: Impaired/Different from baseline Area of Impairment: Safety/judgement;Awareness;Problem solving Memory: Decreased short-term memory Safety/Judgement: Decreased awareness of deficits Awareness: Anticipatory Problem Solving: Slow processing General Comments: Pt discussing topics unrelated to OT session such as father being on HD and passing after 5 years of having HD. Pt with prolonged time in restroom. pt when asked states "believe it or not I was praying and forgot what I was doing. Glad you said something"    Extremity/Trunk Assessment Upper Extremity Assessment Upper Extremity Assessment: Overall WFL for tasks assessed Lower Extremity Assessment Lower Extremity Assessment: Defer to PT evaluation  (reports need for BIL knee replacement) Cervical / Trunk Assessment Cervical / Trunk Assessment: Kyphotic     Mobility Bed Mobility Bed Mobility: Supine to Sit;Sitting - Scoot to [place] of Bed;Sit to Supine Supine to Sit: 5: Supervision;HOB elevated Sitting - Scoot to Edge of Bed: 5: Supervision Sit to Supine: 5: Supervision;HOB elevated Transfers Transfers: Sit to Stand;Stand to Sit Sit to Stand: 4: Min assist;From elevated surface;From bed Stand to Sit: 4: Min assist;With upper extremity assist;To chair/3-in-1 Details for Transfer Assistance: needed hand held support and posterior lean     Exercise     Balance Balance Balance Assessed: Yes Static Standing Balance Static Standing - Balance Support: Bilateral upper extremity supported;During functional activity Static Standing - Level of Assistance: 4: Min assist   End of Session OT - End of Session Activity Tolerance: Patient tolerated treatment well Patient left: in bed;with call bell/phone within reach;with bed alarm set Nurse Communication: Mobility status;Precautions  GO     Harolyn Rutherford 04/26/2013, 2:17 PM Pager: [phone]

## 2013-04-26 NOTE — Progress Notes (Signed)
Pt. Had an oral & axillary temp of 94.2, using 2 different devices.  MD notified.  Told to try to warm pt with warm blankets and recheck temp in 1 hour.  Pt. Placed with several warm blankets around/on her and room thermostat turned up.  Will continue to monitor pt and recheck temp in 1 hour.

## 2013-04-26 NOTE — Progress Notes (Signed)
Echocardiogram 2D Echocardiogram has been performed.  [name] [name] 04/26/2013, 3:50 PM

## 2013-04-26 NOTE — ED Provider Notes (Signed)
I saw and evaluated the patient, reviewed the resident's note and I agree with the findings and plan.  EKG Interpretation    Date/Time:  Wednesday April 25 2013 16:50:52 EST Ventricular Rate:  76 PR Interval:  166 QRS Duration: 102 QT Interval:  421 QTC Calculation: 473 R Axis:   74 Text Interpretation:  Sinus rhythm Consider left atrial enlargement No significant change since last tracing Confirmed by Caileigh Canche  MD, Sireen Halk [phone]) on 04/25/2013 4:58:55 PM            [name] [name] is a 67 y.o. female hx of COPD, HL, significant CAD but no stents, HTN here with chest pain. Patient poor historian. Has L sided chest pressure since yesterday. Trop neg x 1. Doubt PE. Given her risk factors, will admit for r/o ACS. She received ASA 325 mg by EMS.     Richardean Canal, MD 04/26/13 [phone]

## 2013-04-26 NOTE — Progress Notes (Signed)
UR completed 

## 2013-04-26 NOTE — Telephone Encounter (Signed)
[name] [name], patient's spouse, called to cancel today's 4:30 pm appt 04/26/13.  [name] [name] was admitted to [facility] last night 3W28 with chest discomfort.  Best number to call patient, if needed is [phone] or Nephew's phone of [phone]

## 2013-04-27 DIAGNOSIS — N179 Acute kidney failure, unspecified: Secondary | ICD-10-CM

## 2013-04-27 DIAGNOSIS — R55 Syncope and collapse: Secondary | ICD-10-CM

## 2013-04-27 MED ORDER — NITROGLYCERIN 0.4 MG SL SUBL: 0.4000 mg | SUBLINGUAL_TABLET | SUBLINGUAL | Status: AC | PRN

## 2013-04-27 NOTE — Progress Notes (Signed)
Bilateral carotid artery duplex:  1-39% ICA stenosis.  Vertebral artery flow is antegrade.     

## 2013-04-27 NOTE — Discharge Summary (Signed)
Physician Discharge Summary  Patient ID: [name] [name] MRN: [id number] DOB/AGE: Nov 07, 1945 67 y.o.  Admit date: 04/25/2013 Discharge date: 04/27/2013  Primary Care Physician:  [name], MD  Discharge Diagnoses:    . atypical chest pain resolved . Chronic back pain/chronic pain syndrome on methadone  . CAD (coronary artery disease) . Syncope . Skin rash  Consults: None   Allergies:   Allergies  Allergen Reactions  . Penicillins Anaphylaxis  . Sulfa Antibiotics Anaphylaxis  . Sertraline Hcl Other (See Comments)    Caused pt to be hyper and confused  . Beta Adrenergic Blockers     Bradycardia, orthostatic hypotension  . Mushroom Extract Complex     unknwon  . Other     ALL ANTI DEPRESSANTS; MUSHROOMS  . Toradol [Ketorolac Tromethamine] Nausea And Vomiting  . Tylenol [Acetaminophen] Nausea And Vomiting  . Versed [Midazolam]     DID NOT WORK  . Codeine Itching and Rash     Discharge Medications:   Medication List         albuterol 108 (90 BASE) MCG/ACT inhaler  Commonly known as:  PROVENTIL HFA;VENTOLIN HFA  Inhale 2 puffs into the lungs every 6 (six) hours as needed for wheezing.     ALPRAZolam 1 MG tablet  Commonly known as:  XANAX  TAKE ONE TABLET FOUR TIMES DAILY AS NEEDED FOR ANXIETY     aspirin 81 MG chewable tablet  Chew 1 tablet (81 mg total) by mouth daily.     Calcium-Vitamin D 600-400 MG-UNIT Tabs  Take 1 tablet by mouth 2 (two) times daily.     furosemide 20 MG tablet  Commonly known as:  LASIX  Take 1 tablet (20 mg total) by mouth daily as needed (swelling).     gabapentin 300 MG capsule  Commonly known as:  NEURONTIN  Take by mouth See admin instructions. 600 mg at lunch and 900 mg at bedtime     methadone 10 MG tablet  Commonly known as:  DOLOPHINE  Take 30-40 mg by mouth every 8 (eight) hours. Pt is taking it schedule at home. Dr Waymon Amato did confirm with outpt MD     multivitamin with minerals Tabs tablet  Take 1 tablet by  mouth daily.     nitroGLYCERIN 0.4 MG SL tablet  Commonly known as:  NITROSTAT  Place 1 tablet (0.4 mg total) under the tongue every 5 (five) minutes as needed for chest pain.         Brief H and P: For complete details please refer to admission H and P, but in brief patient is a 67-year-old female with past medical history hypertension, COPD, dyslipidemia, chronic pain syndrome on methadone, presented with complaints of chest pain. Patient reported that it is located on the left side, sharp stabbing without any radiation. She also mentioned that she had pressure-like sensation with no fevers or chills, shortness of breath or any coughing.  Hospital Course:  Atypical chest pain resolved, patient is currently at her baseline. Clinically seems to be musculoskeletal. EKG shows no acute changes suggestive of any ischemia. Troponins x3 were negative  Patient was continued on methadone. 2-D echo was done which showed EF of 55-60%, grade 1 diastolic dysfunction, no regional wall motion abnormalities  Syncope  - Seems orthostatic in nature, patient was monitored on telemetry, orthostatic blood pressures were negative, carotid Dopplers showed 1-39% bilateral ICA stenosis. Resolved  Skin rash  - Seems fungal in nature.  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  Hongalgi discussed with patient's PCP who reviewed her chart and advised that she was on methadone 30-40 mg by mouth every 8 hours scheduled and recommended adding methadone 10 mg every 8 hourly when necessary for breakthrough pain and not do any other opioids. Patient was continued on Neurontin. She will follow up with her PCP.  COPD  - Stable   Stage III chronic kidney disease  - Creatinine seems to be at baseline.  - Recent episode of acute renal failure in October- resolved   Hypertension  - Soft blood pressures. Monitor   ? Hypothermia - ? Error checking temperature. Normothermic now. Patient is currently back to her baseline. PT  evaluation was done and recommended supervision possible skilled nursing facility or home physical therapy. I discussed in detail with the patient and her husband, they declined skilled nursing facility or any other resources. Patient's husband reported that this is her baseline mental and physical status and they're fine. They wanted to be discharged ASAP.    Day of Discharge BP 110/68  Pulse 65  Temp(Src) 97.1 F (36.2 C) (Oral)  Resp 18  Ht 5\' 3"  (1.6 m)  Wt 59.3 kg (130 lb 11.7 oz)  BMI 23.16 kg/m2  SpO2 93%  Physical Exam: General: Alert and awake oriented x3 not in any acute distress. CVS: S1-S2 clear no murmur rubs or gallops Chest: clear to auscultation bilaterally, no wheezing rales or rhonchi Abdomen: soft nontender, nondistended, normal bowel sounds Extremities: no cyanosis, clubbing or edema noted bilaterally Neuro: Cranial nerves II-XII intact, no focal neurological deficits   The results of significant diagnostics from this hospitalization (including imaging, microbiology, ancillary and laboratory) are listed below for reference.    LAB RESULTS: Basic Metabolic Panel:  Recent Labs Lab 04/25/13 1730 04/26/13 0526  NA 137 138  K 3.4* 3.2*  CL 99 102  CO2 25 24  GLUCOSE 138* 104*  BUN 28* 26*  CREATININE 1.62* 1.48*  CALCIUM 10.5 9.9   Liver Function Tests:  Recent Labs Lab 04/26/13 0526  AST 33  ALT 23  ALKPHOS 152*  BILITOT 0.2*  PROT 6.4  ALBUMIN 3.0*   No results found for this basename: LIPASE, AMYLASE,  in the last 168 hours No results found for this basename: AMMONIA,  in the last 168 hours CBC:  Recent Labs Lab 04/25/13 1730 04/26/13 0526  WBC 11.0* 7.2  NEUTROABS 9.2* 4.0  HGB 14.4 12.0  HCT 43.1 36.4  MCV 92.9 91.9  PLT 177 189   Cardiac Enzymes:  Recent Labs Lab 04/26/13 0526 04/26/13 1145  TROPONINI <0.30 <0.30   BNP: No components found with this basename: POCBNP,  CBG: No results found for this basename: GLUCAP,   in the last 168 hours  Significant Diagnostic Studies:  Dg Chest 2 View  04/25/2013   CLINICAL DATA:  Chest pain, smoker  EXAM: CHEST  2 VIEW  COMPARISON:  07/29/2012  FINDINGS: Cardiomediastinal silhouette is stable. Again noted hyperinflation and chronic interstitial prominence. Stable infrahilar bronchitic changes. Thoracic spine osteopenia. No definite superimposed infiltrate or pulmonary edema.  IMPRESSION: Again noted hyperinflation and chronic interstitial prominence. Stable infrahilar bronchitic changes. Thoracic spine osteopenia. No definite superimposed infiltrate or pulmonary edema.   Electronically Signed   By: Natasha Mead M.D.   On: 04/25/2013 18:17    2D ECHO: Study Conclusions  - Left ventricle: The cavity size was normal. Wall thickness was normal. Systolic function was normal. The estimated ejection fraction was in the range of 55% to  60%. Wall motion was normal; there were no regional wall motion abnormalities. Doppler parameters are consistent with abnormal left ventricular relaxation (grade 1 diastolic dysfunction). - Atrial septum: No defect or patent foramen ovale was identified.    Disposition and Follow-up:     Discharge Orders   Future Appointments Provider Department Dept Phone   06/29/2013 4:15 PM [name], MD [facility] [phone]   Future Orders Complete By Expires   Diet Carb Modified  As directed    Increase activity slowly  As directed        DISPOSITION: Home    DISCHARGE FOLLOW-UP Follow-up Information   Follow up with [name], MD. Schedule an appointment as soon as possible for a visit in 2 weeks. (for hospital follow-up)    Specialty:  Family Medicine   Contact information:   [address] [place] [address] [phone]       Time spent on Discharge: 35 minutes  Signed:   RAI,RIPUDEEP M.D. [facility] 04/27/2013, 2:13 PM Pager: [phone]

## 2013-04-27 NOTE — Evaluation (Signed)
Physical Therapy Evaluation Patient Details Name: [name] [name] MRN: [id number] DOB: [birth year] Today's Date: 04/27/2013 Time: 4696-2952 PT Time Calculation (min): 24 min  PT Assessment / Plan / Recommendation History of Present Illness  67 y.o. female admitted from home with c/o chest pain on left side. Pt with hx of HTN . COPD, CVA, dyslipidemia, CAD and chronic pain syndrome on methadone.  Clinical Impression  Pt admitted with chest pain. Pt has history of falls (most recent one week ago when fell off step stool in kitchen). Pt aware of her deficits, yet very independent-minded and wants to do for herself. (Her husband was in next room when she fell. He was "busy" per pt and so she got up on step stool. Pt currently with functional limitations due to the deficits listed below (see PT Problem List). Pt will benefit from skilled PT to increase their independence and safety with mobility to allow discharge to the venue listed below. Pt adamantly refusing to go to SNF for therapies. Is agreeable to HHPT.      PT Assessment  Patient needs continued PT services    Follow Up Recommendations  Home health PT;Supervision for mobility/OOB    Does the patient have the potential to tolerate intense rehabilitation      Barriers to Discharge        Equipment Recommendations  None recommended by PT    Recommendations for Other Services     Frequency Min 3X/week    Precautions / Restrictions Precautions Precautions: Fall Precaution Comments: Pt reports she fell last week from step stool in kitchen (trying to reach a can of soup). Husband was at home but busy so she decided to do it herself.     Pertinent Vitals/Pain SaO2 92% on RA Chronic pain in bil legs; RLE worst      Mobility  Bed Mobility Bed Mobility: Not assessed Transfers Transfers: Sit to Stand;Stand to Sit Sit to Stand: 5: Supervision;With upper extremity assist;With armrests Stand to Sit: 5: Supervision;With upper extremity  assist;With armrests Details for Transfer Assistance: demonstrated proper use of RW; supervision for safety Ambulation/Gait Ambulation/Gait Assistance: 4: Min guard Ambulation Distance (Feet): 70 Feet Assistive device: Rolling walker Ambulation/Gait Assistance Details: pt required cues to keep RW closer to her, especially as legs began to tremor/bounce. Pt denied need to sit when tremors began and continued to walk another 55 feet with incr UE support via RW.  Gait Pattern: Step-to pattern;Decreased stride length;Right flexed knee in stance;Left flexed knee in stance;Trunk flexed Stairs: No    Exercises     PT Diagnosis: Difficulty walking;Generalized weakness  PT Problem List: Decreased strength;Decreased balance;Decreased mobility;Decreased knowledge of use of DME;Decreased safety awareness;Pain PT Treatment Interventions: DME instruction;Gait training;Stair training;Functional mobility training;Therapeutic activities;Therapeutic exercise;Patient/family education     PT Goals(Current goals can be found in the care plan section) Acute Rehab PT Goals Patient Stated Goal: to go home today PT Goal Formulation: With patient Time For Goal Achievement: 05/04/13 Potential to Achieve Goals: Fair  Visit Information  Last PT Received On: 04/27/13 Assistance Needed: +1 History of Present Illness: 67 y.o. female admitted from home with c/o chest pain on left side. Pt with hx of HTN . COPD, CVA, dyslipidemia, CAD and chronic pain syndrome on methadone.       Prior Functioning  Home Living Family/patient expects to be discharged to:: Private residence Living Arrangements: Spouse/significant other Available Help at Discharge: Family;Available 24 hours/day Type of Home: Mobile home Home Access: Stairs to enter Entrance  Stairs-Number of Steps: 6 (back 6 with rails; front 3 steps no rails) Entrance Stairs-Rails: Left;Right;Can reach both Home Layout: One level Home Equipment: Cane -  quad;Walker - 2 wheels;Wheelchair - manual;Bedside commode Additional Comments: spouse cares for her, a cousin, and 60 yo mother (they all live together) Prior Function Level of Independence: Independent with assistive device(s) Comments: uses cane & furniture because cannot fit RW around furniture Communication Communication: No difficulties Dominant Hand: Right    Cognition  Cognition Arousal/Alertness: Awake/alert Behavior During Therapy: Flat affect Overall Cognitive Status: No family/caregiver present to determine baseline cognitive functioning Area of Impairment: Safety/judgement Safety/Judgement: Decreased awareness of safety General Comments: Pt on topic. Well aware of her deficits, yet chooses to try to do things herself that are unsafe for her.    Extremity/Trunk Assessment Upper Extremity Assessment Upper Extremity Assessment: Defer to OT evaluation Lower Extremity Assessment Lower Extremity Assessment: Generalized weakness (Pt reports neurological d/o causes leg tremors) Cervical / Trunk Assessment Cervical / Trunk Assessment: Kyphotic   Balance Balance Balance Assessed: Yes Static Sitting Balance Static Sitting - Balance Support: No upper extremity supported;Feet supported Static Sitting - Level of Assistance: 7: Independent Static Sitting - Comment/# of Minutes: edge of chair Static Standing Balance Static Standing - Balance Support: Bilateral upper extremity supported;During functional activity Static Standing - Level of Assistance: 5: Stand by assistance  End of Session PT - End of Session Equipment Utilized During Treatment: Gait belt Activity Tolerance: Patient tolerated treatment well Patient left: in chair;with call bell/phone within reach Nurse Communication: Mobility status;Other (comment) (pt adamantly refusing SNF)  GP Functional Assessment Tool Used: clinical judgement Functional Limitation: Mobility: Walking and moving around Mobility: Walking and  Moving Around Current Status [phone]): At least 20 percent but less than 40 percent impaired, limited or restricted Mobility: Walking and Moving Around Goal Status [phone]): At least 1 percent but less than 20 percent impaired, limited or restricted   Travone Georg 04/27/2013, 12:58 PM Pager [phone]

## 2013-04-27 NOTE — Clinical Social Work Psychosocial (Signed)
Clinical Social Work Department BRIEF PSYCHOSOCIAL ASSESSMENT 04/27/2013  Patient:  [name] [name], [name] [name]     Account Number:  [id number]     Admit date:  04/25/2013  Clinical Social Worker:  Lavell Luster  Date/Time:  04/27/2013 12:30 PM  Referred by:  Physician  Date Referred:  04/27/2013 Referred for  SNF Placement   Other Referral:   Interview type:  Patient Other interview type:   Patient and husband interviewed at bedside.    PSYCHOSOCIAL DATA Living Status:  HUSBAND Admitted from facility:   Level of care:   Primary support name:  [name] [name] (husband) Primary support relationship to patient:  SPOUSE Degree of support available:   Support is good.    CURRENT CONCERNS Current Concerns  Post-Acute Placement   Other Concerns:    SOCIAL WORK ASSESSMENT / PLAN CSW consulted for SNF placement. CSW met with patient and husband at bedside. Patient is refusing SNF placement and HHPT. Patient states that her condition makes it very painful to participate in physical therapy and that she just wants to go home with her husband. Patient states that husband will transport her home. RN will be notified.   Assessment/plan status:  No Further Intervention Required Other assessment/ plan:   NONE   Information/referral to community resources:   NONE    PATIENT'S/FAMILY'S RESPONSE TO PLAN OF CARE: Patient wants to return home with no services. She does not want PT services as participating in PT is very painful for her. CSW signing off.     [name] [name], [place], [place], [phone]

## 2013-04-29 NOTE — Telephone Encounter (Signed)
Noted. plz call to schedule f/u appt in next 1-2 weeks

## 2013-05-02 NOTE — Telephone Encounter (Signed)
Appt scheduled

## 2013-05-08 ENCOUNTER — Other Ambulatory Visit: Payer: Self-pay | Admitting: Family Medicine

## 2013-05-09 NOTE — Telephone Encounter (Signed)
Pt checking on status of alprazolam refill; spoke with [facility] and rx ready for pick up. Pt notified; pt said had no transportation to get from [facility] and want s called to [facility]. Advised pt to call [facility] for transfer; pt said she had but no success; spoke with Aundra Millet at [facility] and she will get med transferred. Pt notified.

## 2013-05-09 NOTE — Telephone Encounter (Signed)
Rx called in as directed.   

## 2013-05-09 NOTE — Telephone Encounter (Signed)
plz phone in. 

## 2013-05-11 ENCOUNTER — Encounter: Payer: Self-pay | Admitting: Family Medicine

## 2013-05-11 ENCOUNTER — Ambulatory Visit (INDEPENDENT_AMBULATORY_CARE_PROVIDER_SITE_OTHER): Payer: Medicare Other | Admitting: Family Medicine

## 2013-05-11 VITALS — BP 110/70 | HR 72 | Temp 97.7°F | Wt 129.2 lb

## 2013-05-11 DIAGNOSIS — I10 Essential (primary) hypertension: Secondary | ICD-10-CM

## 2013-05-11 DIAGNOSIS — F17200 Nicotine dependence, unspecified, uncomplicated: Secondary | ICD-10-CM

## 2013-05-11 DIAGNOSIS — E46 Unspecified protein-calorie malnutrition: Secondary | ICD-10-CM

## 2013-05-11 DIAGNOSIS — R079 Chest pain, unspecified: Secondary | ICD-10-CM

## 2013-05-11 DIAGNOSIS — Z23 Encounter for immunization: Secondary | ICD-10-CM

## 2013-05-11 DIAGNOSIS — R21 Rash and other nonspecific skin eruption: Secondary | ICD-10-CM

## 2013-05-11 DIAGNOSIS — R55 Syncope and collapse: Secondary | ICD-10-CM

## 2013-05-11 NOTE — Patient Instructions (Signed)
pneumovax today . Good to see you today, call us with questions. Return in 3 months for wellness exam, prior fasting for blood work.

## 2013-05-11 NOTE — Addendum Note (Signed)
Addended by: Josph Macho A on: 05/11/2013 04:35 PM   Modules accepted: Orders

## 2013-05-11 NOTE — Assessment & Plan Note (Signed)
Unrevealing thorough workup in hospital.  ?orthostatic.

## 2013-05-11 NOTE — Progress Notes (Signed)
Pre-visit discussion using our clinic review tool. No additional management support is needed unless otherwise documented below in the visit note.  

## 2013-05-11 NOTE — Assessment & Plan Note (Signed)
continue to encourage cessation . Down to 2 cig/day.

## 2013-05-11 NOTE — Assessment & Plan Note (Signed)
Off meds, stable.  Lasix for PRN leg swelling

## 2013-05-11 NOTE — Assessment & Plan Note (Signed)
Unclear etiology - has resolved.

## 2013-05-11 NOTE — Assessment & Plan Note (Signed)
Reviewed with pt and husband importance of good nutrition status, continue to encourage ensure when she can afford, and recommeded increase in plant based protein like nuts and beans.

## 2013-05-11 NOTE — Assessment & Plan Note (Signed)
Atypical chest pain thought MSK pain - I concur.  Continue to monitor for now.

## 2013-05-11 NOTE — Progress Notes (Signed)
Subjective:    Patient ID: [name] [name], female    DOB: [birth year], 67 y.o.   MRN: [id number]  HPI CC: hosp f/u  [name] [name] was recently admitted to [facility] with atypical chest pain thought to be musculoskeletal.  Serial enzymes were negative.  EKG did not have acute changes.  She also endorsed a syncopal episode.  2-D echo was done which showed EF of 55-60%, grade 1 diastolic dysfunction, no regional wall motion abnormalities.  Carotid Dopplers showed 1-39% bilateral ICA stenosis.  Chest xray was stable. She endorses worsening of chest pain when laying down and improvement when sitting up watching TV.   She also had a skin rash throughout body that burned that has significantly improved.  This improved on its own.  She also took benadryl for this. All records reviewed.  She also had recent evaluation by orthopedist for knee arthritis because she expressed interest in steroid injections but when seen she refused further treatment.  Smoking - down to 2 cigarettes a day.  She has not been able to afford ensure regularly.  She only eats toast with nutella.    Past Medical History  Diagnosis Date  . Arachnoiditis 1970    due to her Pantopaque myelograms in the 1970s, evidence of this on CT scan 11/2002  . Hypertension   . MVP (mitral valve prolapse)     h/o scarlet fever  . COPD (chronic obstructive pulmonary disease) 2003    by CXR, chronic bronchitis  . HLD (hyperlipidemia)   . Colon polyps   . H/O: CVA (cerebrovascular accident)     TIA  . Urine incontinence   . Anxiety     severe with panic attacks  . CAD (coronary artery disease) 2002    catheterization: 60% RCA stenosis, LAD 30%, L circ 25%  . Hydradenitis 2001    chronic, perineal s/p surgeries ([facility]) ?sweet's syndrome  . Psychogenic syncope 2003  . Orthostatic hypotension 2002  . DDD (degenerative disc disease), lumbar 2003    mult HNP with radiculopathy s/p mult surgeries and fusion  . Chronic pain syndrome     high dose  methadone since hospitalization 05/16/2002, has not tolerated lyrica, cymbalta, zanaflex  . Chronic back pain     failed ESI and sympathetic blocks  . Osteoarthritis of both knees     severe  . Aspiration pneumonia 02/2012    after overdose  . 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 Adenomatous polyp 04/2008    rec rpt colon 3 yrs ([facility])  . 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 Foot fracture, right 2012    ?nonunion  . 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 Smoker 1969  . Complication of anesthesia     DIFFICULT TO WAKE UP  . Family history of anesthesia complication     " MY FATHER "    Review of Systems Per HPI    Objective:   Physical Exam  Nursing note and vitals reviewed. Constitutional: She appears well-developed and well-nourished. No distress.  HENT:  Mouth/Throat: Oropharynx is clear and moist. No oropharyngeal exudate.  Cardiovascular: Normal rate, regular rhythm, normal heart sounds and intact distal pulses.   No murmur heard. Pulmonary/Chest: Effort normal and breath sounds normal. No respiratory distress. She has no wheezes. She has no rales.  Musculoskeletal: She exhibits no  edema.  Skin: Skin is warm and dry. No rash noted.  Psychiatric: She has a normal mood and affect.       Assessment & Plan:

## 2013-05-22 ENCOUNTER — Other Ambulatory Visit: Payer: Self-pay

## 2013-05-22 MED ORDER — METHADONE HCL 10 MG PO TABS: 30.0000 mg | ORAL_TABLET | Freq: Three times a day (TID) | ORAL | Status: DC

## 2013-05-22 NOTE — Telephone Encounter (Signed)
Pt left v/m requesting rx methadone. Call when ready for pick up. Pt said since our office is closed on 05/24/13 pt request to pick up rx on 05/23/13.

## 2013-05-22 NOTE — Telephone Encounter (Signed)
Printed and placed in Kim's box. 

## 2013-05-23 NOTE — Telephone Encounter (Signed)
Patient's husband notified and Rx placed up front for pick up. 

## 2013-06-07 ENCOUNTER — Other Ambulatory Visit: Payer: Self-pay | Admitting: *Deleted

## 2013-06-07 MED ORDER — ALPRAZOLAM 1 MG PO TABS: ORAL_TABLET | ORAL | Status: DC

## 2013-06-07 NOTE — Telephone Encounter (Signed)
Faxed refill request. Please advise.  

## 2013-06-07 NOTE — Telephone Encounter (Signed)
plz phone in. 

## 2013-06-08 NOTE — Telephone Encounter (Signed)
Rx called in as directed.   

## 2013-06-18 ENCOUNTER — Ambulatory Visit (INDEPENDENT_AMBULATORY_CARE_PROVIDER_SITE_OTHER): Payer: Medicare HMO | Admitting: Internal Medicine

## 2013-06-18 ENCOUNTER — Encounter: Payer: Self-pay | Admitting: Internal Medicine

## 2013-06-18 ENCOUNTER — Other Ambulatory Visit: Payer: Self-pay | Admitting: Family Medicine

## 2013-06-18 VITALS — BP 150/80 | HR 100 | Temp 97.7°F | Wt 130.0 lb

## 2013-06-18 DIAGNOSIS — S40029A Contusion of unspecified upper arm, initial encounter: Secondary | ICD-10-CM

## 2013-06-18 DIAGNOSIS — S40022A Contusion of left upper arm, initial encounter: Secondary | ICD-10-CM | POA: Insufficient documentation

## 2013-06-18 NOTE — Assessment & Plan Note (Signed)
Reassured that this is blood in the tissues/muscle and not in vein (so no risk of PE) Discussed warm compresses May take 4-6 weeks to heal

## 2013-06-18 NOTE — Patient Instructions (Signed)
Hematoma A hematoma is a collection of blood under the skin, in an organ, in a body space, in a joint space, or in other tissue. The blood can clot to form a lump that you can see and feel. The lump is often firm and may sometimes become sore and tender. Most hematomas get better in a few days to weeks. However, some hematomas may be serious and require medical care. Hematomas can range in size from very small to very large. CAUSES  A hematoma can be caused by a blunt or penetrating injury. It can also be caused by spontaneous leakage from a blood vessel under the skin. Spontaneous leakage from a blood vessel is more likely to occur in older people, especially those taking blood thinners. Sometimes, a hematoma can develop after certain medical procedures. SIGNS AND SYMPTOMS   A firm lump on the body.  Possible pain and tenderness in the area.  Bruising.Blue, dark blue, purple-red, or yellowish skin may appear at the site of the hematoma if the hematoma is close to the surface of the skin. For hematomas in deeper tissues or body spaces, the signs and symptoms may be subtle. For example, an intra-abdominal hematoma may cause abdominal pain, weakness, fainting, and shortness of breath. An intracranial hematoma may cause a headache or symptoms such as weakness, trouble speaking, or a change in consciousness. DIAGNOSIS  A hematoma can usually be diagnosed based on your medical history and a physical exam. Imaging tests may be needed if your health care provider suspects a hematoma in deeper tissues or body spaces, such as the abdomen, head, or chest. These tests may include ultrasonography or a CT scan.  TREATMENT  Hematomas usually go away on their own over time. Rarely does the blood need to be drained out of the body. Large hematomas or those that may affect vital organs will sometimes need surgical drainage or monitoring. HOME CARE INSTRUCTIONS   Apply ice to the injured area:   Put ice in a  plastic bag.   Place a towel between your skin and the bag.   Leave the ice on for 20 minutes, 2 3 times a day for the first 1 to 2 days.   After the first 2 days, switch to using warm compresses on the hematoma.   Elevate the injured area to help decrease pain and swelling. Wrapping the area with an elastic bandage may also be helpful. Compression helps to reduce swelling and promotes shrinking of the hematoma. Make sure the bandage is not wrapped too tight.   If your hematoma is on a lower extremity and is painful, crutches may be helpful for a couple days.   Only take over-the-counter or prescription medicines as directed by your health care provider. SEEK IMMEDIATE MEDICAL CARE IF:   You have increasing pain, or your pain is not controlled with medicine.   You have a fever.   You have worsening swelling or discoloration.   Your skin over the hematoma breaks or starts bleeding.   Your hematoma is in your chest or abdomen and you have weakness, shortness of breath, or a change in consciousness.  Your hematoma is on your scalp (caused by a fall or injury) and you have a worsening headache or a change in alertness or consciousness. MAKE SURE YOU:   Understand these instructions.  Will watch your condition.  Will get help right away if you are not doing well or get worse. Document Released: 12/23/2003 Document Revised: 01/10/2013 Document Reviewed:   10/18/2012 Exit[employer] Patient Information 2014 [employer].  

## 2013-06-18 NOTE — Progress Notes (Signed)
Subjective:    Patient ID: [name] [name], female    DOB: 26-Jun-1945, 68 y.o.   MRN: [id number]  HPI She falls often Had a bad one 6 days ago and hurt left arm Swelling and bruising proximal to antecubital fossa on left Seems to be enlargening She has felt knots there  Afraid because everyone tells her she has a blood clot  No swelling further down forearm or in hand  Regular chest pain and breathing problems---no apparent change  Current Outpatient Prescriptions on File Prior to Visit  Medication Sig Dispense Refill  . albuterol (PROVENTIL HFA;VENTOLIN HFA) 108 (90 BASE) MCG/ACT inhaler Inhale 2 puffs into the lungs every 6 (six) hours as needed for wheezing.  1 Inhaler  2  . ALPRAZolam (XANAX) 1 MG tablet TAKE ONE (1) TABLET FOUR TIMES EACH DAY AS NEEDED  120 tablet  0  . aspirin 81 MG chewable tablet Chew 1 tablet (81 mg total) by mouth daily.      . Calcium Carb-Cholecalciferol (CALCIUM-VITAMIN D) 600-400 MG-UNIT TABS Take 1 tablet by mouth 2 (two) times daily.      . furosemide (LASIX) 20 MG tablet Take 1 tablet (20 mg total) by mouth daily as needed (swelling).  30 tablet  0  . gabapentin (NEURONTIN) 300 MG capsule Take by mouth See admin instructions. 600 mg at lunch and 900 mg at bedtime      . methadone (DOLOPHINE) 10 MG tablet Take 3-4 tablets (30-40 mg total) by mouth every 8 (eight) hours.  330 tablet  0  . Multiple Vitamin (MULTIVITAMIN WITH MINERALS) TABS Take 1 tablet by mouth daily.      . nitroGLYCERIN (NITROSTAT) 0.4 MG SL tablet Place 1 tablet (0.4 mg total) under the tongue every 5 (five) minutes as needed for chest pain.  30 tablet  12  . [DISCONTINUED] diphenhydrAMINE (BENADRYL) 25 MG tablet Take 25 mg by mouth at bedtime as needed. For allergy symptoms       No current facility-administered medications on file prior to visit.    Allergies  Allergen Reactions  . Penicillins Anaphylaxis  . Sulfa Antibiotics Anaphylaxis  . Sertraline Hcl Other (See Comments)    Caused pt to be hyper and confused  . Beta Adrenergic Blockers     Bradycardia, orthostatic hypotension  . Mushroom Extract Complex     unknwon  . Other     ALL ANTI DEPRESSANTS; MUSHROOMS  . Toradol [Ketorolac Tromethamine] Nausea And Vomiting  . Tylenol [Acetaminophen] Nausea And Vomiting  . Versed [Midazolam]     DID NOT WORK  . Codeine Itching and Rash    Past Medical History  Diagnosis Date  . Arachnoiditis 1970    due to her Pantopaque myelograms in the 1970s, evidence of this on CT scan 11/2002  . Hypertension   . MVP (mitral valve prolapse)     h/o scarlet fever  . COPD (chronic obstructive pulmonary disease) 2003    by CXR, chronic bronchitis  . HLD (hyperlipidemia)   . Colon polyps   . H/O: CVA (cerebrovascular accident)     TIA  . Urine incontinence   . Anxiety     severe with panic attacks  . CAD (coronary artery disease) 2002    catheterization: 60% RCA stenosis, LAD 30%, L circ 25%  . Hydradenitis 2001    chronic, perineal s/p surgeries ([facility]) ?sweet's syndrome  . Psychogenic syncope 2003  . Orthostatic hypotension 2002  . DDD (degenerative disc disease), lumbar 2003  mult HNP with radiculopathy s/p mult surgeries and fusion  . Chronic pain syndrome     high dose methadone since hospitalization 05/16/2002, has not tolerated lyrica, cymbalta, zanaflex  . Chronic back pain     failed ESI and sympathetic blocks  . Osteoarthritis of both knees     severe  . Aspiration pneumonia 02/2012    after overdose  . 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 Adenomatous polyp 04/2008    rec rpt colon 3 yrs ([facility])  . 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 Foot fracture, right 2012    ?nonunion  . Angiomyolipoma of right kidney 2014    87mm R angiomyolipoma  . Smoker 1969  . Complication of anesthesia     DIFFICULT TO  WAKE UP    Past Surgical History  Procedure Laterality Date  . Laminectomy  1976,1977,1999,2003    x 4  . Abdominal hysterectomy  1985  . Appendectomy  1963  . Breast biopsy  1966  . Cardiac catheterization  2004  . Lumbar fusion  2003    L4/5 laminotomy with posterior lumbar interbody fusion ([facility])  . Knee arthroscopy Left     [facility]by [facility]  . Colonoscopy  04/2008    tubular adenoma, int hem, rec rpt 3 yrs - Dr. Michail Sermon with [facility]    Family History  Problem Relation Age of Onset  . Arthritis    . CAD Mother     MI  . CAD Father     MI s/p 3v CABG  . Kidney failure Father     ESRD on HD  . Stroke Mother   . Cancer Neg Hx   . Diabetes Neg Hx   . Deep vein thrombosis Mother   . Deep vein thrombosis Father   . Deep vein thrombosis Paternal Grandfather     History   Social History  . Marital Status: Married    Spouse Name: N/A    Number of Children: N/A  . Years of Education: N/A   Occupational History  . Not on file.   Social History Main Topics  . Smoking status: Current Every Day Smoker -- 0.25 packs/day for 45 years    Types: Cigarettes    Start date: 05/25/1967  . Smokeless tobacco: Never Used     Comment: 2 cigarettes  . Alcohol Use: No  . Drug Use: No  . Sexual Activity: Not on file   Other Topics Concern  . Not on file   Social History Narrative   Lives with husband and cousin.  Has 6 rescued cats at home.   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

## 2013-06-18 NOTE — Telephone Encounter (Signed)
Pt's husband filled out triage form requesting Methadone refill for 06/24/13.  He wrote on triage form that he knows it is early but he wanted to go ahead and request it.

## 2013-06-18 NOTE — Progress Notes (Signed)
Pre-visit discussion using our clinic review tool. No additional management support is needed unless otherwise documented below in the visit note.  

## 2013-06-19 MED ORDER — METHADONE HCL 10 MG PO TABS: 30.0000 mg | ORAL_TABLET | Freq: Three times a day (TID) | ORAL | Status: DC

## 2013-06-19 NOTE — Telephone Encounter (Signed)
Ok to refill 

## 2013-06-19 NOTE — Telephone Encounter (Signed)
Printed and placed in Kim's box. Does husband want to pick up his own at same time?

## 2013-06-20 NOTE — Telephone Encounter (Signed)
Patient's husband notified and Rx placed up front for pick up. He would like his as well. Request submitted to your IN basket.

## 2013-06-21 NOTE — Telephone Encounter (Signed)
Pt's husband said does not have transportation to come to office and will send his grandson [name] [name] to office later today to pick up rx.

## 2013-06-26 ENCOUNTER — Telehealth: Payer: Self-pay | Admitting: Family Medicine

## 2013-06-26 NOTE — Telephone Encounter (Signed)
Relevant patient education mailed to patient.  

## 2013-06-29 ENCOUNTER — Ambulatory Visit: Payer: Medicare Other | Admitting: Family Medicine

## 2013-06-29 DIAGNOSIS — Z0289 Encounter for other administrative examinations: Secondary | ICD-10-CM

## 2013-07-11 ENCOUNTER — Other Ambulatory Visit: Payer: Self-pay

## 2013-07-11 MED ORDER — ALPRAZOLAM 1 MG PO TABS: ORAL_TABLET | ORAL | Status: DC

## 2013-07-11 NOTE — Telephone Encounter (Signed)
Rx called in as directed. Mr. Oshima notified.

## 2013-07-11 NOTE — Telephone Encounter (Signed)
Pt left v/m requesting refill alprazolam to [facility]. Pt has been out of med for 4 days and request refill done today and request cb when done.

## 2013-07-11 NOTE — Telephone Encounter (Signed)
plz phone in and notify patient. 

## 2013-07-17 ENCOUNTER — Telehealth: Payer: Self-pay | Admitting: *Deleted

## 2013-07-17 NOTE — Telephone Encounter (Signed)
Received a letter via mail from [facility] that methadone required a prior authorization. Forms obtained, were completed by Dr, and faxed to [facility]. Pending notification from insurance.

## 2013-07-18 ENCOUNTER — Other Ambulatory Visit: Payer: Self-pay | Admitting: Family Medicine

## 2013-07-18 ENCOUNTER — Other Ambulatory Visit: Payer: Self-pay

## 2013-07-18 MED ORDER — METHADONE HCL 10 MG PO TABS: 30.0000 mg | ORAL_TABLET | Freq: Three times a day (TID) | ORAL | Status: DC

## 2013-07-18 NOTE — Telephone Encounter (Signed)
Patient notified and Rx placed up front for pick up. 

## 2013-07-18 NOTE — Telephone Encounter (Signed)
Pt left /vm requesting rx methadone. Call when ready for pick up. 

## 2013-07-18 NOTE — Telephone Encounter (Signed)
Printed and placed in Kim's box. 

## 2013-07-18 NOTE — Telephone Encounter (Signed)
Methadone denied. Letter in your IN box for review.

## 2013-07-19 ENCOUNTER — Ambulatory Visit: Payer: Medicare HMO | Admitting: Family Medicine

## 2013-07-26 ENCOUNTER — Ambulatory Visit (INDEPENDENT_AMBULATORY_CARE_PROVIDER_SITE_OTHER): Payer: Medicare HMO | Admitting: Family Medicine

## 2013-07-26 ENCOUNTER — Encounter: Payer: Self-pay | Admitting: Family Medicine

## 2013-07-26 VITALS — BP 164/100 | HR 102 | Temp 98.5°F | Wt 127.2 lb

## 2013-07-26 DIAGNOSIS — G030 Nonpyogenic meningitis: Secondary | ICD-10-CM

## 2013-07-26 DIAGNOSIS — A879 Viral meningitis, unspecified: Secondary | ICD-10-CM

## 2013-07-26 DIAGNOSIS — F17200 Nicotine dependence, unspecified, uncomplicated: Secondary | ICD-10-CM

## 2013-07-26 DIAGNOSIS — I10 Essential (primary) hypertension: Secondary | ICD-10-CM

## 2013-07-26 MED ORDER — AMLODIPINE BESYLATE 2.5 MG PO TABS: 2.5000 mg | ORAL_TABLET | Freq: Every day | ORAL | Status: DC

## 2013-07-26 NOTE — Assessment & Plan Note (Signed)
Endorses quitting several days ago.  Congratulated!

## 2013-07-26 NOTE — Assessment & Plan Note (Signed)
Last several readings have been persistently elevated - will start amlodipine 2.5mg  daily.   Recheck Cr today. Pt agrees with plan. Discussed low salt, good water intake.

## 2013-07-26 NOTE — Progress Notes (Signed)
Pre visit review using our clinic review tool, if applicable. No additional management support is needed unless otherwise documented below in the visit note. 

## 2013-07-26 NOTE — Patient Instructions (Addendum)
Let's start low dose amlodipine 2.5mg  daily for recently elevated blood pressures. Call insurance to ask about denial of methadone. Check kidney function today.

## 2013-07-26 NOTE — Assessment & Plan Note (Signed)
Continue methadone.  I've asked patient to call insurance to inquire why this was denied.

## 2013-07-26 NOTE — Progress Notes (Signed)
BP 164/100  Pulse 102  Temp(Src) 98.5 F (36.9 C) (Oral)  Wt 127 lb 4 oz (57.72 kg)  SpO2 96%   CC: leg pain  Subjective:    Patient ID: [name] [name], female    DOB: [birth year], 68 y.o.   MRN: [id number]  HPI: [name] [name] is a 68 y.o. female presenting on 07/26/2013 with Pain in legs   Known leg pain on methadone for h/o nonbacterial arachnoiditis and significant chronic lumbar issues.  Insurance has denied methadone refill.  She suffered a fall 1 wk ago while closing washing machine.  Hit face.  Was using cane with this.  bp elevated today.  States at home bp running 170/103, last checked 2 wks ago.  Not using lasix. BP Readings from Last 3 Encounters:  07/26/13 164/100  06/18/13 150/80  05/11/13 110/70  Body mass index is 22.55 kg/(m^2).  Wt Readings from Last 3 Encounters:  07/26/13 127 lb 4 oz (57.72 kg)  06/18/13 130 lb (58.968 kg)  05/11/13 129 lb 4 oz (58.627 kg)   Notes some numbness right thoracic region.  Father with h/o dialysis use.  Relevant past medical, surgical, family and social history reviewed and updated as indicated.  Allergies and medications reviewed and updated. Current Outpatient Prescriptions on File Prior to Visit  Medication Sig  . albuterol (PROVENTIL HFA;VENTOLIN HFA) 108 (90 BASE) MCG/ACT inhaler Inhale 2 puffs into the lungs every 6 (six) hours as needed for wheezing.  Marland Kitchen ALPRAZolam (XANAX) 1 MG tablet TAKE ONE (1) TABLET FOUR TIMES EACH DAY AS NEEDED  . aspirin 81 MG chewable tablet Chew 1 tablet (81 mg total) by mouth daily.  . Calcium Carb-Cholecalciferol (CALCIUM-VITAMIN D) 600-400 MG-UNIT TABS Take 1 tablet by mouth 2 (two) times daily.  . furosemide (LASIX) 20 MG tablet Take 1 tablet (20 mg total) by mouth daily as needed (swelling).  . gabapentin (NEURONTIN) 300 MG capsule Take by mouth See admin instructions. 600 mg at lunch and 900 mg at bedtime  . methadone (DOLOPHINE) 10 MG tablet Take 3-4 tablets (30-40 mg total) by mouth every  8 (eight) hours.  . Multiple Vitamin (MULTIVITAMIN WITH MINERALS) TABS Take 1 tablet by mouth daily.  . nitroGLYCERIN (NITROSTAT) 0.4 MG SL tablet Place 1 tablet (0.4 mg total) under the tongue every 5 (five) minutes as needed for chest pain.  . [DISCONTINUED] diphenhydrAMINE (BENADRYL) 25 MG tablet Take 25 mg by mouth at bedtime as needed. For allergy symptoms   No current facility-administered medications on file prior to visit.    Review of Systems Per HPI unless specifically indicated above    Objective:    BP 164/100  Pulse 102  Temp(Src) 98.5 F (36.9 C) (Oral)  Wt 127 lb 4 oz (57.72 kg)  SpO2 96%  Physical Exam  Nursing note and vitals reviewed. Constitutional: She appears well-developed and well-nourished. No distress.  HENT:  Mouth/Throat: Oropharynx is clear and moist. No oropharyngeal exudate.  Cardiovascular: Normal rate, regular rhythm, normal heart sounds and intact distal pulses.   No murmur heard. Pulmonary/Chest: Effort normal and breath sounds normal. No respiratory distress. She has no wheezes. She has no rales.  Musculoskeletal: She exhibits no edema.       Assessment & Plan:   Problem List Items Addressed This Visit   Hypertension - Primary     Last several readings have been persistently elevated - will start amlodipine 2.5mg  daily.   Recheck Cr today. Pt agrees with plan. Discussed low salt,  good water intake.    Relevant Medications      amLODIpine (NORVASC)  tablet   Other Relevant Orders      Renal function panel   Smoker     Endorses quitting several days ago.  Congratulated!        Follow up plan: Return if symptoms worsen or fail to improve, for wellness exam, prior fasting for blood work.

## 2013-07-27 ENCOUNTER — Telehealth: Payer: Self-pay | Admitting: Family Medicine

## 2013-07-27 LAB — RENAL FUNCTION PANEL
ALBUMIN: 3.6 g/dL (ref 3.5–5.2)
BUN: 26 mg/dL — AB (ref 6–23)
CHLORIDE: 106 meq/L (ref 96–112)
CO2: 23 meq/L (ref 19–32)
Calcium: 10.6 mg/dL — ABNORMAL HIGH (ref 8.4–10.5)
Creatinine, Ser: 1.8 mg/dL — ABNORMAL HIGH (ref 0.4–1.2)
GFR: 30.64 mL/min — ABNORMAL LOW (ref >60.00)
Glucose, Bld: 78 mg/dL (ref 70–99)
Phosphorus: 2.5 mg/dL (ref 2.3–4.6)
Potassium: 4.5 mEq/L (ref 3.5–5.1)
Sodium: 139 mEq/L (ref 135–145)

## 2013-07-27 NOTE — Telephone Encounter (Signed)
emmi report mailed to patient ° °

## 2013-07-27 NOTE — Telephone Encounter (Signed)
Relevant patient education mailed to patient.  

## 2013-08-01 NOTE — Telephone Encounter (Signed)
Discussed at office visit, advised pt to call insurance to inquire why denied.

## 2013-08-05 NOTE — Telephone Encounter (Signed)
Letter written and in chart.  plz mail to insurance.  Placed denial letter in Kim's box. plz send latest urine drug screen dated 10/18/2012 as well.

## 2013-08-06 ENCOUNTER — Other Ambulatory Visit: Payer: Self-pay | Admitting: Family Medicine

## 2013-08-06 NOTE — Telephone Encounter (Signed)
Rx called in as directed.   

## 2013-08-06 NOTE — Telephone Encounter (Signed)
Letter and drug screen faxed for expedited decision prior to next refill (due in 10 days). Will await determination.

## 2013-08-06 NOTE — Telephone Encounter (Signed)
plz phone in. 

## 2013-08-20 ENCOUNTER — Other Ambulatory Visit: Payer: Self-pay

## 2013-08-20 NOTE — Telephone Encounter (Signed)
Pt request rx methadone. Call when ready for pick up.

## 2013-08-21 ENCOUNTER — Other Ambulatory Visit: Payer: Self-pay | Admitting: Family Medicine

## 2013-08-21 MED ORDER — METHADONE HCL 10 MG PO TABS: 30.0000 mg | ORAL_TABLET | Freq: Three times a day (TID) | ORAL | Status: DC

## 2013-08-21 NOTE — Telephone Encounter (Signed)
Patient's husband came to office to pick script up for patient.

## 2013-08-21 NOTE — Telephone Encounter (Signed)
Printed and placed in Kim's box. 

## 2013-08-21 NOTE — Telephone Encounter (Signed)
Left message with husband to call back.

## 2013-08-31 ENCOUNTER — Other Ambulatory Visit: Payer: Self-pay | Admitting: Family Medicine

## 2013-08-31 NOTE — Telephone Encounter (Signed)
Electronic refill request, please advise  

## 2013-09-01 NOTE — Telephone Encounter (Signed)
plz phone in. 

## 2013-09-03 NOTE — Telephone Encounter (Signed)
Rx called in as directed.   

## 2013-09-04 ENCOUNTER — Other Ambulatory Visit: Payer: Self-pay | Admitting: Family Medicine

## 2013-09-17 ENCOUNTER — Other Ambulatory Visit: Payer: Self-pay | Admitting: Family Medicine

## 2013-09-17 NOTE — Telephone Encounter (Signed)
Ok to refill 

## 2013-09-18 NOTE — Telephone Encounter (Signed)
printed and placed in kim's box 

## 2013-09-19 NOTE — Telephone Encounter (Signed)
Patient's husband notified and Rx placed up front for pick up. 

## 2013-09-24 ENCOUNTER — Ambulatory Visit (INDEPENDENT_AMBULATORY_CARE_PROVIDER_SITE_OTHER): Payer: Medicare Other | Admitting: Family Medicine

## 2013-09-24 ENCOUNTER — Encounter: Payer: Self-pay | Admitting: Family Medicine

## 2013-09-24 VITALS — BP 130/82 | HR 62 | Temp 98.0°F | Wt 130.5 lb

## 2013-09-24 DIAGNOSIS — M25579 Pain in unspecified ankle and joints of unspecified foot: Secondary | ICD-10-CM

## 2013-09-24 DIAGNOSIS — M25572 Pain in left ankle and joints of left foot: Secondary | ICD-10-CM | POA: Insufficient documentation

## 2013-09-24 NOTE — Assessment & Plan Note (Signed)
Not consistent with DVT or with ankle fracture. Reassured.  Likely bony contusion. Should heal with time.  rec ice, elevation of leg. No further intervention warranted.

## 2013-09-24 NOTE — Progress Notes (Signed)
   BP 130/82  Pulse 62  Temp(Src) 98 F (36.7 C) (Oral)  Wt 130 lb 8 oz (59.194 kg)  SpO2 97%   CC: fall with L leg injury Subjective:    Patient ID: [name] [name], female    DOB: [birth year], 68 y.o.   MRN: [id number]  HPI: [name] [name] is a 68 y.o. female presenting on 09/24/2013 for Fall   DOI: about 10 days ago Pt fell down and heavy chair fell on top of her,caught on her house coat and landed left lateral ankle.  Now noticing knot just proximal to lateral malleolus.  Pain manageable.    Compliant with lisinopril hctz 10/21.5mg  daily.  Takes lasix 1-2x/month   Relevant past medical, surgical, family and social history reviewed and updated as indicated.  Allergies and medications reviewed and updated. Current Outpatient Prescriptions on File Prior to Visit  Medication Sig  . albuterol (PROVENTIL HFA;VENTOLIN HFA) 108 (90 BASE) MCG/ACT inhaler Inhale 2 puffs into the lungs every 6 (six) hours as needed for wheezing.  Marland Kitchen ALPRAZolam (XANAX) 1 MG tablet TAKE ONE (1) TABLET FOUR TIMES EACH DAY AS NEEDED  . aspirin 81 MG chewable tablet Chew 1 tablet (81 mg total) by mouth daily.  . Calcium Carb-Cholecalciferol (CALCIUM-VITAMIN D) 600-400 MG-UNIT TABS Take 1 tablet by mouth 2 (two) times daily.  . furosemide (LASIX) 20 MG tablet Take 1 tablet (20 mg total) by mouth daily as needed (swelling).  . gabapentin (NEURONTIN) 300 MG capsule Take by mouth See admin instructions. 600 mg at lunch and 900 mg at bedtime  . gabapentin (NEURONTIN) 300 MG capsule TAKE 4 CAPSULES AT BEDTIME AND TAKE 1 CAPSULE AT LUNCHTIME  . methadone (DOLOPHINE) 10 MG tablet TAKE 3 TO 4 TABLETS EVERY 8 HOURS  . Multiple Vitamin (MULTIVITAMIN WITH MINERALS) TABS Take 1 tablet by mouth daily.  . nitroGLYCERIN (NITROSTAT) 0.4 MG SL tablet Place 1 tablet (0.4 mg total) under the tongue every 5 (five) minutes as needed for chest pain.  . [DISCONTINUED] diphenhydrAMINE (BENADRYL) 25 MG tablet Take 25 mg by mouth at bedtime as  needed. For allergy symptoms   No current facility-administered medications on file prior to visit.    Review of Systems Per HPI unless specifically indicated above    Objective:    BP 130/82  Pulse 62  Temp(Src) 98 F (36.7 C) (Oral)  Wt 130 lb 8 oz (59.194 kg)  SpO2 97%  Physical Exam  Nursing note and vitals reviewed. Constitutional: She appears well-developed and well-nourished. No distress.  Musculoskeletal: She exhibits no edema.  Mild tenderness to palpation at lateral malleolus. No knot/nodule appreciated. 2+ DP on left. No palpable cords, no unilateral edema or erythema, no calf pain/swelling. FROM at L ankle No pain at calcaneus or heel       Assessment & Plan:   Problem List Items Addressed This Visit   Left ankle pain - Primary     Not consistent with DVT or with ankle fracture. Reassured.  Likely bony contusion. Should heal with time.  rec ice, elevation of leg. No further intervention warranted.        Follow up plan: Return if symptoms worsen or fail to improve.

## 2013-09-24 NOTE — Patient Instructions (Signed)
This is not a blood clot or a fracture - likely bony bruise from chair falling on the ankle Should improve with time.  Ok to use wrapped ice and elevate let to speed recovery. Let me know if not getting better.

## 2013-09-24 NOTE — Progress Notes (Signed)
Pre visit review using our clinic review tool, if applicable. No additional management support is needed unless otherwise documented below in the visit note. 

## 2013-10-01 ENCOUNTER — Other Ambulatory Visit: Payer: Self-pay | Admitting: Family Medicine

## 2013-10-01 NOTE — Telephone Encounter (Signed)
plz phone in. 

## 2013-10-01 NOTE — Telephone Encounter (Signed)
Ok to refill 

## 2013-10-02 NOTE — Telephone Encounter (Signed)
Rx called in as directed.   

## 2013-10-09 ENCOUNTER — Telehealth: Payer: Self-pay | Admitting: Family Medicine

## 2013-10-09 NOTE — Telephone Encounter (Signed)
if they desire to stay in [place] would recommend [facility] or  eye center.

## 2013-10-09 NOTE — Telephone Encounter (Signed)
Pt called and is having some trouble with her eyesight, cannot read even with inexpensive eyeglasses.  Would like Dr. Danise Mina to recommend an opthalmologist.  Best number to call pt [phone] / lt

## 2013-10-10 NOTE — Telephone Encounter (Signed)
Message left notifying patient.

## 2013-10-17 ENCOUNTER — Telehealth: Payer: Self-pay | Admitting: Family Medicine

## 2013-10-17 ENCOUNTER — Other Ambulatory Visit: Payer: Self-pay | Admitting: Family Medicine

## 2013-10-17 NOTE — Telephone Encounter (Signed)
Agree - will evaluate at office visit. We have discussed multiple times previously that medication regimen will not be changed over phone. Would offer office visit for tomorrow.

## 2013-10-17 NOTE — Telephone Encounter (Signed)
With her current med list- I do not feel comfortable adding anything else on -I will also cc Dr Darnell Level. Follow up tomorrow as planned -go to ER if needed

## 2013-10-17 NOTE — Telephone Encounter (Signed)
Ok to refill 

## 2013-10-17 NOTE — Telephone Encounter (Signed)
Pt notified of Dr. Marliss Coots comments/recommendations. Pt advise if she can't schedule an appt tomorrow she may want to go to ER for eval. Pt was very upset and said "You tell Dr. Glori Bickers and Maudie Mercury to go fly a Kite" and hung up.

## 2013-10-17 NOTE — Telephone Encounter (Signed)
Patient Information:  Caller Name: Rhythm  Phone: [phone]  Patient: [name] [name], [name] [name]  Gender: Female  DOB: [birth year]  Age: 68 Years  PCP: Ria Bush [facility])  Office Follow Up:  Does the office need to follow up with this patient?: Yes  Instructions For The Office: No appts available in the office.  She declines ER.  She is requesitng medication for sleep or pain until office visit on Friday.  Please contact patient.  Ph[facility].  RN Note:  No appts available in the office.  She declines ER.  She is requesitng medication for sleep or pain until office visit on Friday.  Please contact patient.  Pharm[facility].  Symptoms  Reason For Call & Symptoms: Patient states she is in pain all week. She reports she has Non Bacterial  Arachnoiditis.  She is on Xanax 1mg  qid and Methadone 10mg  three  tablets tid.  She reports Bilateral leg pain and  her legs will "not be still" . Her legs are jumping and pain is worse. She is unable to rest or sleep.  She has increased the Xanax to 2 pills at night and it is not helping. She has an appt for Friday 10/19/13 but does not think she can wait. She cannot come to the office tomorrow.  Reviewed Health History In EMR: Yes  Reviewed Medications In EMR: Yes  Reviewed Allergies In EMR: Yes  Reviewed Surgeries / Procedures: Yes  Date of Onset of Symptoms: 10/10/2013  Treatments Tried: Xanax and Methadone  Treatments Tried Worked: No  Guideline(s) Used:  Leg Pain  Disposition Per Guideline:   Go to Office Now  Reason For Disposition Reached:   Severe pain (e.g., excruciating, unable to do any normal activities)  Advice Given:  Pain Medicines:  For pain relief, you can take either acetaminophen, ibuprofen, or naproxen.  They are over-the-counter (OTC) pain drugs. You can buy them at the drugstore.  RN Overrode Recommendation:  Patient Requests Prescription  No appts available in the office.  She declines ER.  She is  requesitng medication for sleep or pain until office visit on Friday.  Please contact patient.  Pharmacy [facility].

## 2013-10-18 ENCOUNTER — Ambulatory Visit: Payer: Medicare Other | Admitting: Family Medicine

## 2013-10-18 NOTE — Telephone Encounter (Signed)
Patient's husband notified and Rx placed up front for pick up. 

## 2013-10-18 NOTE — Telephone Encounter (Signed)
Printed and placed in Kim's box. 

## 2013-10-19 ENCOUNTER — Encounter: Payer: Self-pay | Admitting: Family Medicine

## 2013-10-19 ENCOUNTER — Ambulatory Visit (INDEPENDENT_AMBULATORY_CARE_PROVIDER_SITE_OTHER): Payer: Medicare Other | Admitting: Family Medicine

## 2013-10-19 VITALS — BP 122/68 | HR 87 | Temp 97.8°F | Wt 125.0 lb

## 2013-10-19 DIAGNOSIS — G2581 Restless legs syndrome: Secondary | ICD-10-CM

## 2013-10-19 DIAGNOSIS — N189 Chronic kidney disease, unspecified: Secondary | ICD-10-CM

## 2013-10-19 DIAGNOSIS — I10 Essential (primary) hypertension: Secondary | ICD-10-CM

## 2013-10-19 DIAGNOSIS — M79609 Pain in unspecified limb: Secondary | ICD-10-CM

## 2013-10-19 DIAGNOSIS — M79606 Pain in leg, unspecified: Principal | ICD-10-CM

## 2013-10-19 DIAGNOSIS — M549 Dorsalgia, unspecified: Secondary | ICD-10-CM

## 2013-10-19 DIAGNOSIS — G8929 Other chronic pain: Secondary | ICD-10-CM

## 2013-10-19 MED ORDER — ROPINIROLE HCL 0.25 MG PO TABS
0.2500 mg | ORAL_TABLET | Freq: Every day | ORAL | Status: DC
Start: 2013-10-19 — End: 2013-11-15

## 2013-10-19 NOTE — Progress Notes (Signed)
BP 122/68  Pulse 87  Temp(Src) 97.8 F (36.6 C) (Oral)  Wt 125 lb (56.7 kg)  SpO2 95%   CC: leg pain flare  Subjective:    Patient ID: [name] [name], female    DOB: 03-12-1946, 68 y.o.   MRN: [id number]  HPI: [name] [name] is a 68 y.o. female presenting on 10/19/2013 for Leg Pain   Worse pain this past week.  Trouble sleeping 2/2 leg pain. Describes burning pain on inside from bilateral hips down to feet. But also describes restless sensation in legs that also affects ability to sleep.  Panic attacks have increased 100%. Mood swings. Has not tolerated multiple antidepressants in the past.  Taking up to 12 gabapentin pills daily. Had trouble tolerating lyrica trial in past but may consider retrial of this. Apologizes for rudeness to [place] on Wednesday.  Relevant past medical, surgical, family and social history reviewed and updated as indicated.  Allergies and medications reviewed and updated. Current Outpatient Prescriptions on File Prior to Visit  Medication Sig  . albuterol (PROVENTIL HFA;VENTOLIN HFA) 108 (90 BASE) MCG/ACT inhaler Inhale 2 puffs into the lungs every 6 (six) hours as needed for wheezing.  Marland Kitchen ALPRAZolam (XANAX) 1 MG tablet TAKE ONE (1) TABLET FOUR TIMES EACH DAY AS NEEDED  . aspirin 81 MG chewable tablet Chew 1 tablet (81 mg total) by mouth daily.  . Calcium Carb-Cholecalciferol (CALCIUM-VITAMIN D) 600-400 MG-UNIT TABS Take 1 tablet by mouth 2 (two) times daily.  . furosemide (LASIX) 20 MG tablet Take 1 tablet (20 mg total) by mouth daily as needed (swelling).  . gabapentin (NEURONTIN) 300 MG capsule Take by mouth See admin instructions. 600 mg at lunch and 900 mg at bedtime  . methadone (DOLOPHINE) 10 MG tablet TAKE 3 TO 4 TABLETS EVERY 8 HOURS  . Multiple Vitamin (MULTIVITAMIN WITH MINERALS) TABS Take 1 tablet by mouth daily.  Marland Kitchen lisinopril-hydrochlorothiazide (PRINZIDE,ZESTORETIC) 10-12.5 MG per tablet Take 1 tablet by mouth daily.  . nitroGLYCERIN (NITROSTAT)  0.4 MG SL tablet Place 1 tablet (0.4 mg total) under the tongue every 5 (five) minutes as needed for chest pain.  . [DISCONTINUED] diphenhydrAMINE (BENADRYL) 25 MG tablet Take 25 mg by mouth at bedtime as needed. For allergy symptoms   No current facility-administered medications on file prior to visit.    Review of Systems Per HPI unless specifically indicated above    Objective:    BP 122/68  Pulse 87  Temp(Src) 97.8 F (36.6 C) (Oral)  Wt 125 lb (56.7 kg)  SpO2 95%  Physical Exam  Nursing note and vitals reviewed. Constitutional: She appears well-developed and well-nourished. No distress.  Antalgic gait with walker  Musculoskeletal: She exhibits no edema.  Mildly tender to palpation bilateral legs  Skin: Skin is warm and dry. No rash noted.       Assessment & Plan:   Problem List Items Addressed This Visit   Hypertension      Controlled currently with prn ACEI/HCTZ and prn lasix BP Readings from Last 3 Encounters:  10/19/13 122/68  09/24/13 130/82  07/26/13 164/100      CKD (chronic kidney disease)     Recheck today.    Relevant Orders      Renal function panel (Completed)   Chronic leg pain - Primary     Anticipate arachnoiditis flare, but also describes restless leg symptoms. Check iron panel today, start trial of requip for possible RLS. Check CPK. No changes to methadone or xanax today. We  have discussed in past I will not change pain regimen over the phone and again reviewed this with patient.    Relevant Orders      CK (Completed)   Chronic back pain    Other Visit Diagnoses   Restless legs        Relevant Orders       Ferritin (Completed)       IBC panel (Completed)        Follow up plan: Return if symptoms worsen or fail to improve.

## 2013-10-19 NOTE — Progress Notes (Signed)
Pre visit review using our clinic review tool, if applicable. No additional management support is needed unless otherwise documented below in the visit note. 

## 2013-10-19 NOTE — Patient Instructions (Addendum)
Blood work today to check on restless legs as well as kidneys. We will call you with results. May try requip for possible restless legs.  Start at 1 tablet nightly for 3 nights then may go up to 2 tablets nightly.

## 2013-10-20 DIAGNOSIS — M79606 Pain in leg, unspecified: Principal | ICD-10-CM

## 2013-10-20 DIAGNOSIS — G8929 Other chronic pain: Secondary | ICD-10-CM | POA: Insufficient documentation

## 2013-10-20 LAB — RENAL FUNCTION PANEL
Albumin: 4.1 g/dL (ref 3.5–5.2)
BUN: 30 mg/dL — ABNORMAL HIGH (ref 6–23)
CO2: 24 meq/L (ref 19–32)
Calcium: 10.2 mg/dL (ref 8.4–10.5)
Chloride: 107 mEq/L (ref 96–112)
Creat: 2.11 mg/dL — ABNORMAL HIGH (ref 0.50–1.10)
Glucose, Bld: 97 mg/dL (ref 70–99)
Phosphorus: 4.7 mg/dL — ABNORMAL HIGH (ref 2.3–4.6)
Potassium: 4.8 mEq/L (ref 3.5–5.3)
Sodium: 141 mEq/L (ref 135–145)

## 2013-10-20 LAB — IBC PANEL
%SAT: 35 % (ref 20–55)
TIBC: 332 ug/dL (ref 250–470)
UIBC: 217 ug/dL (ref 125–400)

## 2013-10-20 LAB — CK: Total CK: 43 U/L (ref 7–177)

## 2013-10-20 LAB — FERRITIN: Ferritin: 96 ng/mL (ref 10–291)

## 2013-10-20 LAB — IRON: Iron: 115 ug/dL (ref 42–145)

## 2013-10-20 NOTE — Assessment & Plan Note (Signed)
Controlled currently with prn ACEI/HCTZ and prn lasix BP Readings from Last 3 Encounters:  10/19/13 122/68  09/24/13 130/82  07/26/13 164/100

## 2013-10-20 NOTE — Assessment & Plan Note (Addendum)
Anticipate arachnoiditis flare, but also describes restless leg symptoms. Check iron panel today, start trial of requip for possible RLS. Check CPK. No changes to methadone or xanax today. We have discussed in past I will not change pain regimen over the phone and again reviewed this with patient.

## 2013-10-20 NOTE — Assessment & Plan Note (Signed)
Recheck today. 

## 2013-10-22 ENCOUNTER — Encounter: Payer: Self-pay | Admitting: *Deleted

## 2013-11-02 ENCOUNTER — Other Ambulatory Visit: Payer: Self-pay | Admitting: Family Medicine

## 2013-11-02 NOTE — Telephone Encounter (Signed)
Rx called in as directed.   

## 2013-11-02 NOTE — Telephone Encounter (Signed)
plz phone in. 

## 2013-11-02 NOTE — Telephone Encounter (Signed)
Ok to refill 

## 2013-11-14 ENCOUNTER — Other Ambulatory Visit: Payer: Self-pay | Admitting: Family Medicine

## 2013-11-14 NOTE — Telephone Encounter (Signed)
Electronic refill request, please advise  

## 2013-11-15 ENCOUNTER — Encounter: Payer: Self-pay | Admitting: Family Medicine

## 2013-11-15 ENCOUNTER — Ambulatory Visit (INDEPENDENT_AMBULATORY_CARE_PROVIDER_SITE_OTHER): Payer: Medicare Other | Admitting: Family Medicine

## 2013-11-15 VITALS — BP 130/88 | HR 96 | Temp 98.0°F | Wt 123.5 lb

## 2013-11-15 DIAGNOSIS — G4700 Insomnia, unspecified: Secondary | ICD-10-CM

## 2013-11-15 DIAGNOSIS — G8929 Other chronic pain: Secondary | ICD-10-CM

## 2013-11-15 DIAGNOSIS — G030 Nonpyogenic meningitis: Secondary | ICD-10-CM

## 2013-11-15 DIAGNOSIS — A879 Viral meningitis, unspecified: Secondary | ICD-10-CM

## 2013-11-15 DIAGNOSIS — I10 Essential (primary) hypertension: Secondary | ICD-10-CM

## 2013-11-15 DIAGNOSIS — N183 Chronic kidney disease, stage 3 unspecified: Secondary | ICD-10-CM

## 2013-11-15 DIAGNOSIS — R569 Unspecified convulsions: Secondary | ICD-10-CM

## 2013-11-15 DIAGNOSIS — F445 Conversion disorder with seizures or convulsions: Secondary | ICD-10-CM | POA: Insufficient documentation

## 2013-11-15 DIAGNOSIS — M79606 Pain in leg, unspecified: Secondary | ICD-10-CM

## 2013-11-15 DIAGNOSIS — M79609 Pain in unspecified limb: Secondary | ICD-10-CM

## 2013-11-15 NOTE — Telephone Encounter (Signed)
Rx given to patient at appt today.

## 2013-11-15 NOTE — Assessment & Plan Note (Signed)
Stable on PRN acei/hctz or lasix.

## 2013-11-15 NOTE — Assessment & Plan Note (Signed)
Pt states xanax has lost effect to help with sleep. Discussed possible risks of tolerance to benzos. rec against change or increase benzo dose. Pt asks for ambien, rec against this 2/2 h/o falls and unsteadiness. Offered trazodone - pt declines stating she is allergic to all mood medications.  Offered melatonin OTC - pt declines "I don't want to take any hormones"

## 2013-11-15 NOTE — Assessment & Plan Note (Addendum)
Recheck renal panel when pt returns tomorrow fasting. If persistently ckd stage 4, consider referral to renal - discussed with patient.

## 2013-11-15 NOTE — Progress Notes (Addendum)
BP 130/88  Pulse 96  Temp(Src) 98 F (36.7 C) (Oral)  Wt 123 lb 8 oz (56.019 kg)  SpO2 97%   CC: worsening leg pains, seizures?  Subjective:    Patient ID: [name] [name], female    DOB: Apr 26, 1946, 68 y.o.   MRN: [id number]  HPI: [name] [name] is a 68 y.o. female presenting on 11/15/2013 for Severe pain in legs and feet   Worsening leg and foot pain - h/o chronic leg and back pain from osteoarthritis and chronic nonbacterial arachnoiditis, h/o peripheral neuropathy.  On high dose methadone for this. Also has overtaken gabapentin in past. Last visit requip was started for possible RLS. States this made her feel funny and didn't help so she stopped it.  Lab Results  Component Value Date   CREATININE 2.11* 10/19/2013    Endorses increase in seizures, pt and husband endorsing having 1 seizure in office prior to my entering. This was not witnessed by my staff or myself. States they last 1-2 minutes. Endorses LOC when seizure happens. Feels sleepy after seizure. No tongue biting (does not have teeth). No bowel/bladder accidents with seizures. One seizure has occurred while she was sleeping.  No recent missed xanax.   Denies headaches, chest pain, fevers/chills, palpitations, vision changes or double vision.  Tripped and fell into refrigerator the other night, doesn't think hit head.  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  MRI HEAD WITH CONTRAST  Technique: Multiplanar, multiecho pulse sequences of the brain and  surrounding structures were obtained according to standard protocol  with intravenous contrast  Contrast: 46mL MULTIHANCE GADOBENATE DIMEGLUMINE 529 MG/ML IV SOLN  half dose contrast was given due to renal insufficiency    Comparison: CT 07/26/2012  Findings: Image quality degraded by motion.  Generalized atrophy. 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 Mild chronic microvascular  ischemia in the white matter and pons. Negative for acute infarct.  Postcontrast imaging of the brain reveals no enhancing mass lesion.  No intracranial hemorrhage.  Pituitary protocol was performed with dynamic scanning through the  pituitary. Unfortunately, the patient was moving during these  images degrading image quality. There is a possible 5 mm  microadenoma in the right pituitary gland which shows decreased  enhancement compared to expected pituitary tissue. Infundibulum is  midline. No compression of the optic chiasm. Cavernous sinus is  normal.  IMPRESSION:  Atrophy and chronic microvascular ischemia. No acute infarct.  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 Image quality is suboptimal due to patient  motion.  Original Report Authenticated By: Carl Best, M.D.   Relevant past medical, surgical, family and social history reviewed and updated as indicated.  Allergies and medications reviewed and updated. Current Outpatient Prescriptions on File Prior to Visit  Medication Sig  . albuterol (PROVENTIL HFA;VENTOLIN HFA) 108 (90 BASE) MCG/ACT inhaler Inhale 2 puffs into the lungs every 6 (six) hours as needed for wheezing.  Marland Kitchen ALPRAZolam (XANAX) 1 MG tablet TAKE ONE (1) TABLET FOUR TIMES EACH DAY AS NEEDED  . aspirin 81 MG chewable tablet Chew 1 tablet (81 mg total) by mouth daily.  . Calcium Carb-Cholecalciferol (CALCIUM-VITAMIN D) 600-400 MG-UNIT TABS  Take 1 tablet by mouth 2 (two) times daily.  . furosemide (LASIX) 20 MG tablet Take 1 tablet (20 mg total) by mouth daily as needed (swelling).  . gabapentin (NEURONTIN) 300 MG capsule Take by mouth See admin instructions. 600 mg at lunch and 900 mg at bedtime  . lisinopril-hydrochlorothiazide  (PRINZIDE,ZESTORETIC) 10-12.5 MG per tablet Take 1 tablet by mouth daily.  . methadone (DOLOPHINE) 10 MG tablet TAKE 3 TO 4 TABLETS EVERY 8 HOURS  . Multiple Vitamin (MULTIVITAMIN WITH MINERALS) TABS Take 1 tablet by mouth daily.  . nitroGLYCERIN (NITROSTAT) 0.4 MG SL tablet Place 1 tablet (0.4 mg total) under the tongue every 5 (five) minutes as needed for chest pain.  . [DISCONTINUED] diphenhydrAMINE (BENADRYL) 25 MG tablet Take 25 mg by mouth at bedtime as needed. For allergy symptoms   No current facility-administered medications on file prior to visit.    Review of Systems Per HPI unless specifically indicated above    Objective:    BP 130/88  Pulse 96  Temp(Src) 98 F (36.7 C) (Oral)  Wt 123 lb 8 oz (56.019 kg)  SpO2 97%  Physical Exam  Vitals reviewed. Constitutional: She is oriented to person, place, and time. She appears well-developed and well-nourished. No distress.  Endorses severe pain but remains NAD without grimace  HENT:  Mouth/Throat: Oropharynx is clear and moist. No oropharyngeal exudate.  Cardiovascular: Normal rate, regular rhythm, normal heart sounds and intact distal pulses.   No murmur heard. Pulmonary/Chest: Effort normal and breath sounds normal. No respiratory distress. She has no wheezes. She has no rales.  Musculoskeletal: She exhibits no edema.  Neurological: She is alert and oriented to person, place, and time. She has normal strength. No cranial nerve deficit or sensory deficit. She displays a negative Romberg sign. Coordination normal.  CN 2-12 intact Intact FTN Walks with cane  Skin: Skin is warm and dry. No rash noted.   Seizure episode witnessed in office - sudden closing of eyes, rigidity of body with flexion of arms and hips and legs associated with repetitive to-and-from motion but not tonic-clonic movement, vocalization throughout ictal state, slid onto floor, quickly woke up without post-ictal state and able to sit up. No bowel/bladder  incontinence, no tongue biting. No pain after episode other than knees and where husband held onto her wrists  Results for orders placed in visit on 10/19/13  FERRITIN      Result Value Ref Range   Ferritin 96  10 - 291 ng/mL  IBC PANEL      Result Value Ref Range   UIBC 217  125 - 400 ug/dL   TIBC 332  250 - 470 ug/dL   %SAT 35  20 - 55 %  RENAL FUNCTION PANEL      Result Value Ref Range   Sodium 141  135 - 145 mEq/L   Potassium 4.8  3.5 - 5.3 mEq/L   Chloride 107  96 - 112 mEq/L   CO2 24  19 - 32 mEq/L   Glucose, Bld 97  70 - 99 mg/dL   BUN 30 (*) 6 - 23 mg/dL   Creat 2.11 (*) 0.50 - 1.10 mg/dL   Albumin 4.1  3.5 - 5.2 g/dL   Calcium 10.2  8.4 - 10.5 mg/dL   Phosphorus 4.7 (*) 2.3 - 4.6 mg/dL  CK      Result Value Ref Range   Total CK 43  7 - 177 U/L  IRON      Result  Value Ref Range   Iron 115  42 - 145 ug/dL      Assessment & Plan:   Problem List Items Addressed This Visit   Pseudoseizure - Primary     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 Discussed this with patient as well as common etiologies of pseudoseizures. Pt endorses increased stress but denies feeling overwhelmed. Will also check blood work to r/o reversible causes of pseudo or epileptic seizure. Pt agrees with plan. If persistent seizure episodes, consider referral to neurology.    Relevant Orders      Lipid panel      TSH      Comprehensive metabolic panel      CBC with Differential   Nonbacterial arachnoiditis     Continue methadone.    Insomnia     Pt states xanax has lost effect to help with sleep. Discussed possible risks of tolerance to benzos. rec against change or increase benzo dose. Pt asks for ambien, rec against this 2/2 h/o falls and unsteadiness. Offered trazodone - pt declines stating she is allergic to all mood medications.  Offered melatonin OTC - pt declines "I don't want to take any hormones"    Hypertension     Stable on PRN acei/hctz or lasix.      CKD (chronic kidney disease)     Recheck renal panel when pt returns tomorrow fasting. If persistently ckd stage 4, consider referral to renal - discussed with patient.    Relevant Orders      Comprehensive metabolic panel      CBC with Differential   Chronic leg pain     OA and arachnoiditis related. No changes today.        Follow up plan: Return in about 1 month (around 12/15/2013), or as needed, for follow up visit.

## 2013-11-15 NOTE — Assessment & Plan Note (Signed)
OA and arachnoiditis related. No changes today.

## 2013-11-15 NOTE — Assessment & Plan Note (Signed)
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 Discussed this with patient as well as common etiologies of pseudoseizures. Pt endorses increased stress but denies feeling overwhelmed. Will also check blood work to r/o reversible causes of pseudo or epileptic seizure. Pt agrees with plan. If persistent seizure episodes, consider referral to neurology.

## 2013-11-15 NOTE — Assessment & Plan Note (Signed)
Continue methadone 

## 2013-11-15 NOTE — Telephone Encounter (Signed)
Printed and placed in Kim's box. 

## 2013-11-15 NOTE — Progress Notes (Signed)
Pre visit review using our clinic review tool, if applicable. No additional management support is needed unless otherwise documented below in the visit note. 

## 2013-11-15 NOTE — Patient Instructions (Signed)
Return Monday for fasting labwork to check kidneys again as well as cholesterol levels. We will check labwork for recent increase in seizures Good to see you today, call us with questions.  Nonepileptic Seizures Nonepileptic seizures are seizures that are not caused by abnormal electrical signals in your brain. These seizures often seem like epileptic seizures, but they are not caused by epilepsy.  There are two types of nonepileptic seizures:  A physiologic nonepileptic seizure results from a disruption in your brain.  A psychogenic seizure results from emotional stress. These seizures are sometimes called pseudoseizures. CAUSES  Causes of physiologic nonepileptic seizures include:   Sudden drop in blood pressure.  Low blood sugar.  Low levels of salt (sodium) in your blood.  Low levels of calcium in your blood.  Migraine.  Heart rhythm problems.  Sleep disorders.  Drug and alcohol abuse. Common causes of psychogenic nonepileptic seizures include:  Stress.  Emotional trauma.  Sexual or physical abuse.  Major life events, such as divorce or the death of a loved one.  Mental health disorders, including panic attack and hyperactivity disorder. SIGNS AND SYMPTOMS A nonepileptic seizure can look like an epileptic seizure, including uncontrollable shaking (convulsions), or changes in attention, behavior, or the ability to remain awake and alert. However, there are some differences. Nonepileptic seizures usually:  Do not cause physical injuries.  Start slowly.  Include crying or shrieking.  Last longer than 2 minutes.  Have a short recovery time without headache or exhaustion. DIAGNOSIS  Your health care provider can usually diagnose nonepileptic seizures after taking your medical history and giving you a physical exam. Your health care provider may want to talk to your friends or relatives who have seen you have a seizure.  You may also need to have tests to look for  causes of physiologic nonepileptic seizures. This may include an electroencephalogram (EEG), which is a test that measures electrical activity in your brain. If you have had an epileptic seizure, the results of your EEG will be abnormal. If your health care provider thinks you have had a psychogenic nonepileptic seizure, you may need to see a mental health specialist for an evaluation. TREATMENT  Treatment depends on the type and cause of your seizures.  For physiologic nonepileptic seizures, treatment is aimed at addressing the underlying condition that caused the seizures. These seizures usually stop when the underlying condition is properly treated.  Nonepileptic seizures do not respond to the seizure medicines used to treat epilepsy.  For psychogenic seizures, you may need to work with a mental health specialist. [place] care will depend on the type of nonepileptic seizures you have.   Follow all your health care provider's instructions.  Keep all your follow-up appointments. SEEK MEDICAL CARE IF: You continue to have seizures after treatment. SEEK IMMEDIATE MEDICAL CARE IF:  Your seizures change or become more frequent.  You injure yourself during a seizure.  You have one seizure after another.  You have trouble recovering from a seizure.  You have chest pain or trouble breathing. MAKE SURE YOU:  Understand these instructions.  Will watch your condition.  Will get help right away if you are not doing well or get worse. Document Released: 06/25/2005 Document Revised: 05/15/2013 Document Reviewed: 03/06/2013 [facility] Patient Information 2015 [place], [place]. This information is not intended to replace advice given to you by your health care provider. Make sure you discuss any questions you have with your health care provider.

## 2013-11-19 ENCOUNTER — Other Ambulatory Visit: Payer: Self-pay | Admitting: Family Medicine

## 2013-11-19 ENCOUNTER — Other Ambulatory Visit: Payer: Medicare Other

## 2013-11-28 ENCOUNTER — Other Ambulatory Visit: Payer: Medicare Other

## 2013-11-29 ENCOUNTER — Ambulatory Visit (INDEPENDENT_AMBULATORY_CARE_PROVIDER_SITE_OTHER)
Admission: RE | Admit: 2013-11-29 | Discharge: 2013-11-29 | Disposition: A | Discharge status: Home / Self Care | Payer: Medicare Other | Source: Ambulatory Visit | Attending: Family Medicine | Admitting: Family Medicine

## 2013-11-29 ENCOUNTER — Encounter: Payer: Self-pay | Admitting: Family Medicine

## 2013-11-29 ENCOUNTER — Ambulatory Visit (INDEPENDENT_AMBULATORY_CARE_PROVIDER_SITE_OTHER): Payer: Medicare Other | Admitting: Family Medicine

## 2013-11-29 VITALS — BP 132/70 | HR 62 | Temp 97.7°F | Wt 125.2 lb

## 2013-11-29 DIAGNOSIS — F445 Conversion disorder with seizures or convulsions: Secondary | ICD-10-CM

## 2013-11-29 DIAGNOSIS — S8991XA Unspecified injury of right lower leg, initial encounter: Secondary | ICD-10-CM

## 2013-11-29 DIAGNOSIS — S99919A Unspecified injury of unspecified ankle, initial encounter: Secondary | ICD-10-CM

## 2013-11-29 DIAGNOSIS — M7989 Other specified soft tissue disorders: Secondary | ICD-10-CM

## 2013-11-29 DIAGNOSIS — S99929A Unspecified injury of unspecified foot, initial encounter: Secondary | ICD-10-CM

## 2013-11-29 DIAGNOSIS — N183 Chronic kidney disease, stage 3 unspecified: Secondary | ICD-10-CM

## 2013-11-29 DIAGNOSIS — S8990XA Unspecified injury of unspecified lower leg, initial encounter: Secondary | ICD-10-CM

## 2013-11-29 DIAGNOSIS — R569 Unspecified convulsions: Secondary | ICD-10-CM

## 2013-11-29 DIAGNOSIS — E785 Hyperlipidemia, unspecified: Secondary | ICD-10-CM

## 2013-11-29 NOTE — Progress Notes (Signed)
Pre visit review using our clinic review tool, if applicable. No additional management support is needed unless otherwise documented below in the visit note. 

## 2013-11-29 NOTE — Assessment & Plan Note (Signed)
Recheck renal panel today - pt states has self stopped gabapentin without significant increase in nerve pain.

## 2013-11-29 NOTE — Progress Notes (Signed)
BP 132/70  Pulse 62  Temp(Src) 97.7 F (36.5 C) (Oral)  Wt 125 lb 4 oz (56.813 kg)  SpO2 94%   CC: R leg pain  Subjective:    Patient ID: [name] [name], female    DOB: 1946/02/11, 68 y.o.   MRN: [id number]  HPI: [name] [name] is a 68 y.o. female presenting on 11/29/2013 for Pt fell 2 weeks ago, R knee swollen and painful   Fell down trailer stairs 2 wks ago.  Bumped forehead, R elbow, L shoulder and R leg.  Most areas are doing better, but wanted R leg checked. Swelling and bruise R mid lateral leg. Some R knee pain with instability when on her feet.  No locking at the knee. Has been treating with ice pack, elevating leg, heating pad.  Decreasing pseudoseizures recently. Drank unsweet tea 4 hours ago. Had not had f/u labwork done yet - will obtain today.  Relevant past medical, surgical, family and social history reviewed and updated as indicated.  Allergies and medications reviewed and updated. Current Outpatient Prescriptions on File Prior to Visit  Medication Sig  . albuterol (PROVENTIL HFA;VENTOLIN HFA) 108 (90 BASE) MCG/ACT inhaler Inhale 2 puffs into the lungs every 6 (six) hours as needed for wheezing.  Marland Kitchen ALPRAZolam (XANAX) 1 MG tablet TAKE ONE (1) TABLET FOUR TIMES EACH DAY AS NEEDED  . aspirin 81 MG chewable tablet Chew 1 tablet (81 mg total) by mouth daily.  . Calcium Carb-Cholecalciferol (CALCIUM-VITAMIN D) 600-400 MG-UNIT TABS Take 1 tablet by mouth 2 (two) times daily.  . furosemide (LASIX) 20 MG tablet Take 1 tablet (20 mg total) by mouth daily as needed (swelling).  Marland Kitchen lisinopril-hydrochlorothiazide (PRINZIDE,ZESTORETIC) 10-12.5 MG per tablet Take 1 tablet by mouth daily.  . methadone (DOLOPHINE) 10 MG tablet TAKE 3 TO 4 TABLETS EVERY 8 HOURS  . Multiple Vitamin (MULTIVITAMIN WITH MINERALS) TABS Take 1 tablet by mouth daily.  . nitroGLYCERIN (NITROSTAT) 0.4 MG SL tablet Place 1 tablet (0.4 mg total) under the tongue every 5 (five) minutes as needed for chest pain.  Marland Kitchen  gabapentin (NEURONTIN) 300 MG capsule Take 300 mg by mouth 2 (two) times daily as needed.   . [DISCONTINUED] diphenhydrAMINE (BENADRYL) 25 MG tablet Take 25 mg by mouth at bedtime as needed. For allergy symptoms   No current facility-administered medications on file prior to visit.    Review of Systems Per HPI unless specifically indicated above    Objective:    BP 132/70  Pulse 62  Temp(Src) 97.7 F (36.5 C) (Oral)  Wt 125 lb 4 oz (56.813 kg)  SpO2 94%  Physical Exam  Nursing note and vitals reviewed. Constitutional: She appears well-developed and well-nourished. No distress.  HENT:  Head: Normocephalic and atraumatic.  Mouth/Throat: Oropharynx is clear and moist. No oropharyngeal exudate.  Resolving ecchymosis L forehead  Cardiovascular: Regular rhythm.   Musculoskeletal: She exhibits edema.  R calf circ 35cm L calf circ 32.5cm R lateral leg distal to head of fibula with large ecchymosis, tenderness and swelling present 2+ DP bilaterally   Results for orders placed in visit on 10/19/13  FERRITIN      Result Value Ref Range   Ferritin 96  10 - 291 ng/mL  IBC PANEL      Result Value Ref Range   UIBC 217  125 - 400 ug/dL   TIBC 332  250 - 470 ug/dL   %SAT 35  20 - 55 %  RENAL FUNCTION PANEL  Result Value Ref Range   Sodium 141  135 - 145 mEq/L   Potassium 4.8  3.5 - 5.3 mEq/L   Chloride 107  96 - 112 mEq/L   CO2 24  19 - 32 mEq/L   Glucose, Bld 97  70 - 99 mg/dL   BUN 30 (*) 6 - 23 mg/dL   Creat 2.11 (*) 0.50 - 1.10 mg/dL   Albumin 4.1  3.5 - 5.2 g/dL   Calcium 10.2  8.4 - 10.5 mg/dL   Phosphorus 4.7 (*) 2.3 - 4.6 mg/dL  CK      Result Value Ref Range   Total CK 43  7 - 177 U/L  IRON      Result Value Ref Range   Iron 115  42 - 145 ug/dL      Assessment & Plan:   Problem List Items Addressed This Visit   Pseudoseizure   Leg injury - Primary     R leg injury with bony tenderness to palpation distal to fibular head - will need xray today to r/o fib  fx. Xray clear on my read. Also with noted swelling and induration at ecchymosis - check d dimer to r/o post-injury DVT - if elevated will need leg ultrasound tomorrow. Discussed all with patient who agrees with plan.    Relevant Orders      DG Tibia/Fibula Right   CKD (chronic kidney disease)     Recheck renal panel today - pt states has self stopped gabapentin without significant increase in nerve pain.     Other Visit Diagnoses   Leg swelling        Relevant Orders       D-dimer, quantitative        Follow up plan: Return if symptoms worsen or fail to improve.

## 2013-11-29 NOTE — Patient Instructions (Signed)
Blood work and xray of right leg today D dimer today If elevated we will obtain ultrasound of right leg tomorrow to help rule out blood clot. Good job with weight gain!

## 2013-11-29 NOTE — Assessment & Plan Note (Addendum)
R leg injury with bony tenderness to palpation distal to fibular head - will need xray today to r/o fib fx. Xray clear on my read. Also with noted swelling and induration at ecchymosis - check d dimer to r/o post-injury DVT - if elevated will need leg ultrasound tomorrow. Discussed all with patient who agrees with plan.

## 2013-11-30 ENCOUNTER — Other Ambulatory Visit: Payer: Medicare Other

## 2013-11-30 ENCOUNTER — Telehealth: Payer: Self-pay | Admitting: *Deleted

## 2013-11-30 ENCOUNTER — Other Ambulatory Visit: Payer: Self-pay | Admitting: Family Medicine

## 2013-11-30 DIAGNOSIS — M7989 Other specified soft tissue disorders: Secondary | ICD-10-CM

## 2013-11-30 DIAGNOSIS — S8991XA Unspecified injury of right lower leg, initial encounter: Secondary | ICD-10-CM

## 2013-11-30 LAB — CBC WITH DIFFERENTIAL/PLATELET
BASOS PCT: 0.7 % (ref 0.0–3.0)
Basophils Absolute: 0 10*3/uL (ref 0.0–0.1)
EOS PCT: 8.1 % — AB (ref 0.0–5.0)
Eosinophils Absolute: 0.5 10*3/uL (ref 0.0–0.7)
HEMATOCRIT: 34.6 % — AB (ref 36.0–46.0)
Hemoglobin: 11.4 g/dL — ABNORMAL LOW (ref 12.0–15.0)
Lymphocytes Relative: 40 % (ref 12.0–46.0)
Lymphs Abs: 2.3 10*3/uL (ref 0.7–4.0)
MCHC: 32.8 g/dL (ref 30.0–36.0)
MCV: 92.1 fl (ref 78.0–100.0)
MONO ABS: 0.4 10*3/uL (ref 0.1–1.0)
MONOS PCT: 6.2 % (ref 3.0–12.0)
NEUTROS PCT: 45 % (ref 43.0–77.0)
Neutro Abs: 2.6 10*3/uL (ref 1.4–7.7)
Platelets: 189 10*3/uL (ref 150.0–400.0)
RBC: 3.76 Mil/uL — AB (ref 3.87–5.11)
RDW: 13.9 % (ref 11.5–15.5)
WBC: 5.7 10*3/uL (ref 4.0–10.5)

## 2013-11-30 LAB — LIPID PANEL
Cholesterol: 152 mg/dL (ref 0–200)
HDL: 40.1 mg/dL (ref >39.00)
LDL CALC: 65 mg/dL (ref 0–99)
NonHDL: 111.9
TRIGLYCERIDES: 235 mg/dL — AB (ref 0.0–149.0)
Total CHOL/HDL Ratio: 4
VLDL: 47 mg/dL — AB (ref 0.0–40.0)

## 2013-11-30 LAB — COMPREHENSIVE METABOLIC PANEL
ALBUMIN: 3.8 g/dL (ref 3.5–5.2)
ALT: 16 U/L (ref 0–35)
AST: 23 U/L (ref 0–37)
Alkaline Phosphatase: 155 U/L — ABNORMAL HIGH (ref 39–117)
BILIRUBIN TOTAL: 0.3 mg/dL (ref 0.2–1.2)
BUN: 37 mg/dL — AB (ref 6–23)
CO2: 29 mEq/L (ref 19–32)
Calcium: 10.2 mg/dL (ref 8.4–10.5)
Chloride: 103 mEq/L (ref 96–112)
Creatinine, Ser: 2.8 mg/dL — ABNORMAL HIGH (ref 0.4–1.2)
GFR: 18.21 mL/min — AB (ref >60.00)
GLUCOSE: 60 mg/dL — AB (ref 70–99)
Potassium: 4.6 mEq/L (ref 3.5–5.1)
Sodium: 138 mEq/L (ref 135–145)
Total Protein: 7.5 g/dL (ref 6.0–8.3)

## 2013-11-30 LAB — TSH: TSH: 0.58 u[IU]/mL (ref 0.35–4.50)

## 2013-11-30 LAB — D-DIMER, QUANTITATIVE: D-Dimer, Quant: 0.93 ug/mL-FEU — ABNORMAL HIGH (ref 0.00–0.48)

## 2013-11-30 NOTE — Telephone Encounter (Signed)
pt left v/m requesting cb about status of alprazolam refill.

## 2013-11-30 NOTE — Telephone Encounter (Signed)
Ok to refill 

## 2013-11-30 NOTE — Telephone Encounter (Signed)
Left detailed message on voicemail.  

## 2013-11-30 NOTE — Telephone Encounter (Signed)
[name] [name] at [facility] called and said patient was negative for DVT. I told her to let patient leave and we would call with her results.

## 2013-11-30 NOTE — Telephone Encounter (Signed)
plz notify ultrasound returned normal - negative for DVT. Recommend cool compresses to bruise on right leg - likely a hematoma that will take several weeks to fully resorb.  Try to keep legs elevated as much as able.

## 2013-11-30 NOTE — Telephone Encounter (Signed)
Rx called in as directed and message left advising patient.

## 2013-11-30 NOTE — Telephone Encounter (Signed)
plz phone in. 

## 2013-12-02 ENCOUNTER — Other Ambulatory Visit: Payer: Self-pay | Admitting: Family Medicine

## 2013-12-02 DIAGNOSIS — N184 Chronic kidney disease, stage 4 (severe): Secondary | ICD-10-CM

## 2013-12-02 MED ORDER — HYDROCHLOROTHIAZIDE 12.5 MG PO CAPS: 12.5000 mg | ORAL_CAPSULE | Freq: Every day | ORAL | Status: DC

## 2013-12-03 ENCOUNTER — Encounter: Payer: Self-pay | Admitting: Family Medicine

## 2013-12-03 ENCOUNTER — Other Ambulatory Visit: Payer: Self-pay | Admitting: Family Medicine

## 2013-12-13 ENCOUNTER — Other Ambulatory Visit: Payer: Self-pay

## 2013-12-14 MED ORDER — METHADONE HCL 10 MG PO TABS: ORAL_TABLET | ORAL | Status: DC

## 2013-12-14 NOTE — Telephone Encounter (Signed)
Rx left in front office for pick up and pt is aware  

## 2013-12-14 NOTE — Telephone Encounter (Signed)
Printed and placed in Kim's box. 

## 2013-12-20 ENCOUNTER — Ambulatory Visit: Payer: Medicare Other | Admitting: Family Medicine

## 2013-12-20 DIAGNOSIS — Z0289 Encounter for other administrative examinations: Secondary | ICD-10-CM

## 2013-12-26 ENCOUNTER — Other Ambulatory Visit: Payer: Self-pay | Admitting: Family Medicine

## 2013-12-26 NOTE — Telephone Encounter (Signed)
Ok to refill in Dr. Synthia Innocent absence? Last filled 11/30/13 #120 0RF

## 2013-12-27 ENCOUNTER — Other Ambulatory Visit: Payer: Self-pay | Admitting: Family Medicine

## 2013-12-27 NOTE — Telephone Encounter (Signed)
Rx called in as directed.   

## 2013-12-27 NOTE — Telephone Encounter (Signed)
Please call in.  Thanks.   

## 2014-01-01 ENCOUNTER — Ambulatory Visit (INDEPENDENT_AMBULATORY_CARE_PROVIDER_SITE_OTHER): Payer: Medicare Other | Admitting: Family Medicine

## 2014-01-01 ENCOUNTER — Encounter: Payer: Self-pay | Admitting: Family Medicine

## 2014-01-01 VITALS — BP 120/78 | HR 71 | Temp 97.9°F | Wt 124.5 lb

## 2014-01-01 DIAGNOSIS — R071 Chest pain on breathing: Secondary | ICD-10-CM

## 2014-01-01 DIAGNOSIS — R0789 Other chest pain: Secondary | ICD-10-CM

## 2014-01-01 MED ORDER — OXYCODONE HCL 5 MG PO TABS: 5.0000 mg | ORAL_TABLET | Freq: Two times a day (BID) | ORAL | Status: DC | PRN

## 2014-01-01 NOTE — Patient Instructions (Signed)
Take oxycodone as needed for pain, sedation caution. Take care.

## 2014-01-01 NOTE — Progress Notes (Signed)
Pre visit review using our clinic review tool, if applicable. No additional management support is needed unless otherwise documented below in the visit note.  New pain complaint.  She slipped and fell recently.  Was at home.  This was a few weeks ago.  Since then pain coughing. She hugs a pillow to cough.  Still with L sided thorax pain, midaxillary line.  No bruising now except near the L greater troch.  Prev bruising near the L axilla had resolved.  Pain with a deep breath.  No R sided pain.    Has renal fu pending.  Can't tolerate NSAIDS or tylenol.   Meds, vitals, and allergies reviewed.   ROS: See HPI.  Otherwise, noncontributory.  nad ncat rrr ctab abd soft L chest wall ttp w/o bruising at the midaxillary line, more pain with deep breath.  Chronically ill appearing but not in distress.  Not sedated, speech clear

## 2014-01-01 NOTE — Assessment & Plan Note (Signed)
Imaging likely not to change mgmt, even if rib rx.  D/w pt.  She agrees.  Limited pain med options.  Can use oxycodone with sedation caution, d/w pt.  She agrees.   Should slowly resolve.

## 2014-01-02 ENCOUNTER — Telehealth: Payer: Self-pay | Admitting: Family Medicine

## 2014-01-02 ENCOUNTER — Ambulatory Visit (INDEPENDENT_AMBULATORY_CARE_PROVIDER_SITE_OTHER)
Admission: RE | Admit: 2014-01-02 | Discharge: 2014-01-02 | Disposition: A | Discharge status: Home / Self Care | Payer: Medicare Other | Source: Ambulatory Visit | Attending: Family Medicine | Admitting: Family Medicine

## 2014-01-02 DIAGNOSIS — R0789 Other chest pain: Secondary | ICD-10-CM

## 2014-01-02 DIAGNOSIS — R071 Chest pain on breathing: Secondary | ICD-10-CM

## 2014-01-02 NOTE — Telephone Encounter (Signed)
Patient Information:  Caller Name: [name]  Phone: [phone]  Patient: [name] [name], [name] [name]  Gender: Female  DOB: [birth year]  Age: 67 Years  PCP: Ria Bush [facility])  Office Follow Up:  Does the office need to follow up with this patient?: Yes  Instructions For The Office: Patient requesting call back re: Rib Trauma and continued pain  RN Note:  Please call her back to let her know if Dr. Darnell Level is fine with her going to [facility] to be checked. She does not want to drive to [facility].  Symptoms  Reason For Call & Symptoms: Seen in the office on 01/01/14 for c/o pain in side and back after fell two weeks ago. She feels movement (like a balloon) in ribs and back everytime she walks and she is having trouble getting comfortable to sleep. Taking methadone at HS and Percocet 5mg s every 4 hours during the day and not helping with pain. She is wanting to go to [facility] to be checked and get Xrays and she will sign release and have them send copies to Dr. Leo Grosser.  Reviewed Health History In EMR: Yes  Reviewed Medications In EMR: Yes  Reviewed Allergies In EMR: Yes  Reviewed Surgeries / Procedures: Yes  Date of Onset of Symptoms: 12/19/2013  Treatments Tried: Percocet and Methadone  Treatments Tried Worked: No  Guideline(s) Used:  Back Injury  Disposition Per Guideline:   Go to Office Now  Reason For Disposition Reached:   Severe pain (e.g., excruciating, unable to do any normal activities)  Advice Given:  Avoid:  Avoid heavy lifting and any sports activities for the first week after an injury.  Call Back If:  Severe pain lasts over 2 hours after pain medicine and ice  Swelling or bruise becomes over 5 inches (over size of palm).  Pain not improving after 3 days  Pain or swelling lasts over 7 days  You become worse.  Patient Refused Recommendation:  Patient Will Go To ED  [facility]

## 2014-01-02 NOTE — Telephone Encounter (Signed)
Would offer her option of coming into office today for chest xray rather than going to ER. We would call her with results.

## 2014-01-02 NOTE — Telephone Encounter (Signed)
Patient notified and will come today for xray.

## 2014-01-02 NOTE — Telephone Encounter (Signed)
Relevant patient education mailed to patient.  

## 2014-01-03 ENCOUNTER — Ambulatory Visit: Payer: Medicare Other | Admitting: Family Medicine

## 2014-01-04 LAB — CBC: HEMOGLOBIN: 11.8 g/dL

## 2014-01-04 LAB — COMPREHENSIVE METABOLIC PANEL
Albumin: 4.1
Creat: 1.49
GFR, Est Non African American: 36
PHOSPHORUS: 3.3 mg/dL (ref 2.5–4.9)
Potassium: 4.9 mmol/L
Sodium: 139 mmol/L (ref 137–147)

## 2014-01-04 LAB — PTH, INTACT: PTH INTERP: 80

## 2014-01-04 LAB — VITAMIN D 25 HYDROXY (VIT D DEFICIENCY, FRACTURES): Vit D, 25-Hydroxy: 17.6

## 2014-01-10 ENCOUNTER — Ambulatory Visit (INDEPENDENT_AMBULATORY_CARE_PROVIDER_SITE_OTHER): Payer: Medicare Other | Admitting: Family Medicine

## 2014-01-10 ENCOUNTER — Encounter: Payer: Self-pay | Admitting: Family Medicine

## 2014-01-10 VITALS — BP 150/100 | HR 88 | Temp 98.9°F | Wt 122.0 lb

## 2014-01-10 DIAGNOSIS — N183 Chronic kidney disease, stage 3 unspecified: Secondary | ICD-10-CM

## 2014-01-10 DIAGNOSIS — G894 Chronic pain syndrome: Secondary | ICD-10-CM

## 2014-01-10 MED ORDER — METHADONE HCL 10 MG PO TABS: 30.0000 mg | ORAL_TABLET | Freq: Three times a day (TID) | ORAL | Status: DC

## 2014-01-10 MED ORDER — METHADONE HCL 10 MG PO TABS: ORAL_TABLET | ORAL | Status: DC

## 2014-01-10 NOTE — Assessment & Plan Note (Signed)
Ran out of methadone early this month 2/2 family stressors and increased pain from caring for husband undergoing hep C treatment currently. Again reviewed use of methadone only as prescribed. Has not recently ran out of pain med in past. Will fill #99 methadone 10mg  tabs to take 30mg  tid for next 10 days then return to regular dose of #330 per month. Pt agrees.

## 2014-01-10 NOTE — Progress Notes (Signed)
Pre visit review using our clinic review tool, if applicable. No additional management support is needed unless otherwise documented below in the visit note. 

## 2014-01-10 NOTE — Assessment & Plan Note (Signed)
Reviewed renal note and appreciate Dr [facility] care of patient. Fortunately, latest renal panel improved with Cr 1.5 = GFR 36.  Discussed this with patient, emphasized need to stay well hydrated and avoid NSAIDs to prevent future injury or acute worsening of chronic kidney disease. Currently not needing dialysis acutely. rec continued f/u with Dr. Juleen China.
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
## 2014-01-10 NOTE — Patient Instructions (Signed)
Good to see you today. The good news is that your kidney function did improve at the last office visit so I don't think we need to worry about dialysis for the time being. Continue staying hydrated, avoid anti inflammatories like ibuprofen and aleve. May do short course of methadone to last until first of next month. Only take methadone as prescribed.

## 2014-01-11 ENCOUNTER — Encounter: Payer: Self-pay | Admitting: *Deleted

## 2014-01-16 ENCOUNTER — Other Ambulatory Visit: Payer: Self-pay | Admitting: Family Medicine

## 2014-01-17 ENCOUNTER — Other Ambulatory Visit: Payer: Self-pay | Admitting: Family Medicine

## 2014-01-17 NOTE — Telephone Encounter (Signed)
Hard copy also printed at last OV. Confirmed with Dr. Darnell Level. Reminded patient of same. She will check with pharmacy to see if they have it on file.

## 2014-01-17 NOTE — Telephone Encounter (Signed)
printed and placed in Kims' box. 

## 2014-01-17 NOTE — Telephone Encounter (Signed)
New printed Rx shredded.

## 2014-01-22 ENCOUNTER — Ambulatory Visit (INDEPENDENT_AMBULATORY_CARE_PROVIDER_SITE_OTHER): Payer: Medicare Other | Admitting: Family Medicine

## 2014-01-22 ENCOUNTER — Encounter: Payer: Self-pay | Admitting: Family Medicine

## 2014-01-22 ENCOUNTER — Encounter: Payer: Self-pay | Admitting: Radiology

## 2014-01-22 VITALS — BP 124/70 | HR 80 | Temp 98.5°F | Wt 125.0 lb

## 2014-01-22 DIAGNOSIS — M170 Bilateral primary osteoarthritis of knee: Secondary | ICD-10-CM

## 2014-01-22 DIAGNOSIS — S2341XA Sprain of ribs, initial encounter: Secondary | ICD-10-CM

## 2014-01-22 DIAGNOSIS — R892 Abnormal level of other drugs, medicaments and biological substances in specimens from other organs, systems and tissues: Secondary | ICD-10-CM

## 2014-01-22 DIAGNOSIS — M79606 Pain in leg, unspecified: Principal | ICD-10-CM

## 2014-01-22 DIAGNOSIS — G8929 Other chronic pain: Secondary | ICD-10-CM

## 2014-01-22 DIAGNOSIS — Z5189 Encounter for other specified aftercare: Secondary | ICD-10-CM

## 2014-01-22 DIAGNOSIS — IMO0002 Reserved for concepts with insufficient information to code with codable children: Secondary | ICD-10-CM | POA: Insufficient documentation

## 2014-01-22 DIAGNOSIS — S29019D Strain of muscle and tendon of unspecified wall of thorax, subsequent encounter: Secondary | ICD-10-CM

## 2014-01-22 DIAGNOSIS — S2341XD Sprain of ribs, subsequent encounter: Secondary | ICD-10-CM

## 2014-01-22 DIAGNOSIS — M79609 Pain in unspecified limb: Secondary | ICD-10-CM

## 2014-01-22 DIAGNOSIS — M1710 Unilateral primary osteoarthritis, unspecified knee: Secondary | ICD-10-CM

## 2014-01-22 HISTORY — DX: Abnormal level of other drugs, medicaments and biological substances in specimens from other organs, systems and tissues: R89.2

## 2014-01-22 MED ORDER — METAXALONE 400 MG PO TABS: 400.0000 mg | ORAL_TABLET | Freq: Three times a day (TID) | ORAL | Status: DC | PRN
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
## 2014-01-22 NOTE — Progress Notes (Signed)
Pre visit review using our clinic review tool, if applicable. No additional management support is needed unless otherwise documented below in the visit note. 

## 2014-01-22 NOTE — Assessment & Plan Note (Signed)
Discussed dx - reviewed xray and rib series which were overall normal. Discussed may try skelaxin - but discussed sedation precautions and increased fall risk. Anticipate continued slow healing.

## 2014-01-22 NOTE — Patient Instructions (Signed)
For knee - save cartilage by minimizing transfers. Start using knee sleeve brace on right knee for added support. I've written a prescription for a rollating walker with seat. For back - I think we have rib bruise and rib muscle strain - may try low dose skelaxin. continu emethadone. Good to see you today.

## 2014-01-22 NOTE — Assessment & Plan Note (Signed)
Discussed end stage osteoarthritis of bilateral knees R>L - discussed brace use - recommended start flexible knee brace. Want to avoid NSAIDs 2/2 high degree renal insufficiency.

## 2014-01-22 NOTE — Assessment & Plan Note (Signed)
See above

## 2014-01-23 ENCOUNTER — Telehealth: Payer: Self-pay | Admitting: *Deleted

## 2014-01-23 ENCOUNTER — Other Ambulatory Visit: Payer: Self-pay | Admitting: Family Medicine

## 2014-01-23 NOTE — Telephone Encounter (Signed)
Filled and placed in Kim's box. 

## 2014-01-23 NOTE — Telephone Encounter (Signed)
PA faxed. Will await determination. 

## 2014-01-23 NOTE — Telephone Encounter (Signed)
PA for skelaxin in your IN box for completion

## 2014-01-23 NOTE — Telephone Encounter (Signed)
Received refill request electronically. Last refill 12/27/13 #120, last office visit 01/22/14. Is it okay to refill medication?

## 2014-01-24 MED ORDER — TIZANIDINE HCL 2 MG PO TABS: 2.0000 mg | ORAL_TABLET | Freq: Two times a day (BID) | ORAL | Status: DC | PRN

## 2014-01-24 NOTE — Telephone Encounter (Signed)
Message left notifying patient.

## 2014-01-24 NOTE — Telephone Encounter (Signed)
PA denied. States tizanidine must be tried and failed. In your IN box for review.

## 2014-01-24 NOTE — Addendum Note (Signed)
Addended by: Ria Bush on: 01/24/2014 01:55 PM   Modules accepted: Orders, Medications

## 2014-01-24 NOTE — Telephone Encounter (Signed)
plz phone in. 

## 2014-01-24 NOTE — Telephone Encounter (Signed)
Rx called in as directed.   

## 2014-01-24 NOTE — Telephone Encounter (Addendum)
tizanadine (zanaflex) sent in. Would have her try this instead.

## 2014-01-31 ENCOUNTER — Telehealth: Payer: Self-pay | Admitting: Family Medicine

## 2014-01-31 ENCOUNTER — Encounter: Payer: Self-pay | Admitting: Family Medicine

## 2014-01-31 NOTE — Telephone Encounter (Signed)
plz notify urine drug screen returned inappropriately positive for several different benzodiazepines in addition to xanax as well as elavil (amitritpyline) antidepressant. This puts her in increased risk bracket and she will need to have more frequent drug testing. plz remind to only taking medications prescribed. Will further discuss at next appointment.

## 2014-01-31 NOTE — Telephone Encounter (Signed)
I don't feel comfortable discussing these types of results with a patient. Would you mind talking with her?

## 2014-02-12 ENCOUNTER — Telehealth: Payer: Self-pay

## 2014-02-12 ENCOUNTER — Telehealth: Payer: Self-pay | Admitting: Family Medicine

## 2014-02-12 NOTE — Telephone Encounter (Signed)
Left message on answering machine to call back.

## 2014-02-12 NOTE — Telephone Encounter (Signed)
Called pt lmovm stating per Medicare, yearly CPX is needed.

## 2014-02-12 NOTE — Telephone Encounter (Signed)
If does not go to ER, recommend eval in office at my next available appt.

## 2014-02-12 NOTE — Telephone Encounter (Signed)
Patient Information:  Caller Name: Takiesha  Phone: [phone]  Patient: [name], [name]  Gender: Female  DOB: Sep 27, 1945  Age: 68 Years  PCP: Ria Bush [facility])  Office Follow Up:  Does the office need to follow up with this patient?: Yes  Instructions For The Office: Please review.  Is there anything that can be done about her copay so she could at least be seen in office?  Refuses to go to ER. Please review and contact patient as soon as possible at  [phone].   Symptoms  Reason For Call & Symptoms: Knelt on floor to feed cats this am 9/22 about 6:30 am, slipped and fell forward hitting head/front and top on floor hard.  No LOC.  Has developed severe headache that has not responded to 2 doses of Aspirin. Patient says will not go to ER, can't afford the $10.00 copay to come to office due to time of month, running out of money even for food.  Reviewed Health History In EMR: Yes  Reviewed Medications In EMR: Yes  Reviewed Allergies In EMR: Yes  Reviewed Surgeries / Procedures: Yes  Date of Onset of Symptoms: 02/12/2014  Treatments Tried: Aspirin 2 tabs then repeated 2 tabs again  Treatments Tried Worked: No  Guideline(s) Used:  Head Injury  Disposition Per Guideline:   Go to ED Now (or to Office with PCP Approval)  Reason For Disposition Reached:   Severe headache  Advice Given:  Apply a Cold Pack:  Apply a cold pack or an ice bag (wrapped in a moist towel) to the area for 20 minutes. Repeat in 1 hour, then every 4 hours while awake.  Continue this for the first 48 hours after an injury.  This will help decrease pain and swelling.  Observation After Head Injury:  Wake the head-injured person every 4 hours for the first 24 hours. Check to see if he (or she) can walk and talk.  Call Back If:  You become worse.  Patient Refused Recommendation:  Patient Refused Care Advice  Says will not go to ER.  Says does not have the $10.00 copay to come to office to be  checked.   Can anything be done for this, anything to help patient since refusing OV due to cost.

## 2014-02-12 NOTE — Telephone Encounter (Signed)
plz notify I recommend come in right now for evaluation. If unable to come in by 1pm then recommend ER for severe headache.

## 2014-02-12 NOTE — Telephone Encounter (Signed)
Patient notified as instructed by telephone. Patient stated that her husband is not at home and will not be back until after 1:00. Patient refused to go to the ER. Tried to convince patient to have her husband take her to the ER when he returns. Patient stated that she will discuss this with her husband and if she decides to go she will call back and if she does not call back that means she did not go. Advised patient that something serious could be going on and Dr. Danise Mina really wants her to be checked out.

## 2014-02-15 ENCOUNTER — Telehealth: Payer: Self-pay

## 2014-02-15 NOTE — Telephone Encounter (Signed)
Pt called back and pt is not having burning, pain or frequency of urine and no back pain; pt was advised by kidney specialist that she had a UTI and kidney failure. Pt will schedule appt with Dr Darnell Level; does not want to see another provider. Pt scheduled appt on 02/18/14 at 2PM(appt time was pts request).advised pt if condition changes or worsens prior to appt to cb. Pt voiced understanding.

## 2014-02-15 NOTE — Telephone Encounter (Signed)
Pt left v/m; pt was seen by urine specialist and was told had UTI; pt thinks specialist will send information to Dr Danise Mina and Dr Danise Mina will prescribe antibiotic for UTI. [place]. Unable to reach pt by phone to confirm her v/m.Please advise.

## 2014-02-15 NOTE — Telephone Encounter (Signed)
Will discuss at appt on Monday.

## 2014-02-15 NOTE — Telephone Encounter (Signed)
abx should have been started by kidney specialist if they thought she had UTI. Recommend she schedule appt here for evaluation if having UTI sxs.

## 2014-02-18 ENCOUNTER — Other Ambulatory Visit: Payer: Self-pay | Admitting: Family Medicine

## 2014-02-18 ENCOUNTER — Encounter: Payer: Self-pay | Admitting: Family Medicine

## 2014-02-18 ENCOUNTER — Ambulatory Visit (INDEPENDENT_AMBULATORY_CARE_PROVIDER_SITE_OTHER): Payer: Medicare Other | Admitting: Family Medicine

## 2014-02-18 VITALS — BP 126/74 | HR 95 | Temp 98.8°F | Resp 16 | Wt 122.8 lb

## 2014-02-18 DIAGNOSIS — N3000 Acute cystitis without hematuria: Secondary | ICD-10-CM

## 2014-02-18 DIAGNOSIS — Z23 Encounter for immunization: Secondary | ICD-10-CM

## 2014-02-18 DIAGNOSIS — R892 Abnormal level of other drugs, medicaments and biological substances in specimens from other organs, systems and tissues: Secondary | ICD-10-CM

## 2014-02-18 DIAGNOSIS — N390 Urinary tract infection, site not specified: Secondary | ICD-10-CM | POA: Insufficient documentation

## 2014-02-18 MED ORDER — CIPROFLOXACIN HCL 250 MG PO TABS: 250.0000 mg | ORAL_TABLET | Freq: Two times a day (BID) | ORAL | Status: DC

## 2014-02-18 MED ORDER — GABAPENTIN 100 MG PO CAPS: 300.0000 mg | ORAL_CAPSULE | Freq: Two times a day (BID) | ORAL | Status: AC

## 2014-02-18 MED ORDER — ALPRAZOLAM 1 MG PO TABS: ORAL_TABLET | ORAL | Status: DC

## 2014-02-18 MED ORDER — METHADONE HCL 10 MG PO TABS: ORAL_TABLET | ORAL | Status: DC

## 2014-02-18 NOTE — Patient Instructions (Addendum)
Start cipro 250mg  twice daily for 5 days for urine infection.  Give [place] urine sample to send culture. Flu shot today. Methadone and xanax refilled today.

## 2014-02-18 NOTE — Assessment & Plan Note (Addendum)
sxs and UA consistent with UTI - start cipro 250mg  bid x 5 days. UCx sent today. Renally dosed cipro. Advised update if sxs persist or worsen.

## 2014-02-18 NOTE — Progress Notes (Signed)
Pre visit review using our clinic review tool, if applicable. No additional management support is needed unless otherwise documented below in the visit note. 

## 2014-02-18 NOTE — Progress Notes (Signed)
BP 126/74  Pulse 95  Temp(Src) 98.8 F (37.1 C) (Oral)  Resp 16  Wt 122 lb 12 oz (55.679 kg)  SpO2 97%   CC: UTI?  Subjective:    Patient ID: [name] [name], female    DOB: [birth year], 68 y.o.   MRN: [id number]  HPI: [name] [name] is a 68 y.o. female presenting on 02/18/2014 for Dysuria   Seen by nephrologist last week, I have records of UA showing 2+ LE with >30 WBC, cloudy appearance, and 0-2 RBC. Cr not checked. UCx not sent. Last week pt did not have sxs, but over weekend started having dysuria, urgency, incomplete emptying. No fevers/chills, nausea/vomiting, hematuria. Lab Results  Component Value Date   CREATININE 1.49 01/04/2014    Reviewed recently abnormal UDS - inappropriately positive for lorazepam, temazepam, oxazepam, as well as amitriptyline.   Wt Readings from Last 3 Encounters:  02/18/14 122 lb 12 oz (55.679 kg)  01/22/14 125 lb (56.7 kg)  01/10/14 122 lb (55.339 kg)    Relevant past medical, surgical, family and social history reviewed and updated as indicated.  Allergies and medications reviewed and updated. Current Outpatient Prescriptions on File Prior to Visit  Medication Sig  . albuterol (PROVENTIL HFA;VENTOLIN HFA) 108 (90 BASE) MCG/ACT inhaler Inhale 2 puffs into the lungs every 6 (six) hours as needed for wheezing.  Marland Kitchen aspirin 81 MG chewable tablet Chew 81 mg by mouth 2 (two) times daily.  . Calcium Carb-Cholecalciferol (CALCIUM-VITAMIN D) 600-400 MG-UNIT TABS Take 1 tablet by mouth 2 (two) times daily.  . furosemide (LASIX) 20 MG tablet Take 1 tablet (20 mg total) by mouth daily as needed (swelling).  . Multiple Vitamin (MULTIVITAMIN WITH MINERALS) TABS Take 1 tablet by mouth daily.  . nitroGLYCERIN (NITROSTAT) 0.4 MG SL tablet Place 1 tablet (0.4 mg total) under the tongue every 5 (five) minutes as needed for chest pain.  . Vitamin D, Ergocalciferol, (DRISDOL) 50000 UNITS CAPS capsule Take 50,000 Units by mouth every 30 (thirty) days.  .  [DISCONTINUED] diphenhydrAMINE (BENADRYL) 25 MG tablet Take 25 mg by mouth at bedtime as needed. For allergy symptoms   No current facility-administered medications on file prior to visit.    Review of Systems Per HPI unless specifically indicated above    Objective:    BP 126/74  Pulse 95  Temp(Src) 98.8 F (37.1 C) (Oral)  Resp 16  Wt 122 lb 12 oz (55.679 kg)  SpO2 97%  Physical Exam  Nursing note and vitals reviewed. Constitutional: She appears well-developed and well-nourished. No distress.  Abdominal: Soft. Normal appearance and bowel sounds are normal. She exhibits no distension and no mass. There is no hepatosplenomegaly. There is no tenderness. There is no rigidity, no rebound, no guarding, no CVA tenderness and negative Murphy's sign.  Musculoskeletal: She exhibits no edema.  Skin: Skin is warm and dry. No rash noted.   Results for orders placed in visit on 01/11/14  CBC      Result Value Ref Range   HGB 11.8    PTH, INTACT      Result Value Ref Range   PTH 80    VITAMIN D 25 HYDROXY      Result Value Ref Range   Vit D, 25-Hydroxy 17.6    COMPREHENSIVE METABOLIC PANEL      Result Value Ref Range   Creat 1.49     GFR, Est Non African American 36     Sodium 139  137 - 147 mmol/L  Potassium 4.9     Albumin 4.1     Phosphorus 3.3  2.5 - 4.9 mg/dL      Assessment & Plan:   Problem List Items Addressed This Visit   UTI (urinary tract infection) - Primary     sxs and UA consistent with UTI - start cipro 250mg  bid x 5 days. UCx sent today. Renally dosed cipro. Advised update if sxs persist or worsen.    Relevant Medications      ciprofloxacin (CIPRO) tablet   Other Relevant Orders      Urine culture   Abnormal drug screen     Reviewed abnormal UDS - pt denies taking these other benzos. States she's allergic to amitriptyline. Endorses compliance with current regimen of methadone and xanax. Discussed need for more frequent monitoring given abnormal UDS.  Pt/husband agree.     Other Visit Diagnoses   Need for prophylactic vaccination and inoculation against influenza        Relevant Orders       Flu Vaccine QUAD 36+ mos PF IM (Fluarix Quad PF) (Completed)        Follow up plan: Return if symptoms worsen or fail to improve.

## 2014-02-18 NOTE — Assessment & Plan Note (Signed)
Reviewed abnormal UDS - pt denies taking these other benzos. States she's allergic to amitriptyline. Endorses compliance with current regimen of methadone and xanax. Discussed need for more frequent monitoring given abnormal UDS. Pt/husband agree.

## 2014-02-21 LAB — URINE CULTURE

## 2014-02-22 ENCOUNTER — Other Ambulatory Visit: Payer: Self-pay | Admitting: Family Medicine

## 2014-02-25 ENCOUNTER — Ambulatory Visit: Payer: Medicare Other | Admitting: Family Medicine

## 2014-02-25 DIAGNOSIS — Z0289 Encounter for other administrative examinations: Secondary | ICD-10-CM

## 2014-03-18 ENCOUNTER — Other Ambulatory Visit: Payer: Self-pay | Admitting: Family Medicine

## 2014-03-18 NOTE — Telephone Encounter (Signed)
Ok to refill 

## 2014-03-19 NOTE — Telephone Encounter (Signed)
Rx called in as directed. Patient's husband notified and Rx placed up front for pick up.

## 2014-03-19 NOTE — Telephone Encounter (Signed)
methadone printed. plz phone in xanax.

## 2014-03-25 ENCOUNTER — Telehealth: Payer: Self-pay | Admitting: Family Medicine

## 2014-03-25 NOTE — Telephone Encounter (Signed)
Noted  

## 2014-03-25 NOTE — Telephone Encounter (Signed)
Patient Information:  Caller Name: Ron  Phone: [phone]  Patient: [name] [name], [name] [name]  Gender: Female  DOB: 1945/09/25  Age: 68 Years  PCP: [facility])  Office Follow Up:  Does the office need to follow up with this patient?: No  Instructions For The Office: N/A  RN Note:  No appts. available at the [facility] or the [facility] [employer] [facility] location. Will refer to the [facility] in [place], [place] which the husband knows the location of.  Symptoms  Reason For Call & Symptoms: Pt. has broken out in widespread rash(03/23/14). Does not look like blisters.Looks like red spots and it burns. Difficult to evaluate over the phone. Rash also in the hair and itches. Pt. says purple color to the rash and the husband says it is red. Not sure if any fever. Pt. seems to be in distress due to the rash.  Reviewed Health History In EMR: Yes  Reviewed Medications In EMR: Yes  Reviewed Allergies In EMR: Yes  Reviewed Surgeries / Procedures: Yes  Date of Onset of Symptoms: 03/23/2014  Guideline(s) Used:  Rash or Redness - Widespread  Disposition Per Guideline:   See Today in Office  Reason For Disposition Reached:   Patient wants to be seen  Advice Given:  Call Back If:   You become worse  Patient Will Follow Care Advice:  YES

## 2014-03-28 ENCOUNTER — Ambulatory Visit (INDEPENDENT_AMBULATORY_CARE_PROVIDER_SITE_OTHER): Payer: Medicare Other | Admitting: Family Medicine

## 2014-03-28 ENCOUNTER — Encounter: Payer: Self-pay | Admitting: Family Medicine

## 2014-03-28 VITALS — BP 122/70 | HR 82 | Temp 98.1°F | Resp 16 | Wt 125.5 lb

## 2014-03-28 DIAGNOSIS — F17200 Nicotine dependence, unspecified, uncomplicated: Secondary | ICD-10-CM

## 2014-03-28 DIAGNOSIS — Z720 Tobacco use: Secondary | ICD-10-CM

## 2014-03-28 DIAGNOSIS — R21 Rash and other nonspecific skin eruption: Secondary | ICD-10-CM | POA: Insufficient documentation

## 2014-03-28 NOTE — Assessment & Plan Note (Signed)
Continues to decrease - down to 1/4 ppd.

## 2014-03-28 NOTE — Progress Notes (Signed)
BP 122/70 mmHg  Pulse 82  Temp(Src) 98.1 F (36.7 C) (Oral)  Resp 16  Wt 125 lb 8 oz (56.926 kg)  SpO2 93%   CC: recent shingles dx? Subjective:    Patient ID: [name] [name], female    DOB: January 13, 1946, 68 y.o.   MRN: [id number]  HPI: [name] [name] is a 68 y.o. female presenting on 03/28/2014 for Rash   Last Friday broke out in itchy rash throughout body. Had sore throat and fever to 101 prior to rash. Unable to get in until today - not evaluated at [facility]. Rash has largely resolved. Was worried about shingles. Actually today rash has resolved. Did not try benadryl, did not take tylenol. Advised ok to take tylenol.   No new soaps, detergents, shampoos or lotions. No new foods. No new meds recently.  Relevant past medical, surgical, family and social history reviewed and updated as indicated.  Allergies and medications reviewed and updated. Current Outpatient Prescriptions on File Prior to Visit  Medication Sig  . albuterol (PROVENTIL HFA;VENTOLIN HFA) 108 (90 BASE) MCG/ACT inhaler Inhale 2 puffs into the lungs every 6 (six) hours as needed for wheezing.  Marland Kitchen ALPRAZolam (XANAX) 1 MG tablet TAKE ONE (1) TABLET FOUR TIMES EACH DAY AS NEEDED  . aspirin 81 MG chewable tablet Chew 81 mg by mouth 2 (two) times daily.  . Calcium Carb-Cholecalciferol (CALCIUM-VITAMIN D) 600-400 MG-UNIT TABS Take 1 tablet by mouth 2 (two) times daily.  . furosemide (LASIX) 20 MG tablet Take 1 tablet (20 mg total) by mouth daily as needed (swelling).  . gabapentin (NEURONTIN) 100 MG capsule Take 3 capsules (300 mg total) by mouth 2 (two) times daily.  . methadone (DOLOPHINE) 10 MG tablet TAKE 3 TO 4 TABLETS EVERY 8 HOURS  . Multiple Vitamin (MULTIVITAMIN WITH MINERALS) TABS Take 1 tablet by mouth daily.  . nitroGLYCERIN (NITROSTAT) 0.4 MG SL tablet Place 1 tablet (0.4 mg total) under the tongue every 5 (five) minutes as needed for chest pain.  . Vitamin D, Ergocalciferol, (DRISDOL) 50000 UNITS CAPS capsule Take  50,000 Units by mouth every 30 (thirty) days.  . [DISCONTINUED] diphenhydrAMINE (BENADRYL) 25 MG tablet Take 25 mg by mouth at bedtime as needed. For allergy symptoms   No current facility-administered medications on file prior to visit.    Review of Systems Per HPI unless specifically indicated above    Objective:    BP 122/70 mmHg  Pulse 82  Temp(Src) 98.1 F (36.7 C) (Oral)  Resp 16  Wt 125 lb 8 oz (56.926 kg)  SpO2 93%  Physical Exam  Constitutional: She appears well-developed and well-nourished. No distress.  Walks with cane  HENT:  Mouth/Throat: Oropharynx is clear and moist. No oropharyngeal exudate.  Cardiovascular: Normal rate, regular rhythm, normal heart sounds and intact distal pulses.   No murmur heard. Pulmonary/Chest: Effort normal and breath sounds normal. No respiratory distress. She has no wheezes. She has no rales.  Musculoskeletal: She exhibits no edema.  Skin: Skin is warm and dry. Rash noted.  Faint few pruritic papular spots on L arm and abdomen  Psychiatric: She has a normal mood and affect.  Nursing note and vitals reviewed.      Assessment & Plan:   Problem List Items Addressed This Visit    Smoker    Continues to decrease - down to 1/4 ppd.    Rash, skin - Primary    Given preceding sore throat and fever (now fever free) possible viral exanthem,  but given degree of pruritis less likely. ?bug bites (chiggers from yard). Not consistent with shingles, doubt drug rash. Discussed with patient - anticipate self limited process, ok to use benadryl prn itching. Update if persistent rash. Pt agrees.        Follow up plan: Return if symptoms worsen or fail to improve.

## 2014-03-28 NOTE — Patient Instructions (Signed)
I wonder about viral illness given preceding sore throat and fever. Ok to take tylenol 500mg  for discomfort, ok to take benadryl 25mg  for itching but this will make you sleepy. This should improve with time. Good to see you today, call us with quesitons.

## 2014-03-28 NOTE — Assessment & Plan Note (Signed)
Given preceding sore throat and fever (now fever free) possible viral exanthem, but given degree of pruritis less likely. ?bug bites (chiggers from yard). Not consistent with shingles, doubt drug rash. Discussed with patient - anticipate self limited process, ok to use benadryl prn itching. Update if persistent rash. Pt agrees.

## 2014-03-28 NOTE — Progress Notes (Signed)
Pre visit review using our clinic review tool, if applicable. No additional management support is needed unless otherwise documented below in the visit note. 

## 2014-04-15 ENCOUNTER — Ambulatory Visit (INDEPENDENT_AMBULATORY_CARE_PROVIDER_SITE_OTHER): Payer: Medicare Other | Admitting: Family Medicine

## 2014-04-15 ENCOUNTER — Encounter: Payer: Self-pay | Admitting: *Deleted

## 2014-04-15 ENCOUNTER — Encounter: Payer: Self-pay | Admitting: Family Medicine

## 2014-04-15 VITALS — BP 138/86 | HR 84 | Temp 98.2°F | Wt 124.8 lb

## 2014-04-15 DIAGNOSIS — R202 Paresthesia of skin: Secondary | ICD-10-CM

## 2014-04-15 DIAGNOSIS — R892 Abnormal level of other drugs, medicaments and biological substances in specimens from other organs, systems and tissues: Secondary | ICD-10-CM

## 2014-04-15 MED ORDER — ALPRAZOLAM 1 MG PO TABS: ORAL_TABLET | ORAL | Status: AC

## 2014-04-15 MED ORDER — VITAMIN D (ERGOCALCIFEROL) 1.25 MG (50000 UNIT) PO CAPS: 50000.0000 [IU] | ORAL_CAPSULE | ORAL | Status: AC

## 2014-04-15 MED ORDER — METHADONE HCL 10 MG PO TABS: ORAL_TABLET | ORAL | Status: AC

## 2014-04-15 NOTE — Patient Instructions (Addendum)
Urine drug screen today. For R arm numbness - start B complex vitamin for nerve health.  If persistent or worsening numbness/weakness let me know.

## 2014-04-15 NOTE — Progress Notes (Signed)
Pre visit review using our clinic review tool, if applicable. No additional management support is needed unless otherwise documented below in the visit note. 

## 2014-04-15 NOTE — Progress Notes (Addendum)
BP 138/86 mmHg  Pulse 84  Temp(Src) 98.2 F (36.8 C) (Oral)  Wt 124 lb 12 oz (56.586 kg)   CC: arm numbness  Subjective:    Patient ID: [name] [name], female    DOB: 14-Feb-1946, 68 y.o.   MRN: [id number]  HPI: [id number] BO is a 68 y.o. female presenting on 04/15/2014 for Numbness   Will repeat UDS today for h/o abnormal UDS 01/2014.   3 month history of R pinky numbness that radiates to R elbow. Also endorses shoulder pain when lifting arm overhead. Denies worsening neck pain. Endorses some arm weakness and has dropped objects recently.   Does not remember inciting trauma or injury although she does fall frequently.  Not at all interested in NCS.   Worsening knee pain.   Relevant past medical, surgical, family and social history reviewed and updated as indicated.  Allergies and medications reviewed and updated. Current Outpatient Prescriptions on File Prior to Visit  Medication Sig  . albuterol (PROVENTIL HFA;VENTOLIN HFA) 108 (90 BASE) MCG/ACT inhaler Inhale 2 puffs into the lungs every 6 (six) hours as needed for wheezing.  Marland Kitchen aspirin 81 MG chewable tablet Chew 81 mg by mouth 2 (two) times daily.  . Calcium Carb-Cholecalciferol (CALCIUM-VITAMIN D) 600-400 MG-UNIT TABS Take 1 tablet by mouth 2 (two) times daily.  . furosemide (LASIX) 20 MG tablet Take 1 tablet (20 mg total) by mouth daily as needed (swelling).  . gabapentin (NEURONTIN) 100 MG capsule Take 3 capsules (300 mg total) by mouth 2 (two) times daily.  . Multiple Vitamin (MULTIVITAMIN WITH MINERALS) TABS Take 1 tablet by mouth daily.  . nitroGLYCERIN (NITROSTAT) 0.4 MG SL tablet Place 1 tablet (0.4 mg total) under the tongue every 5 (five) minutes as needed for chest pain.  . [DISCONTINUED] diphenhydrAMINE (BENADRYL) 25 MG tablet Take 25 mg by mouth at bedtime as needed. For allergy symptoms   No current facility-administered medications on file prior to visit.   Past Medical History  Diagnosis Date  .  Arachnoiditis 1970    due to her Pantopaque myelograms in the 1970s, evidence of this on CT scan 11/2002  . Hypertension   . MVP (mitral valve prolapse)     h/o scarlet fever  . COPD (chronic obstructive pulmonary disease) 2003    by CXR, chronic bronchitis  . HLD (hyperlipidemia)   . Colon polyps   . H/O: CVA (cerebrovascular accident)     TIA  . Urine incontinence   . Anxiety     severe with panic attacks  . CAD (coronary artery disease) 2002    catheterization: 60% RCA stenosis, LAD 30%, L circ 25%  . Hydradenitis 2001    chronic, perineal s/p surgeries ([facility]) ?sweet's syndrome  . Psychogenic syncope 2003  . Orthostatic hypotension 2002  . DDD (degenerative disc disease), lumbar 2003    mult HNP with radiculopathy s/p mult surgeries and fusion  . Chronic pain syndrome     high dose methadone since hospitalization 05/16/2002, has not tolerated lyrica, cymbalta, zanaflex  . Chronic back pain     failed ESI and sympathetic blocks  . Osteoarthritis of both knees     severe  . Aspiration pneumonia 02/2012    after overdose  . 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 Adenomatous polyp 04/2008    rec rpt colon 3 yrs ([facility])  . 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 Foot fracture, right 2012    ?nonunion  . Angiomyolipoma of right kidney 2014    41mm R angiomyolipoma  . Smoker 1969  . Complication of anesthesia     DIFFICULT TO WAKE UP  . Abnormal drug screen 01/2014    inapprop pos temazepam, lorazepam, oxazepam     Review of Systems Per HPI unless specifically indicated above    Objective:    BP 138/86 mmHg  Pulse 84  Temp(Src) 98.2 F (36.8 C) (Oral)  Wt 124 lb 12 oz (56.586 kg)  Physical Exam  Constitutional: She is oriented to person, place, and time. She appears well-developed and well-nourished. No distress.  HENT:    Head: Normocephalic and atraumatic.  Mouth/Throat: Oropharynx is clear and moist. No oropharyngeal exudate.  Musculoskeletal: She exhibits no edema.  Minimal midline cervical neck pain, no paracervical mm pain or spasm Full active ROM at shoulders with some pain on right No deformity or pain to palpation at shoulders Tender to palpation around elbow but FROM present on right 2+ rad pulses  Neurological: She is alert and oriented to person, place, and time. She has normal strength. A sensory deficit is present. She exhibits normal muscle tone.  Reflex Scores:      Bicep reflexes are 1+ on the right side and 2+ on the left side.      Brachioradialis reflexes are 2+ on the right side and 2+ on the left side. Endorsed decreased sensation to light touch and temperature at entire 5th digit on right as well as majority of distal forearm. 5/5 strength BUE  Skin: Skin is warm and dry. Bruising noted. No rash noted.  Healing bruise lateral proximal forearm on right  Psychiatric: She has a normal mood and affect.  Nursing note and vitals reviewed.      Assessment & Plan:   Problem List Items Addressed This Visit    Arm paresthesia, right - Primary    Unclear etiology although anticipate related to bruise noted on right forearm - possible ulnar nerve injury after a fall over last few months. Doubt stroke etiology as no other neurological findings on history or exam. Slightly decreased biceps reflex on right and numbness endorsed on exam. Exam does not follow any specific mononeuropathy. I suggested she start B complex. Avoid NSAIDs 2/2 CKD. Update if persistent trouble, consider checking B12. Discussed NCS as option for diagnosis, pt currently very strongly against this. No results found for: VITAMINB12    Abnormal drug screen    Recheck UDS today. H/o abnormally positive to several benzo's (01/2014)        Follow up plan: Return for follow up visit.

## 2014-04-16 DIAGNOSIS — R202 Paresthesia of skin: Secondary | ICD-10-CM | POA: Insufficient documentation

## 2014-04-16 NOTE — Assessment & Plan Note (Signed)
Recheck UDS today. H/o abnormally positive to several benzo's (01/2014)

## 2014-04-16 NOTE — Assessment & Plan Note (Addendum)
Unclear etiology although anticipate related to bruise noted on right forearm - possible ulnar nerve injury after a fall over last few months. Doubt stroke etiology as no other neurological findings on history or exam. Slightly decreased biceps reflex on right and numbness endorsed on exam. Exam does not follow any specific mononeuropathy. I suggested she start B complex. Avoid NSAIDs 2/2 CKD. Update if persistent trouble, consider checking B12. Discussed NCS as option for diagnosis, pt currently very strongly against this. No results found for: VITAMINB12

## 2014-04-17 ENCOUNTER — Telehealth: Payer: Self-pay | Admitting: Family Medicine

## 2014-04-17 ENCOUNTER — Other Ambulatory Visit: Payer: Self-pay | Admitting: Emergency Medicine

## 2014-04-17 NOTE — Telephone Encounter (Signed)
Late entry - spoke with husband this afternoon around 1pm. He is at hospital with wife. Patient currently in ICU after suffering massive basal ischemic stroke (bilateral cerebellar and PCA infarcts, mid and upper brainstem infarction) along with acute PE (large thrombosis R V4 segment, mid and upper basilar R P1 lung segment). Discussed grave prognosis. Currently intubated. Daughter present as well. They may withdraw life support on Friday. Expressed my condolences advised to call us if any questions.

## 2014-04-23 DEATH — deceased

## 2014-04-24 ENCOUNTER — Telehealth: Payer: Self-pay

## 2014-04-24 NOTE — Telephone Encounter (Signed)
[name] [name] request Dr Danise Mina to call him; pt passed away over the weekend and [name] Ethridge appreciates all of Dr Synthia Innocent kindness and wanted to talk.

## 2014-04-25 NOTE — Telephone Encounter (Signed)
In your IN box for review.  

## 2014-04-25 NOTE — Telephone Encounter (Signed)
Date of death 11/28 midnight

## 2014-04-25 NOTE — Telephone Encounter (Signed)
Called husband.  Expressed my condolences. Unclear date of death, likely 10-May-2023. Maudie Mercury can we get death summary from hospital? Thanks.

## 2014-12-05 IMAGING — CR DG CHEST 1V PORT
1 series · 1 of 1 positions shown · non-contrast
Comparison: Chest radiograph 05/31/2012

CLINICAL DATA: Syncope

PORTABLE CHEST - 1 VIEW

[AP]
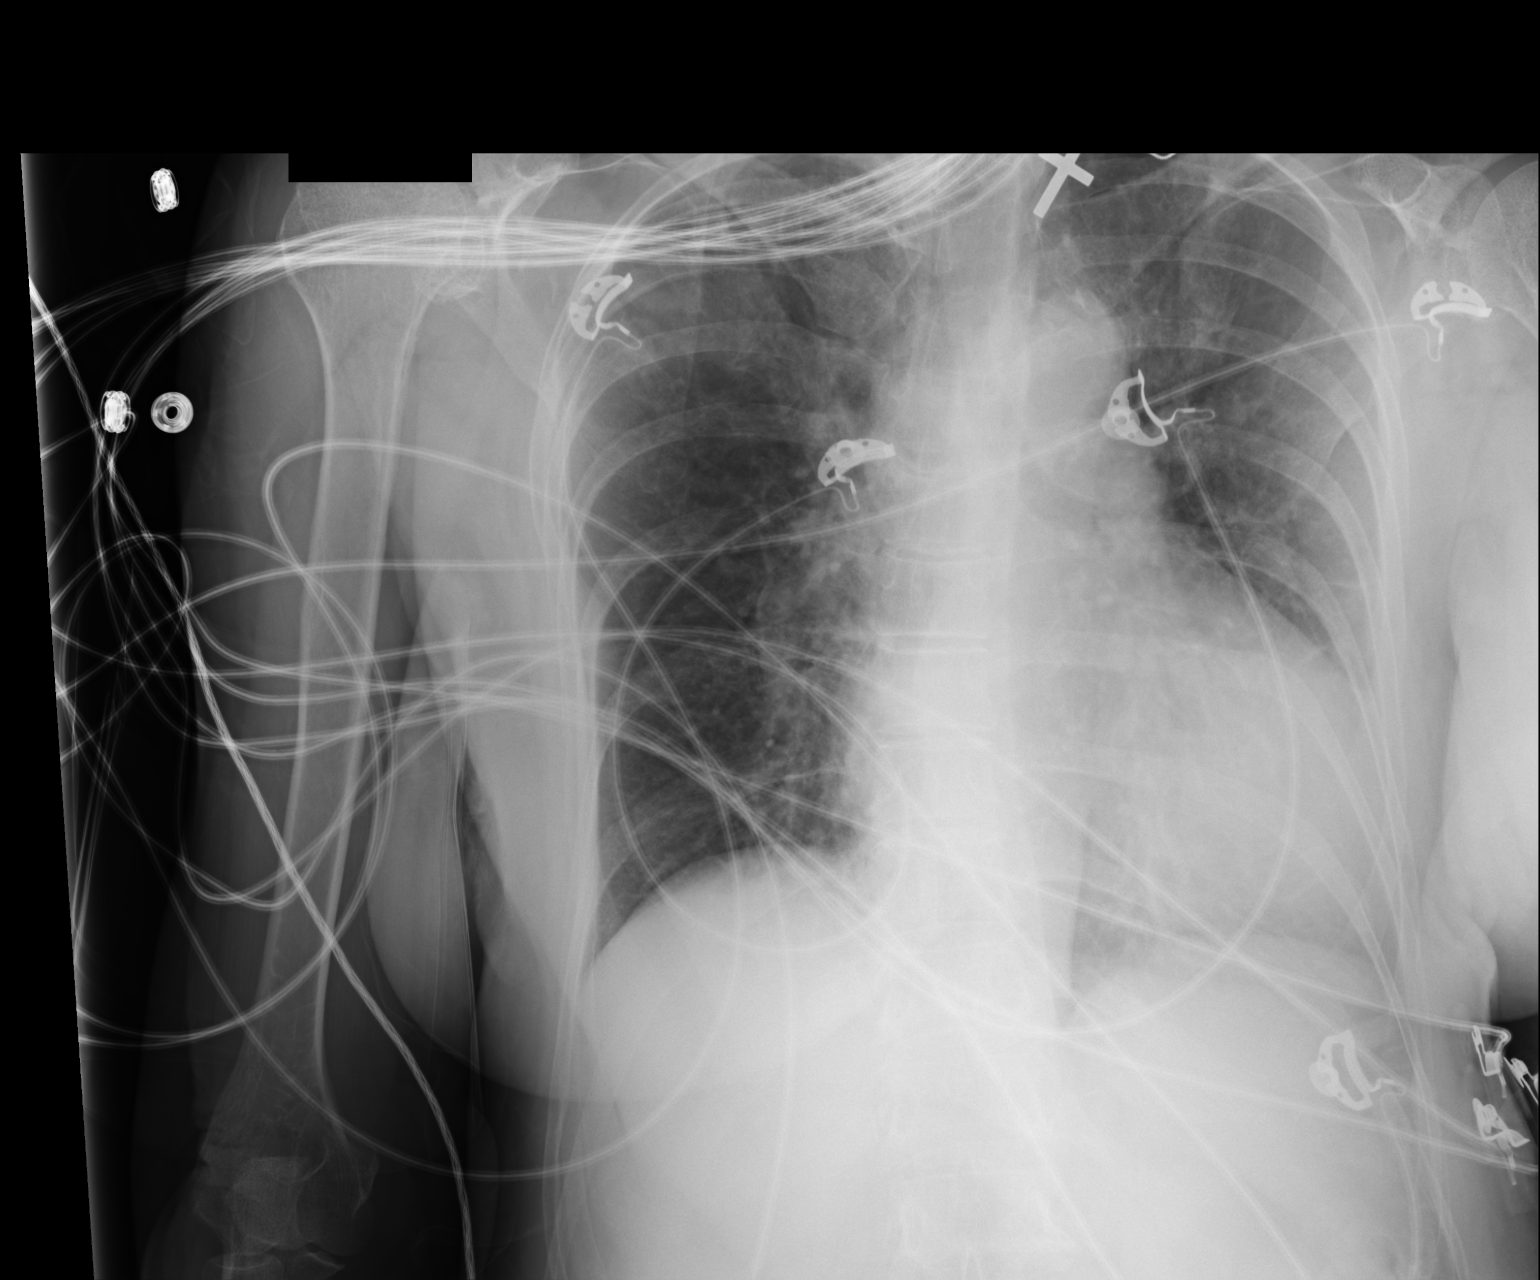

[1 of 1 positions shown; findings below may reference images not displayed]

FINDINGS: Stable enlarged cardiac silhouette.  No effusion,
infiltrate, or pneumothorax.  No aggressive osseous lesions.
IMPRESSION: Cardiomegaly without acute cardiopulmonary findings.

## 2014-12-05 IMAGING — CT CT HEAD W/O CM
4 of 6 series · 17 of 40 positions shown, 19 images · non-contrast
Comparison: Head CT 04/23/2012

CT HEAD

CLINICAL DATA: Fall, loss of consciousness,

CT HEAD WITHOUT CONTRAST
CT CERVICAL SPINE WITHOUT CONTRAST
TECHNIQUE: Multidetector CT imaging of the head and cervical spine
was performed following the standard protocol without intravenous
contrast.  Multiplanar CT image reconstructions of the cervical
spine were also generated.

[Series 2: brain · axial · 0.47mm/px · z∈[-85,-38]mm · 2 of 36 slices shown]
[im 9/36  brain]
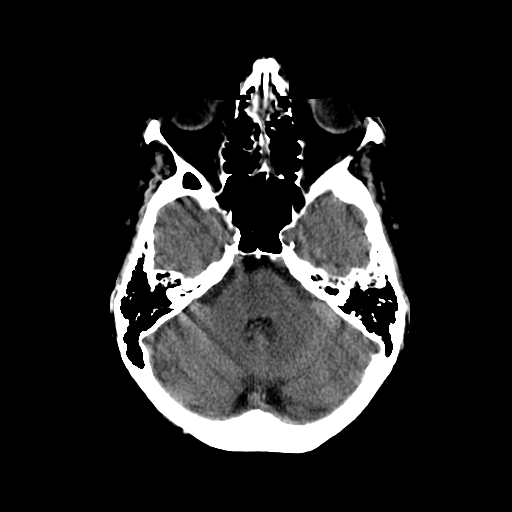
[im 18/36  brain]
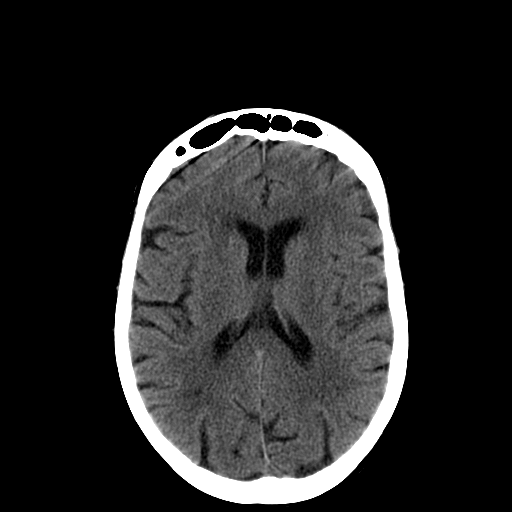

[Series 3: recon 2: brain · axial · 0.47mm/px · z∈[-108,+35]mm · 7 of 72 slices shown, 9 images]
[im 9/72  brain]
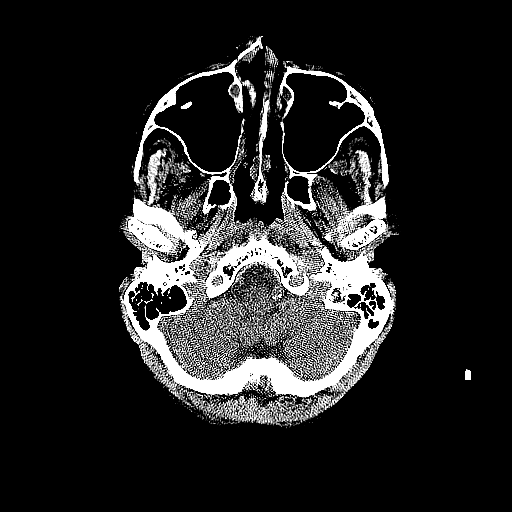
[im 9/72  bone]
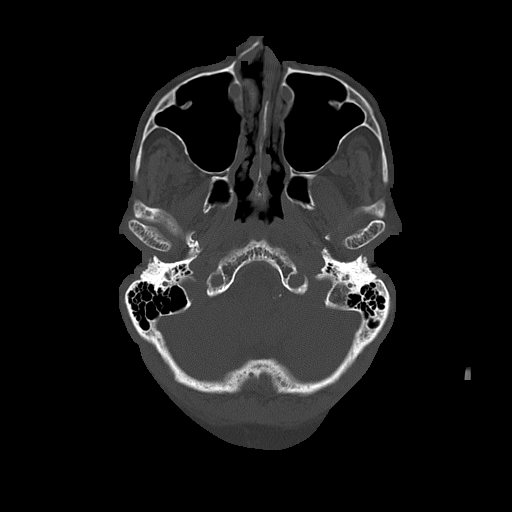
[im 18/72  brain]
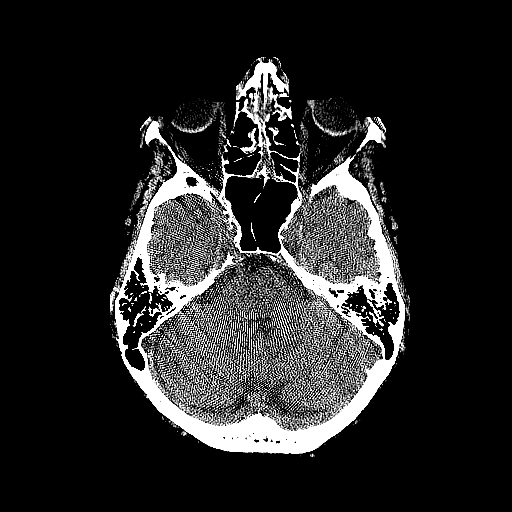
[im 27/72  brain]
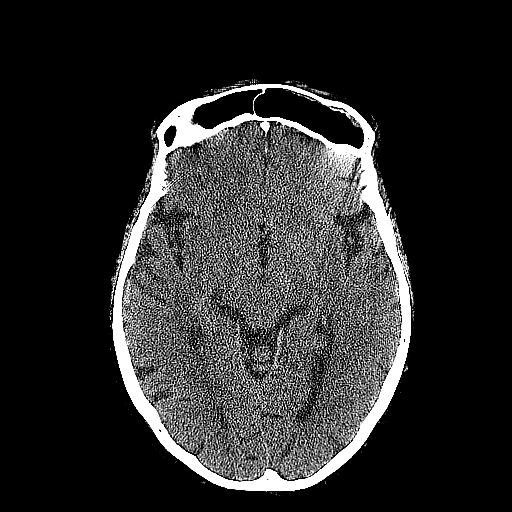
[im 36/72  brain]
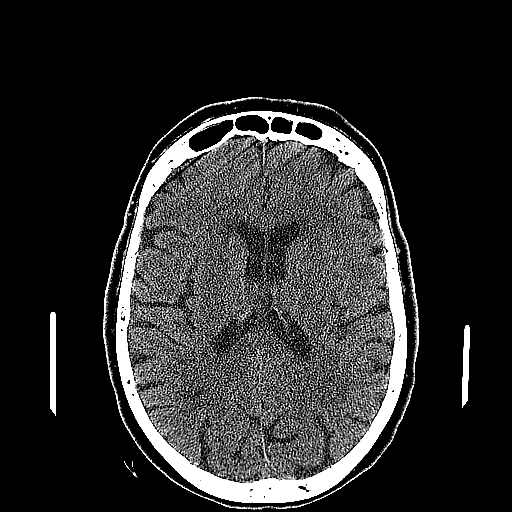
[im 45/72  brain]
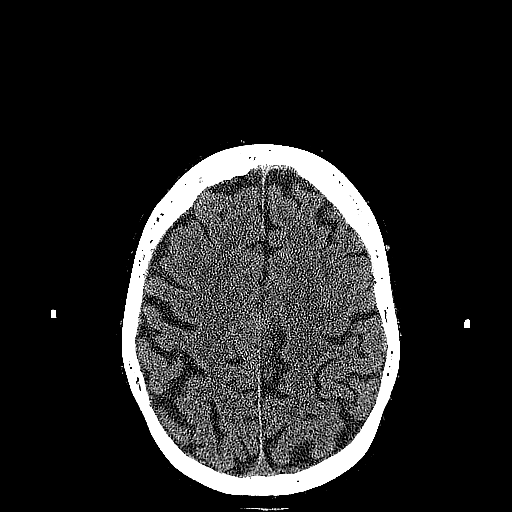
[im 45/72  bone]
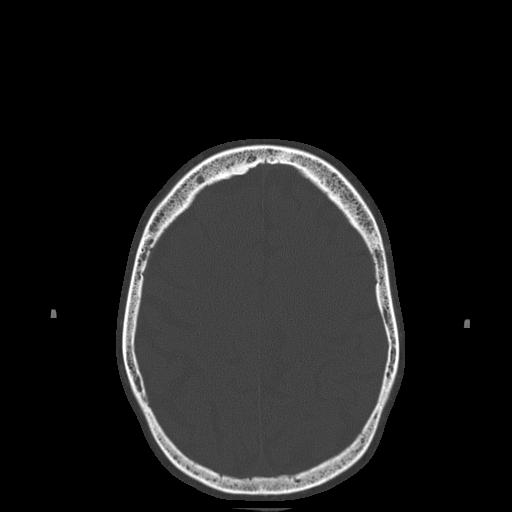
[im 54/72  brain]
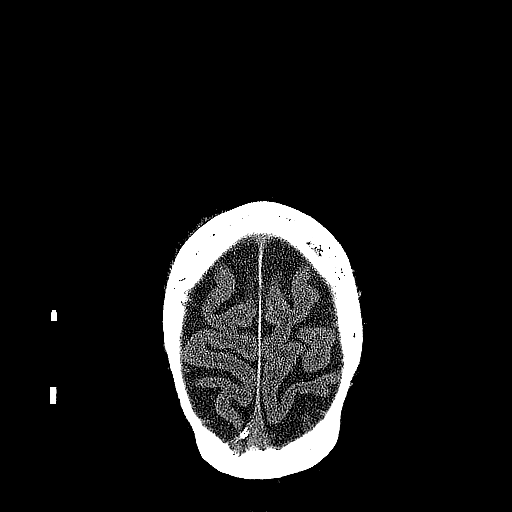
[im 63/72  brain]
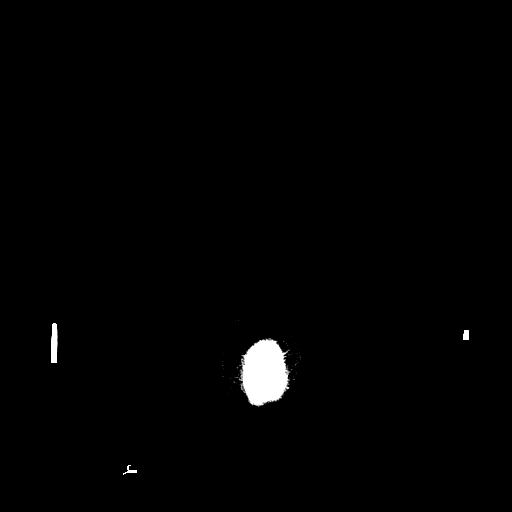

[Series 600: orthogs · axial · 0.36mm/px · z∈[-302,-206]mm · 5 of 56 slices shown]
[im 10/56  brain]
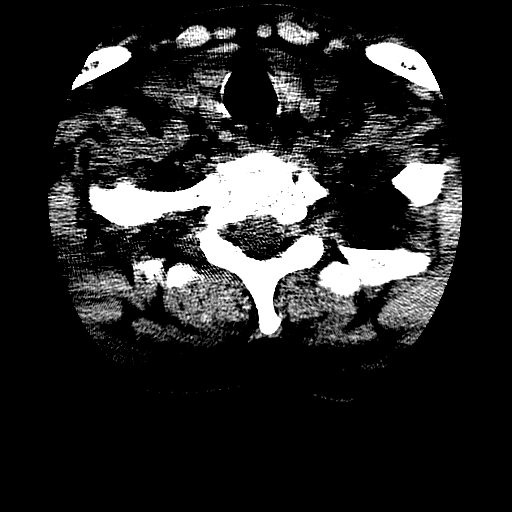
[im 19/56  brain]
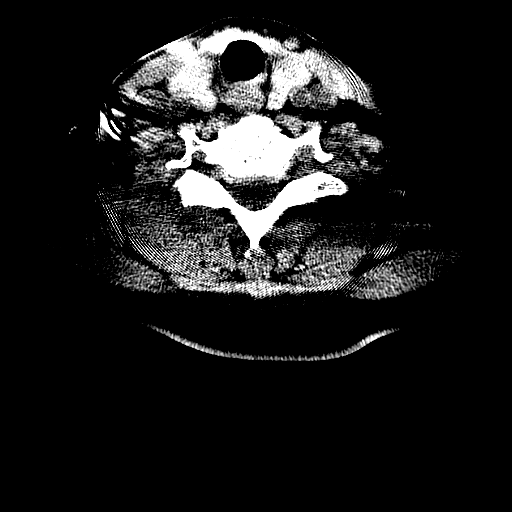
[im 28/56  brain]
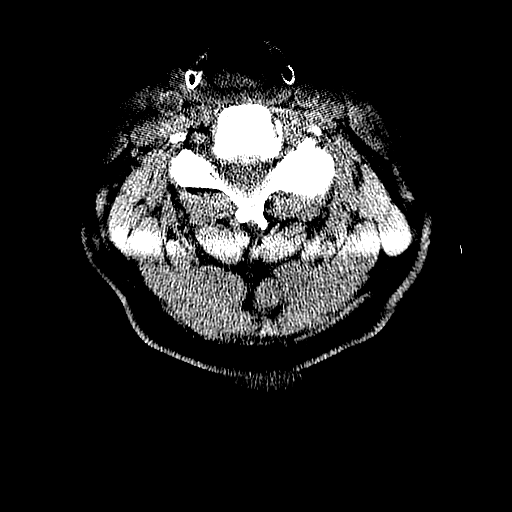
[im 37/56  brain]
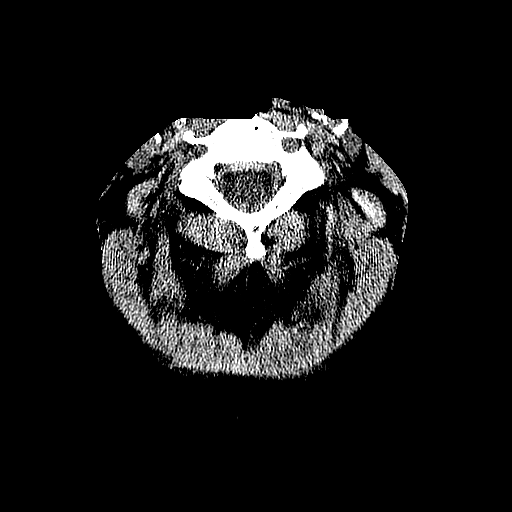
[im 46/56  brain]
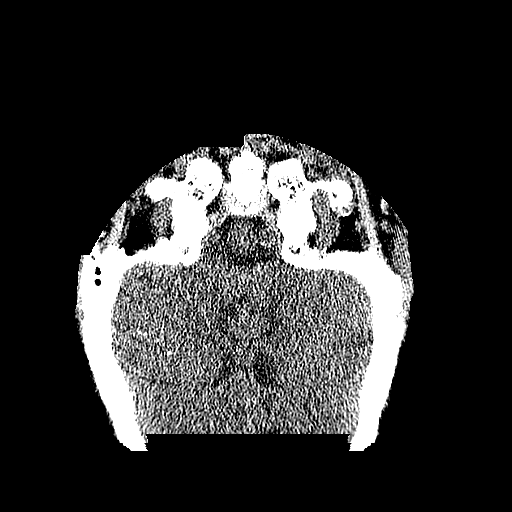

[Series 601: cor · coronal · 0.36mm/px · 3 of 27 slices shown]
[im 7/27  brain]
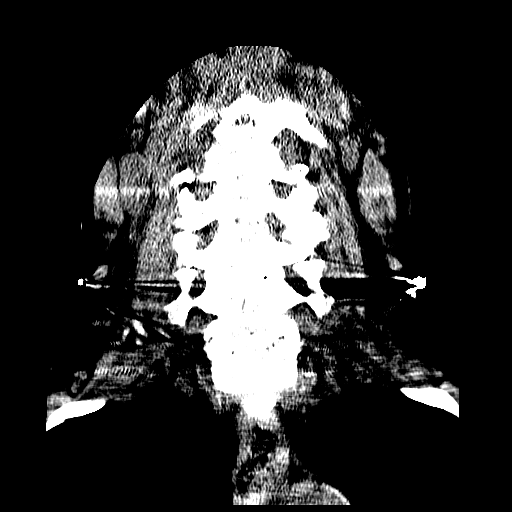
[im 14/27  brain]
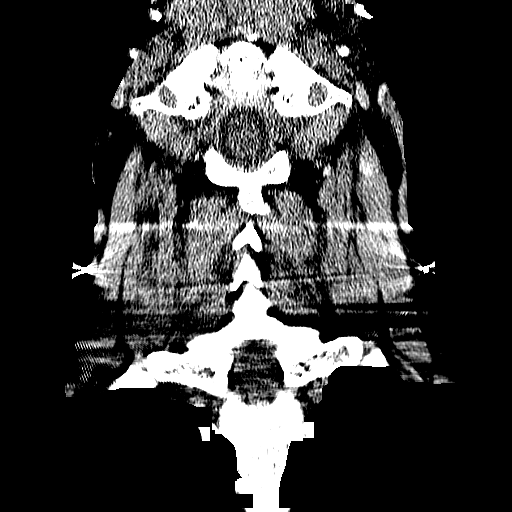
[im 20/27  brain]
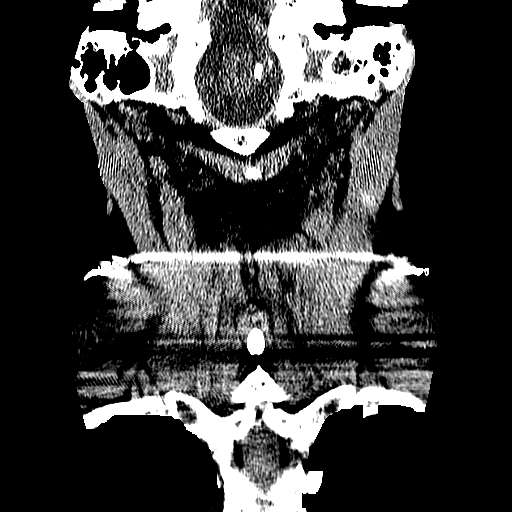

[17 of 40 positions shown; findings below may reference images not displayed]

FINDINGS: No intracranial hemorrhage.  No parenchymal contusion.
No midline shift or mass effect.  Basilar cisterns are patent. No
skull base fracture.  No fluid in the paranasal sinuses or mastoid
air cells.
IMPRESSION: No intracranial trauma.

CT CERVICAL SPINE
FINDINGS: No prevertebral soft tissue swelling.  Normal alignment
of cervical vertebral bodies.  No loss of vertebral body height.
Normal facet articulation.  Normal craniocervical junction.

No evidence epidural or paraspinal hematoma.

There is mild joint space narrowing and endplate osteophytosis at
C5-C6.
IMPRESSION: No evidence of acute cervical spine fracture.

Mild spondylosis.

## 2014-12-08 IMAGING — CR DG CHEST 1V PORT
1 series · 1 of 1 positions shown · non-contrast
Comparison: Chest x-ray 07/27/2012.

CLINICAL DATA: Rule out basilar infiltrate.

PORTABLE CHEST - 1 VIEW

[AP]
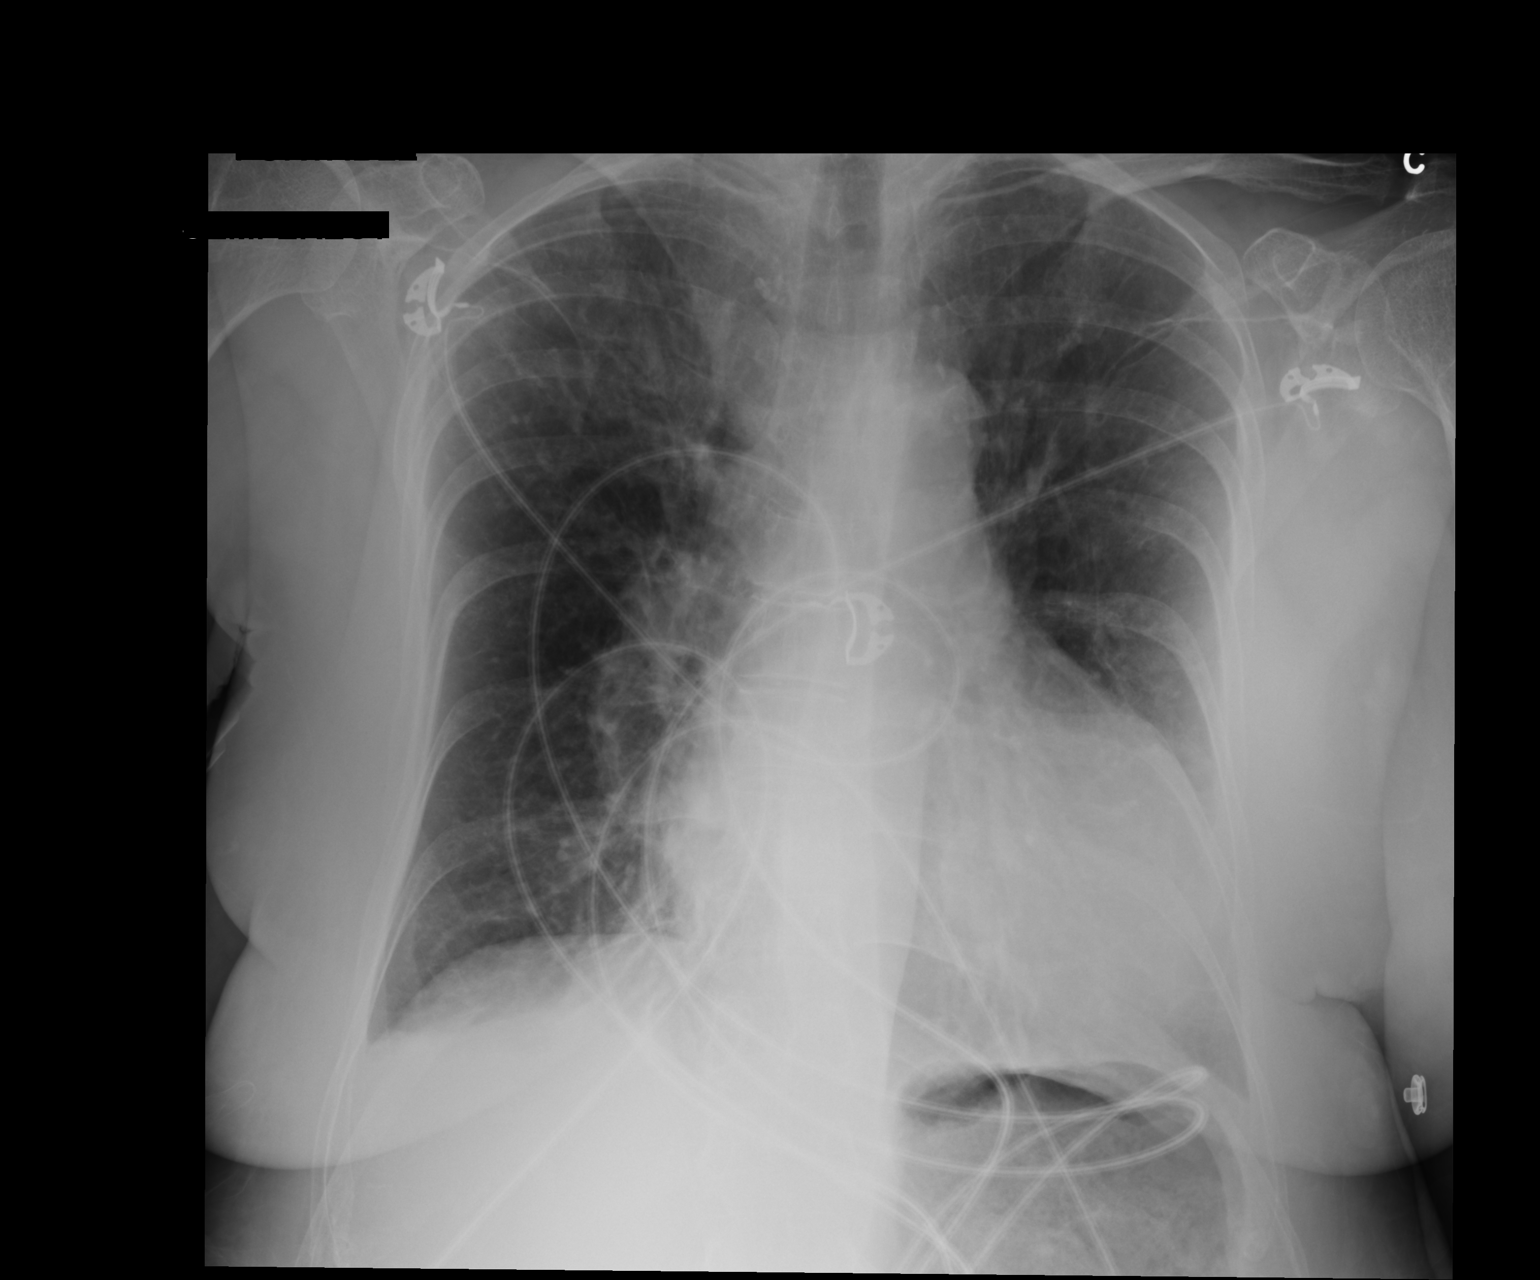

[1 of 1 positions shown; findings below may reference images not displayed]

FINDINGS: Lungs are well expanded, without consolidative airspace
disease or definite pleural effusions.  There are some patchy
chronically increased interstitial opacities in the lung apices and
the medial aspect of the lung bases which is similar to prior
examinations.  No evidence of pulmonary edema.  Mild cardiomegaly
is unchanged.  Upper mediastinal contours are within normal limits.
Atherosclerosis of the thoracic aorta.
IMPRESSION: 1.  No definite radiographic evidence of acute cardiopulmonary
disease.
2.  Chronically increased interstitial markings, as above, of
uncertain etiology and significance.
3.  Mild cardiomegaly.
4.  Atherosclerosis.
